# Patient Record
Sex: Female | Born: 1935 | Race: White | Hispanic: No | Smoking: Former smoker
Health system: Southern US, Community
[De-identification: ages and names within clinical notes are randomized; demographics above are authoritative.]

## PROBLEM LIST (undated history)

## (undated) DIAGNOSIS — I10 Essential (primary) hypertension: Secondary | ICD-10-CM

## (undated) DIAGNOSIS — E785 Hyperlipidemia, unspecified: Secondary | ICD-10-CM

## (undated) DIAGNOSIS — N390 Urinary tract infection, site not specified: Secondary | ICD-10-CM

## (undated) DIAGNOSIS — K219 Gastro-esophageal reflux disease without esophagitis: Secondary | ICD-10-CM

## (undated) DIAGNOSIS — E876 Hypokalemia: Secondary | ICD-10-CM

## (undated) DIAGNOSIS — M4804 Spinal stenosis, thoracic region: Secondary | ICD-10-CM

## (undated) DIAGNOSIS — I482 Chronic atrial fibrillation, unspecified: Secondary | ICD-10-CM

## (undated) DIAGNOSIS — R29898 Other symptoms and signs involving the musculoskeletal system: Secondary | ICD-10-CM

## (undated) DIAGNOSIS — K5900 Constipation, unspecified: Secondary | ICD-10-CM

## (undated) DIAGNOSIS — D62 Acute posthemorrhagic anemia: Secondary | ICD-10-CM

## (undated) DIAGNOSIS — L8990 Pressure ulcer of unspecified site, unspecified stage: Secondary | ICD-10-CM

## (undated) DIAGNOSIS — R600 Localized edema: Secondary | ICD-10-CM

## (undated) DIAGNOSIS — N3281 Overactive bladder: Secondary | ICD-10-CM

## (undated) DIAGNOSIS — M1990 Unspecified osteoarthritis, unspecified site: Secondary | ICD-10-CM

## (undated) DIAGNOSIS — E119 Type 2 diabetes mellitus without complications: Secondary | ICD-10-CM

## (undated) DIAGNOSIS — E46 Unspecified protein-calorie malnutrition: Secondary | ICD-10-CM

## (undated) HISTORY — PX: CATARACT EXTRACTION: SUR2

## (undated) HISTORY — PX: KNEE SURGERY: SHX244

## (undated) HISTORY — PX: BACK SURGERY: SHX140

## (undated) HISTORY — PX: HIP SURGERY: SHX245

## (undated) HISTORY — DX: Hypokalemia: E87.6

## (undated) HISTORY — DX: Overactive bladder: N32.81

## (undated) HISTORY — DX: Pressure ulcer of unspecified site, unspecified stage: L89.90

## (undated) HISTORY — PX: TONSILLECTOMY: SUR1361

## (undated) HISTORY — PX: CARPAL TUNNEL RELEASE: SHX101

## (undated) HISTORY — DX: Gastro-esophageal reflux disease without esophagitis: K21.9

## (undated) HISTORY — PX: WISDOM TOOTH EXTRACTION: SHX21

## (undated) HISTORY — PX: NECK SURGERY: SHX720

## (undated) HISTORY — DX: Constipation, unspecified: K59.00

## (undated) HISTORY — DX: Localized edema: R60.0

## (undated) HISTORY — DX: Unspecified osteoarthritis, unspecified site: M19.90

## (undated) HISTORY — DX: Hyperlipidemia, unspecified: E78.5

## (undated) HISTORY — DX: Spinal stenosis, thoracic region: M48.04

## (undated) HISTORY — DX: Urinary tract infection, site not specified: N39.0

## (undated) HISTORY — DX: Other symptoms and signs involving the musculoskeletal system: R29.898

## (undated) HISTORY — DX: Chronic atrial fibrillation, unspecified: I48.20

## (undated) HISTORY — DX: Acute posthemorrhagic anemia: D62

## (undated) HISTORY — DX: Type 2 diabetes mellitus without complications: E11.9

## (undated) HISTORY — PX: TOTAL HIP ARTHROPLASTY: SHX124

## (undated) HISTORY — DX: Unspecified protein-calorie malnutrition: E46

---

## 1998-03-11 ENCOUNTER — Other Ambulatory Visit: Admission: RE | Admit: 1998-03-11 | Discharge: 1998-03-11 | Payer: Self-pay | Admitting: Obstetrics and Gynecology

## 1998-05-20 ENCOUNTER — Ambulatory Visit (HOSPITAL_COMMUNITY): Admission: RE | Admit: 1998-05-20 | Discharge: 1998-05-20 | Payer: Self-pay | Admitting: Obstetrics & Gynecology

## 1998-07-30 ENCOUNTER — Encounter: Payer: Self-pay | Admitting: Family Medicine

## 1998-07-30 ENCOUNTER — Ambulatory Visit (HOSPITAL_COMMUNITY): Admission: RE | Admit: 1998-07-30 | Discharge: 1998-07-30 | Payer: Self-pay | Admitting: Family Medicine

## 1999-05-07 ENCOUNTER — Other Ambulatory Visit: Admission: RE | Admit: 1999-05-07 | Discharge: 1999-05-07 | Payer: Self-pay | Admitting: Obstetrics and Gynecology

## 2000-07-06 ENCOUNTER — Encounter: Payer: Self-pay | Admitting: Orthopaedic Surgery

## 2000-07-12 ENCOUNTER — Inpatient Hospital Stay (HOSPITAL_COMMUNITY): Admission: RE | Admit: 2000-07-12 | Discharge: 2000-07-16 | Payer: Self-pay | Admitting: Orthopaedic Surgery

## 2000-10-14 ENCOUNTER — Other Ambulatory Visit: Admission: RE | Admit: 2000-10-14 | Discharge: 2000-10-14 | Payer: Self-pay | Admitting: Obstetrics and Gynecology

## 2000-12-27 ENCOUNTER — Encounter: Payer: Self-pay | Admitting: Orthopaedic Surgery

## 2000-12-27 ENCOUNTER — Encounter: Admission: RE | Admit: 2000-12-27 | Discharge: 2000-12-27 | Payer: Self-pay | Admitting: Orthopaedic Surgery

## 2001-01-18 ENCOUNTER — Encounter: Admission: RE | Admit: 2001-01-18 | Discharge: 2001-01-18 | Payer: Self-pay | Admitting: Orthopaedic Surgery

## 2001-01-18 ENCOUNTER — Encounter: Payer: Self-pay | Admitting: Orthopaedic Surgery

## 2001-02-01 ENCOUNTER — Encounter: Payer: Self-pay | Admitting: Orthopaedic Surgery

## 2001-02-01 ENCOUNTER — Encounter: Admission: RE | Admit: 2001-02-01 | Discharge: 2001-02-01 | Payer: Self-pay | Admitting: Orthopaedic Surgery

## 2001-04-18 ENCOUNTER — Ambulatory Visit (HOSPITAL_COMMUNITY): Admission: RE | Admit: 2001-04-18 | Discharge: 2001-04-18 | Payer: Self-pay | Admitting: Obstetrics and Gynecology

## 2001-04-18 ENCOUNTER — Encounter (INDEPENDENT_AMBULATORY_CARE_PROVIDER_SITE_OTHER): Payer: Self-pay

## 2001-07-04 ENCOUNTER — Encounter: Payer: Self-pay | Admitting: Orthopedic Surgery

## 2001-07-11 ENCOUNTER — Encounter: Payer: Self-pay | Admitting: Orthopedic Surgery

## 2001-07-11 ENCOUNTER — Inpatient Hospital Stay (HOSPITAL_COMMUNITY): Admission: RE | Admit: 2001-07-11 | Discharge: 2001-07-17 | Payer: Self-pay | Admitting: Orthopedic Surgery

## 2002-03-02 ENCOUNTER — Other Ambulatory Visit: Admission: RE | Admit: 2002-03-02 | Discharge: 2002-03-02 | Payer: Self-pay | Admitting: Obstetrics and Gynecology

## 2003-04-22 ENCOUNTER — Other Ambulatory Visit: Admission: RE | Admit: 2003-04-22 | Discharge: 2003-04-22 | Payer: Self-pay | Admitting: Obstetrics and Gynecology

## 2004-04-01 ENCOUNTER — Encounter: Admission: RE | Admit: 2004-04-01 | Discharge: 2004-04-02 | Payer: Self-pay | Admitting: Family Medicine

## 2005-12-08 ENCOUNTER — Encounter: Admission: RE | Admit: 2005-12-08 | Discharge: 2005-12-08 | Payer: Self-pay | Admitting: Specialist

## 2006-10-26 ENCOUNTER — Inpatient Hospital Stay (HOSPITAL_COMMUNITY): Admission: RE | Admit: 2006-10-26 | Discharge: 2006-10-30 | Payer: Self-pay | Admitting: Orthopedic Surgery

## 2006-12-10 ENCOUNTER — Inpatient Hospital Stay (HOSPITAL_COMMUNITY): Admission: EM | Admit: 2006-12-10 | Discharge: 2006-12-11 | Payer: Self-pay | Admitting: Emergency Medicine

## 2007-10-25 ENCOUNTER — Inpatient Hospital Stay (HOSPITAL_COMMUNITY): Admission: RE | Admit: 2007-10-25 | Discharge: 2007-10-29 | Payer: Self-pay | Admitting: Orthopedic Surgery

## 2008-01-01 ENCOUNTER — Ambulatory Visit (HOSPITAL_COMMUNITY): Admission: EM | Admit: 2008-01-01 | Discharge: 2008-01-01 | Payer: Self-pay | Admitting: Emergency Medicine

## 2008-02-28 ENCOUNTER — Ambulatory Visit (HOSPITAL_BASED_OUTPATIENT_CLINIC_OR_DEPARTMENT_OTHER): Admission: RE | Admit: 2008-02-28 | Discharge: 2008-02-28 | Payer: Self-pay | Admitting: *Deleted

## 2008-08-31 ENCOUNTER — Emergency Department (HOSPITAL_COMMUNITY): Admission: EM | Admit: 2008-08-31 | Discharge: 2008-08-31 | Payer: Self-pay | Admitting: Emergency Medicine

## 2008-10-28 ENCOUNTER — Inpatient Hospital Stay (HOSPITAL_COMMUNITY): Admission: RE | Admit: 2008-10-28 | Discharge: 2008-10-31 | Payer: Self-pay | Admitting: Orthopedic Surgery

## 2008-12-31 ENCOUNTER — Ambulatory Visit (HOSPITAL_COMMUNITY): Admission: RE | Admit: 2008-12-31 | Discharge: 2008-12-31 | Payer: Self-pay | Admitting: Orthopedic Surgery

## 2008-12-31 ENCOUNTER — Encounter (INDEPENDENT_AMBULATORY_CARE_PROVIDER_SITE_OTHER): Payer: Self-pay | Admitting: Orthopedic Surgery

## 2008-12-31 ENCOUNTER — Ambulatory Visit: Payer: Self-pay | Admitting: Vascular Surgery

## 2010-01-28 ENCOUNTER — Inpatient Hospital Stay (HOSPITAL_COMMUNITY): Admission: RE | Admit: 2010-01-28 | Discharge: 2010-01-30 | Payer: Self-pay | Admitting: Orthopedic Surgery

## 2010-02-02 ENCOUNTER — Encounter: Payer: Self-pay | Admitting: Internal Medicine

## 2010-07-28 NOTE — Letter (Signed)
Summary: Colonoscopy Letter  [facility]  [address] [place], [place] [address]   Phone: [phone]  Fax: [phone]      February 02, 2010 MRN: [id number]   Latalia Detwiler [address] [place], [place]  [address]   Dear Ms. Sedalia Muta,   According to your medical record, it is time for you to schedule a Colonoscopy. The American Cancer Society recommends this procedure as a method to detect early colon cancer. Patients with a family history of colon cancer, or a personal history of colon polyps or inflammatory bowel disease are at increased risk.  This letter has been generated based on the recommendations made at the time of your procedure. If you feel that in your particular situation this may no longer apply, please contact our office.  Please call our office at [phone] to schedule this appointment or to update your records at your earliest convenience.  Thank you for cooperating with [place] to provide you with the very best care possible.   Sincerely,  Hedwig Morton. Juanda Chance, M.D.  [facility] [phone]

## 2010-09-11 LAB — BASIC METABOLIC PANEL
CO2: 30 mEq/L (ref 19–32)
Chloride: 105 mEq/L (ref 96–112)
Creatinine, Ser: 1.08 mg/dL (ref 0.4–1.2)
GFR calc non Af Amer: 50 mL/min — ABNORMAL LOW (ref >60)
Glucose, Bld: 121 mg/dL — ABNORMAL HIGH (ref 70–99)
Sodium: 141 mEq/L (ref 135–145)

## 2010-09-11 LAB — CBC
MCH: 32.1 pg (ref 26.0–34.0)
MCHC: 34.2 g/dL (ref 30.0–36.0)
Platelets: 186 10*3/uL (ref 150–400)
WBC: 6.6 10*3/uL (ref 4.0–10.5)

## 2010-09-12 LAB — SURGICAL PCR SCREEN
MRSA, PCR: NEGATIVE
Staphylococcus aureus: NEGATIVE

## 2010-09-12 LAB — PROTIME-INR: Prothrombin Time: 13.7 seconds (ref 11.6–15.2)

## 2010-09-12 LAB — COMPREHENSIVE METABOLIC PANEL
Albumin: 3.8 g/dL (ref 3.5–5.2)
Alkaline Phosphatase: 65 U/L (ref 39–117)
Chloride: 107 mEq/L (ref 96–112)
GFR calc non Af Amer: 44 mL/min — ABNORMAL LOW (ref >60)
Glucose, Bld: 136 mg/dL — ABNORMAL HIGH (ref 70–99)
Potassium: 4.4 mEq/L (ref 3.5–5.1)
Total Bilirubin: 1.1 mg/dL (ref 0.3–1.2)

## 2010-09-12 LAB — URINALYSIS, ROUTINE W REFLEX MICROSCOPIC
Bilirubin Urine: NEGATIVE
Hgb urine dipstick: NEGATIVE
Ketones, ur: NEGATIVE mg/dL
Urobilinogen, UA: 0.2 mg/dL (ref 0.0–1.0)

## 2010-09-12 LAB — DIFFERENTIAL
Eosinophils Relative: 3 % (ref 0–5)
Lymphocytes Relative: 23 % (ref 12–46)
Lymphs Abs: 1.3 10*3/uL (ref 0.7–4.0)
Monocytes Absolute: 0.5 10*3/uL (ref 0.1–1.0)

## 2010-09-12 LAB — APTT: aPTT: 29 seconds (ref 24–37)

## 2010-09-12 LAB — CBC
MCH: 32.2 pg (ref 26.0–34.0)
MCHC: 34.6 g/dL (ref 30.0–36.0)
MCV: 93 fL (ref 78.0–100.0)
Platelets: 223 10*3/uL (ref 150–400)
RBC: 4.3 MIL/uL (ref 3.87–5.11)
RDW: 13.9 % (ref 11.5–15.5)

## 2010-09-12 LAB — URINE MICROSCOPIC-ADD ON

## 2010-10-06 LAB — CBC
HCT: 34.6 % — ABNORMAL LOW (ref 36.0–46.0)
HCT: 35.5 % — ABNORMAL LOW (ref 36.0–46.0)
Hemoglobin: 11.7 g/dL — ABNORMAL LOW (ref 12.0–15.0)
Hemoglobin: 12 g/dL (ref 12.0–15.0)
MCHC: 33.9 g/dL (ref 30.0–36.0)
MCV: 94.2 fL (ref 78.0–100.0)
MCV: 94.3 fL (ref 78.0–100.0)
Platelets: 186 10*3/uL (ref 150–400)
Platelets: 189 10*3/uL (ref 150–400)
Platelets: 213 10*3/uL (ref 150–400)
RBC: 3.51 MIL/uL — ABNORMAL LOW (ref 3.87–5.11)
RDW: 13.8 % (ref 11.5–15.5)
RDW: 13.9 % (ref 11.5–15.5)
WBC: 7.7 10*3/uL (ref 4.0–10.5)
WBC: 9.3 10*3/uL (ref 4.0–10.5)

## 2010-10-06 LAB — BASIC METABOLIC PANEL
BUN: 14 mg/dL (ref 6–23)
BUN: 16 mg/dL (ref 6–23)
CO2: 32 mEq/L (ref 19–32)
Calcium: 8.4 mg/dL (ref 8.4–10.5)
Chloride: 100 mEq/L (ref 96–112)
GFR calc non Af Amer: 47 mL/min — ABNORMAL LOW (ref >60)
GFR calc non Af Amer: 49 mL/min — ABNORMAL LOW (ref >60)
Glucose, Bld: 136 mg/dL — ABNORMAL HIGH (ref 70–99)
Potassium: 4.8 mEq/L (ref 3.5–5.1)
Potassium: 4.8 mEq/L (ref 3.5–5.1)
Sodium: 135 mEq/L (ref 135–145)

## 2010-10-06 LAB — TYPE AND SCREEN: Donor AG Type: NEGATIVE

## 2010-10-06 LAB — URINALYSIS, ROUTINE W REFLEX MICROSCOPIC
Nitrite: NEGATIVE
Specific Gravity, Urine: 1.014 (ref 1.005–1.030)
Urobilinogen, UA: 0.2 mg/dL (ref 0.0–1.0)
pH: 5.5 (ref 5.0–8.0)

## 2010-10-06 LAB — PROTIME-INR
INR: 1.4 (ref 0.00–1.49)
Prothrombin Time: 19.7 seconds — ABNORMAL HIGH (ref 11.6–15.2)

## 2010-10-06 LAB — URINE CULTURE: Culture: NO GROWTH

## 2010-10-07 LAB — APTT: aPTT: 29 seconds (ref 24–37)

## 2010-10-07 LAB — COMPREHENSIVE METABOLIC PANEL
AST: 23 U/L (ref 0–37)
Albumin: 3.9 g/dL (ref 3.5–5.2)
BUN: 23 mg/dL (ref 6–23)
Calcium: 9.4 mg/dL (ref 8.4–10.5)
Chloride: 107 mEq/L (ref 96–112)
Creatinine, Ser: 1.01 mg/dL (ref 0.4–1.2)
GFR calc Af Amer: >60 mL/min (ref >60)
Total Bilirubin: 0.9 mg/dL (ref 0.3–1.2)

## 2010-10-07 LAB — CBC
HCT: 40.1 % (ref 36.0–46.0)
MCHC: 34.2 g/dL (ref 30.0–36.0)
MCV: 92.8 fL (ref 78.0–100.0)
Platelets: 251 10*3/uL (ref 150–400)
RDW: 14.1 % (ref 11.5–15.5)

## 2010-10-07 LAB — URINALYSIS, ROUTINE W REFLEX MICROSCOPIC
Hgb urine dipstick: NEGATIVE
Nitrite: POSITIVE — AB
Protein, ur: NEGATIVE mg/dL
Specific Gravity, Urine: 1.021 (ref 1.005–1.030)
Urobilinogen, UA: 1 mg/dL (ref 0.0–1.0)

## 2010-10-07 LAB — URINE MICROSCOPIC-ADD ON

## 2010-10-07 LAB — PROTIME-INR: INR: 1 (ref 0.00–1.49)

## 2010-11-10 NOTE — H&P (Signed)
NAME:  [name] [name], [name] [name]                    ACCOUNT NO.:  [id number]   MEDICAL RECORD NO.:  [id number]          PATIENT TYPE:  INP   LOCATION:  0006                         FACILITY:  [facility]   PHYSICIAN:  Ollen Gross, M.D.    DATE OF BIRTH:  Mar 01, 1934   DATE OF ADMISSION:  10/28/2008  DATE OF DISCHARGE:                              HISTORY & PHYSICAL   CHIEF COMPLAINT:  Left hip instability.   HISTORY OF PRESENT ILLNESS:  The patient is a 77 year old female well-  known by Dr. Ollen Gross having previously undergone a left total hip.  Unfortunately, she has sustained several dislocations, most recently her  third dislocation.  It is felt that something needed to be done  surgically.  Options included revision versus conversion over  constraint, latter risks and benefits have been discussed.  He elected  to proceed with surgery.   ALLERGIES:  NO KNOWN DRUG ALLERGIES.   INTOLERANCES:  MORPHINE HALLUCINATION   CURRENT MEDICATIONS:  Ziac, Micardis, Vesicare, Cataflam, vitamin D,  Lescol, fish oil, Centrum Silver, Caltrate, Zantac.   PAST MEDICAL HISTORY:  1. Hypertension.  2. Hiatal hernia.  3. Varicose veins.  4. History of urinary incontinence.  5. History of urinary tract infections.  6. History of anemia.   PAST SURGICAL HISTORY:  1. Right total knee replacement.  2. Right total hip replacement.  3. Left total hip replacement.  4. Unfortunately, she has undergone three dislocations requiring      reductions and also spinal fusion.   SOCIAL HISTORY:  Divorced, retired Veterinary surgeon, past smoker, infrequent  intake of alcohol.  Three children.  Lives alone.  Two steps entering  her home.   FAMILY HISTORY:  Father with history of colon cancer.  Mother with  history of congestive heart failure.   REVIEW OF SYSTEMS:  GENERAL:  No fevers, chills, night sweats.  NEUROLOGICAL:  No seizure, syncope or paralysis.  RESPIRATORY:  A little  bit of shortness of breath on exertion.  No  shortness of breath at rest,  productive cough or hemoptysis.  CARDIOVASCULAR:  No chest pain, angina,  orthopnea.  GI:  No nausea, vomiting, diarrhea or constipation.  GU:  A  little bit of incontinence.  No dysuria, hematuria.  MUSCULOSKELETAL:  Left hip.   PHYSICAL EXAMINATION:  VITAL SIGNS:  Pulse 64, respirations 14, blood  pressure 126/60.  GENERAL:  A 77 year old  white female well-nourished, well-developed,  slightly overweight, no acute distress.  She is alert, oriented and  cooperative.  HEENT:  Normocephalic, atraumatic.  Pupils round and reactive.  Oropharynx clear.  EOMs intact.  NECK:  Supple.  CHEST:  Clear anterior and posterior chest walls.  HEART:  Regular rate and rhythm.  No murmur.  ABDOMEN:  Soft, round, bowel sounds present.  RECTAL/BREASTS/GENITALIA:  Not done, not pertinent to present illness.  EXTREMITIES:  Left hip flexion 100, internal rotation 20, external  rotation 30, abduction 30.   IMPRESSION:  Left hip instability.   PLAN:  The patient admitted to [facility] to undergo a left  total hip revision  versus constraint liner.      Alexzandrew L. Perkins, P.A.C.      Ollen Gross, M.D.  Electronically Signed    ALP/MEDQ  D:  10/28/2008  T:  10/28/2008  Job:  [id number]   cc:   Ollen Gross, M.D.  Fax: [phone]   Anna Genre Little, M.D.  Fax: [phone]

## 2010-11-10 NOTE — Discharge Summary (Signed)
NAME:  [name] [name], [name] [name]                    ACCOUNT NO.:  [id number]   MEDICAL RECORD NO.:  [id number]          PATIENT TYPE:  INP   LOCATION:  1606                         [facility]ACILITY:  [facility]   PHYSICIAN:  Ollen Gross, M.D.    DATE OF BIRTH:  02/02/1934   DATE OF ADMISSION:  10/28/2008  DATE OF DISCHARGE:  10/31/2008                               DISCHARGE SUMMARY   ADMITTING DIAGNOSES:  1. Left hip instability.  2. Hypertension.  3. Hiatal hernia.  4. Varicose veins.  5. History of urinary incontinence.  6. History of urinary tract infection infections.  7. History of anemia.   DISCHARGE DIAGNOSES:  1. Unstable left total hip arthroplasty status post left acetabular      revision conversion to a constrained liner.  2. Hypertension.  3. Hiatal hernia.  4. Varicose veins.  5. History of urinary incontinence.  6. History of urinary tract infection infections.  7. History of anemia.  8. Postop atelectasis.   PROCEDURE:  Oct 28, 2008 revision and conversion of the left total hip  over to a constrained liner.  Surgeon Dr. Lequita Halt, assistant Avel Peace PA-C.  Anesthesia was general.   CONSULTS:  None.   BRIEF HISTORY:  Ms. Titterington is a 77 year old female with left total hip done  a couple years ago.  Unfortunately she has had 3 episodes where she has  dislocated.  Her components appear to be in good position on radiograph.  She has done  physical therapy between dislocations, but despite this  she has had recurrences.  She is at a point now where she would like to  have the prosthesis revised.   LABORATORY DATA:  Preop CBC showed hemoglobin 13.7, hematocrit 40.1,  white cell count 9.0, platelets 252.  PT/INR 13.3/1.0 with PTT of 29.  Chem panel on admission all within normal limits.  Preop UA:  Small  bili, trace ketones, positive nitrite, small leukocyte esterase, 7-10  white cells, many bacteria.  This was treated preoperatively.  Serial  CBCs were followed.  Hemoglobin dropped down  to 12, then 11.7.  Last  known 11.2 and 33.1.  Serial protimes followed per Coumadin protocol.  Last known PT/INR 19.7 and 1.6.  Serial BMETs were followed.  Electrolytes remained within normal limits.  We did a follow-up UA on  Oct 29, 2008.  Follow-up UA was negative, and the urine culture was  negative, no growth.   Chest x-ray Oct 29, 2008:  Low lung volumes with minimal left base  atelectasis, probable hiatal hernia.  Increase may be due to decreased  lung volumes.   HOSPITAL COURSE:  The patient admitted to [facility].  Taken  to OR, underwent above-stated procedure without complication.  The  patient tolerated the procedure well, later transferred to recovery room  and orthopedic floor. Started on PCA and p.o. analgesics.  Doing pretty  well on morning of day #1  on rounds.  We allowed her to be  weightbearing as tolerated since it was just conversion of the liner to  constrained.  Allowing weightbearing as  tolerated,  she was able to do  well with physical therapy.  She had a little bit of low pressures,  asymptomatic hypotension, so we put her blood pressure medications on  parameters.  She did have a preop UTI which was treated preoperatively,  so we repeated the UA which was found to be negative.  There was no  growth on the urinalysis.  She did run some elevated temps on that  afternoon.  We did check chest x-ray which did show just some minimal  left base atelectasis, but her follow-up UA was negative.  For the  postop atelectasis we did use antipyretics in the form of Tylenol and  also incentive spirometer.  Encouraged to use her IS.  On day #2 she was  doing better.  Temperature was back down, and she had her dressing  changed.  Incision looked good.  We DC'd the fluids and encouraged p.o.  intake.  Output was good.  She started walking a little bit better over  100 feet, and by the morning of day #3 she was doing well, tolerating  her meds, and discharged home.   Her INR was almost therapeutic.  It was  increasing daily, and we gave her 1 dose of Lovenox.   DISCHARGE PLAN:  1. The patient was discharged home on Oct 31, 2008.  2. Discharge diagnoses, please see above.  3. Discharge meds:  Coumadin, Percocet, Robaxin, 1 dose of Lovenox in      the hospital prior to discharge.   FOLLOW UP:  She needs to follow up next Thursday on May 13 with Dr.  Lequita Halt.  Call the office for an appointment.   DIET:  Low-sodium, heart-healthy diet.   ACTIVITY:  She is weightbearing as tolerated.  Home health PT, home  health nursing.   DISPOSITION:  Home.   CONDITION ON DISCHARGE:  Improved.      Alexzandrew L. Perkins, P.A.C.      Ollen Gross, M.D.  Electronically Signed    ALP/MEDQ  D:  10/31/2008  T:  10/31/2008  Job:  [id number]   cc:   Caryn Bee L. Little, M.D.  Fax: [phone]

## 2010-11-10 NOTE — Op Note (Signed)
NAME:  [name] [name], [name] [name]                    ACCOUNT NO.:  [id number]   MEDICAL RECORD NO.:  [id number]          PATIENT TYPE:  INP   LOCATION:  0006                         FACILITY:  [facility]   PHYSICIAN:  Ollen Gross, M.D.    DATE OF BIRTH:  1933/07/28   DATE OF PROCEDURE:  10/28/2008  DATE OF DISCHARGE:                               OPERATIVE REPORT   PREOPERATIVE DIAGNOSIS:  Unstable left total hip arthroplasty.   POSTOPERATIVE DIAGNOSIS:  Unstable left total hip arthroplasty.   PROCEDURE:  Left acetabular revision to constrained liner.   SURGEON:  Ollen Gross, M.D.   ASSISTANT:  Avel Peace, PA-C   ANESTHESIA:  General.   BLOOD LOSS:  100   DRAIN:  Hemovac x1.   COMPLICATIONS:  None.   CONDITION:  Stable to the recovery room.   CLINICAL NOTE:  [name] [name] is a 77 year old female who had a left total hip  arthroplasty done a couple of years ago.  She unfortunately has had  three episodes where she has dislocated.  Her components all appear to  be in good position.  She has done physical therapy in between the  dislocations and despite that had the recurrence.  She is at a stage now  where we need to revise this to prevent further dislocation in the  future.  We discussed options and decided that we would most likely go  with constrained liner depending on intraoperative findings.   PROCEDURE IN DETAIL:  After successful administration of general  anesthetic, the patient was placed in the right lateral decubitus  position with the left side up and held with the hip positioner.  The  left lower extremity was isolated from her perineum with plastic drapes  and prepped and draped in the usual sterile fashion.  Previous  posterolateral incision was used.  Skin cut with 10 blade through  subcutaneous tissue to the level of fascia lata which was incised in  line with the skin incision.  Sciatic nerve was palpated and protected  and short external rotators isolated off the femur.   Capsulectomy was  then performed.  I placed the hip through range of motion.  She was  dislocating at 70 degrees flexion, 40 degrees adduction and about 60  degrees of internal rotation.  At 90 degrees of flexion, she dislocated  at about 30 degrees of internal rotation.  I then dislocated the hip and  took off the femoral head.  The femoral stem has excellent anteversion.  She is about 25 degrees which is matching her native anteversion.  I  then translated the femur anteriorly and placed the acetabular  retractors to gain acetabular exposure.  The acetabular component and  alignment was anatomic.  She had a neutral liner.  I obtained  circumferential exposure of the acetabulum and then removed the liner  with the extraction device.  She had a size 56 Pinnacle cup in place.  I  was happy with the position of both the acetabular component and the  femoral component.  Both were well-fixed.  Given that change of  either  of the two components would most likely not add a tremendous amount to  her stability, decided to place a constrained liner and make this a  constrained system per our preop discussion.  I then placed a 56 mm x 36  constrained liner into the acetabular shell and impacted it.  We placed  a 36 + 0 head and reduced the hip.  We then placed the locking ring and  impacted it around the liner.  It then showed good circumferential  placement.  I placed the hip through a range of motion and she really  has fantastic stability with full extension, flexion and rotation, 70  degrees flexion, 40 degrees adduction, 90 degrees internal rotation and  90 degrees flexion and 70 degrees of internal rotation.  The hip was not  dislocating through any of these modes.  We then thoroughly irrigated  and reattached the posterior soft tissue structures to the femur through  drill holes with Ethibond suture.  The fascia lata was closed over a  Hemovac drain with interrupted #1 Vicryl, subcu closed  with #1 and then  2-0 Vicryl and skin with staples.  Drains hooked to suction.  Incision  cleaned and dried and a bulky sterile dressing applied.  She was then  awakened and transported to recovery in stable condition.      Ollen Gross, M.D.  Electronically Signed     FA/MEDQ  D:  10/28/2008  T:  10/28/2008  Job:  [id number]

## 2010-11-10 NOTE — Op Note (Signed)
NAME:  [name] [name], [name] [name]                    ACCOUNT NO.:  [id number]   MEDICAL RECORD NO.:  [id number]          PATIENT TYPE:  INP   LOCATION:  1616                         FACILITY:  [facility]   PHYSICIAN:  Alvy Beal, MD    DATE OF BIRTH:  10/26/1933   DATE OF PROCEDURE:  12/10/2006  DATE OF DISCHARGE:                               OPERATIVE REPORT   PREOPERATIVE DIAGNOSIS:  Left total hip prosthesis dislocation.   POSTOPERATIVE DIAGNOSIS:  Left total hip prosthesis dislocation.   OPERATIVE PROCEDURE:  Closed reduction of left hip dislocation.   SURGEON:  Dahari D. Brooks, MD.   HISTORY:  This is a very pleasant 77 year old just six weeks out from a  left total hip operation.  She bent over today to pick up a box of  Kleenex, heard a pop, and dislocated her hip.  She presented to the ER,  and I was consulted to help.  After discussing treatment options, we  elected to take her to the operating room where we could provide  adequate anesthesia and muscle relaxation for a closed reduction.  All  appropriate risks, benefits, and alternatives were explained to the  patient, consent was obtained.   After marking the appropriate side, the patient was anesthetized and an  LMA was inserted.  With appropriate muscle relaxation, I could easily  reduce the hip.  When the hip was brought up into 90 degrees of flexion,  it did redislocated out the posterior aspect.  I then relocated the hip,  extended it, the limb lengths were equal, she had excellent distal  pulses.  I then placed a knee immobilizer and a hip abduction pillow.  AP x-rays demonstrated that I had adequate reduction.  The lateral was  satisfactory.  The lateral x-rays were difficult because of the  patient's body habitus, but they did appear to be satisfactory.  Repeat  films will be taken in the PACU to get better visualization on the  lateral.   The patient tolerated the closed reduction without difficulty, she was  extubated and  transferred to the PACU without incident.      Alvy Beal, MD  Electronically Signed     DDB/MEDQ  D:  12/10/2006  T:  12/11/2006  Job:  [id number]

## 2010-11-10 NOTE — H&P (Signed)
NAME:  Mcwhirter, Ja                    ACCOUNT NO.:  [id number]   MEDICAL RECORD NO.:  [id number]          PATIENT TYPE:  INP   LOCATION:  1616                         [facility]ACILITY:  [facility]   PHYSICIAN:  Alvy Beal, MD    DATE OF BIRTH:  1933/10/15   DATE OF ADMISSION:  12/10/2006  DATE OF DISCHARGE:                              HISTORY & PHYSICAL   Ms. Pottle is a very pleasant 77 year old woman who is now just about 6  weeks out from a left total hip replacement by Dr. Trudee Grip.  She  was in her usual state of good health, recovering, with left pain, until  this morning when she was getting dressed to go out.  She bent over to  pick up a box of Kleenex and felt a pop in her left hip.  She had  immediate pain and was brought to the hospital by EMS.  X-rays in the  emergency department here at [facility] demonstrated a left total hip  prosthesis dislocation.  As a result, I was contacted for definitive  management.   The patient currently is admitted to the orthopedic floor, she is  comfortable with a PCA.   PAST MEDICAL HISTORY:  Significant for hypertension.  She is a  nonsmoker, nondrinker, no significant medical problems other than the  hypertension and left hip dislocation.  In 2003, she had a right total  hip which has been asymptomatic for her.   CLINICAL EXAM:  She is a pleasant woman who appears her stated age, in  no acute distress.  She is alert, oriented x3.  She has intact dorsalis  pedis and posterior tibialis pulses.  Tibialis anterior, EHL and  gastrocnemius are 5/5, sensation to light touch is intact in the lower  extremity.  She has no significant calf pain.  ABDOMEN:  Soft and nontender, right lower extremity is asymptomatic with  full range of motion.   X-rays do demonstrate a posterior superior dislocation of the total hip  prosthesis.  There is no apparent fracture noted at the level of the  prosthesis or in the distal femur.   PLAN AT THIS POINT IN TIME:   In order to decrease her pain and lessen  the trauma to the prosthesis, I informed her that I think the best  course of action is taking her to the operating room where we can  appropriately sedate her for an atraumatic reduction.  She is in full  agreement with this.  In the meantime, we have admitted her to the  floor.  She has a PCA for pain control and a Foley.  All appropriate,  risks, benefits and alternatives were explained to her.  The plan will  be to take her to the operating room later on this evening for a closed  reduction and then possible discharge in the morning.      Alvy Beal, MD  Electronically Signed     DDB/MEDQ  D:  12/10/2006  T:  12/11/2006  Job:  [id number]   cc:   Ollen Gross, M.D.  Fax: [phone]

## 2010-11-10 NOTE — Op Note (Signed)
NAME:  [name] [name], [name] [name]                    ACCOUNT NO.:  [id number]   MEDICAL RECORD NO.:  [id number]          PATIENT TYPE:  INP   LOCATION:  0002                         FACILITY:  [facility]   PHYSICIAN:  Ollen Gross, M.D.    DATE OF BIRTH:  1933-12-15   DATE OF PROCEDURE:  10/25/2007  DATE OF DISCHARGE:                               OPERATIVE REPORT   PREOPERATIVE DIAGNOSIS:  Osteoarthritis, left knee.   POSTOPERATIVE DIAGNOSIS:  Osteoarthritis, left knee.   OPERATION PERFORMED:  Left total knee arthroplasty.   SURGEON:  Ollen Gross, M.D.   ASSISTANT:  Alexzandrew L. Perkins, P.A.C.   ANESTHESIA:  General with postoperative Marcaine pain pump.   ESTIMATED BLOOD LOSS:  Minimal.   DRAINS:  None.   TOURNIQUET TIME:  41 minutes at 300 mmHg.   COMPLICATIONS:  None.   CONDITION:  Stable to recovery.   INDICATIONS FOR PROCEDURE:  Ms. Mallis is a 77 year old female with severe  end-stage arthritis of the left knee with progressively worsening pain  and dysfunction.  She has failed nonoperative management and presents  now for total knee arthroplasty.   DESCRIPTION OF PROCEDURE:  After successful administration of general  anesthetic, a tourniquet was placed high on the left thigh and the left  lower extremity prepped and draped in the usual sterile fashion.  Extremity was wrapped with an Esmarch, knee flexed, tourniquet inflated  to 300 mmHg.  Midline incision was made with a 10 blade through  subcutaneous tissue to the level of the extensor mechanism.  A fresh  blade was used to make a medial parapatellar arthrotomy.  The soft  tissue over the proximal and medial tibia subperiosteally elevated to  the joint line with a knife and into the semimembranosus bursa with a  Cobb elevator.  The soft tissue laterally was elevated with attention  being paid to avoiding the patellar tendon on the tibial tubercle.  The  patella was subluxed laterally, the knee flexed 90 degrees and ACL and  PCL  were removed.  Drill was used to create a starting hole in the  distal femur and the canal was thoroughly irrigated.  A 5 degree left  valgus alignment guide was placed referencing off the posterior  condyles.  Rotations marked and the block pinned to remove 11 mm off the  distal femur.  I took 11 because of preoperative flexion contraction.  Distal femoral resection was made with an oscillating saw.  The sizing  guide was placed and a size 3 was most appropriate.  Rotation was marked  at the epicondylar axis.  Size 3 cutting block was placed and the  anterior, posterior and chamfer cuts made.   The tibia subluxed forward and menisci removed.  The extramedullary  tibial alignment guide placed referencing proximally at the medial  aspect of the tibial tubercle and distally on the second metatarsal axis  and tibial crest.  The block was pinned to remove about 4 mm off the  more deficient lateral side.  Tibial resection was made with an  oscillating saw.  Size 3 was  the most appropriate tibial component and  the proximal tibia was prepared with the modular drill and keel punch  for a size 3.  Femoral preparation was completed with the intercondylar  cut.   Size 3 mobile bearing trial, size 3 posterior stabilized femoral trial,  a 10 mm posterior stabilized rotating platform insert trial were placed.  With the 10, full extension was achieved with excellent varus and  valgus, anterior and posterior balance throughout full range of motion.  The patella was then everted and thickness measured to be 21 mm.  Free  hand resection taken to 12 mm, a 35 template was placed, lug holes were  drilled, trial patella was placed and it tracked normally.  Osteophytes  were removed off the posterior femur with the trial in place.  All  trials were removed and the cut bone surfaces were prepared with  pulsatile lavage.  Cement was mixed and once ready for implantation, the  size 3 mobile bearing tibial tray,  size 3 posterior stabilized femur and  35 patella were cemented into place.  The patella was held with the  clamp.  Trial 10 mm insert was placed, knee held in full extension, all  extruded cement removed.  When the cement was fully hardened, then the  permanent 10 mm posterior stabilized rotating platform insert was placed  into the tibial tray.  The wound was copiously irrigated with saline  solution.  Floseal was placed on the posterior capsule, medial and  lateral gutters and suprapatellar area.  Moist sponge was placed and the  tourniquet released with a total time of 41 minutes.  The sponge was  held for two minutes, then removed.  Minimal bleeding was encountered.  That which was encountered was stopped with electrocautery.  The wound  was then further irrigated with saline solution and the extensor  mechanism closed with interrupted #1 PDS.  Flexion against gravity 135  degrees.  Subcu was closed with interrupted 2-0 Vicryl and subcuticular  running 4-0 Monocryl.  The incision was cleaned and dried and Steri-  Strips  and a bulky sterile dressing applied.  She was placed into a  knee immobilizer, awakened and transported to the recovery room in  stable condition.      Ollen Gross, M.D.  Electronically Signed     FA/MEDQ  D:  10/25/2007  T:  10/25/2007  Job:  [id number]

## 2010-11-10 NOTE — Op Note (Signed)
NAME:  [name] [name], [name] [name]                    ACCOUNT NO.:  [id number]   MEDICAL RECORD NO.:  [id number]          PATIENT TYPE:  OBV   LOCATION:  0098                         FACILITY:  [facility]   PHYSICIAN:  Madlyn Frankel. Charlann Boxer, M.D.  DATE OF BIRTH:  1934/02/04   DATE OF PROCEDURE:  01/01/2008  DATE OF DISCHARGE:                               OPERATIVE REPORT   PREOPERATIVE DIAGNOSIS:  Dislocated left total hip replacement.   POSTOPERATIVE DIAGNOSIS:  Dislocated left total hip replacement.   PROCEDURE:  Closed reduction of a left total hip replacement.   SURGEON:  Madlyn Frankel. Charlann Boxer, M.D.   ASSISTANT:  None.   ANESTHESIA:  General LMA.   COMPLICATIONS:  None.   INDICATIONS FOR PROCEDURE:  [name] is a 77 year old female with a  history of hip replacement in April 2008.  She had dislocated it  initially in June 2008.  Today, she bent over to pick up some shoes when  she felt her hip pop out.  She had immediate onset of pain and leg  length discrepancy.  She was brought to the emergency room where  radiographs revealed superior dislocation.  She was initially seen and  evaluated by Dr. Ranell Patrick' PA, Alphonsa Overall.  Brad Dixon filled out history  and physical.  I saw the patient and reviewed the radiographs on the  system.  Risks and benefits were reviewed as she had been through this  before in June 2008.  Consent was obtained with plans for her to follow  up with her surgeon, Dr. Lequita Halt in 2-3 weeks.  Consent obtained.   PROCEDURE IN DETAIL:  The patient was brought to the operative theatre.  Once adequate anesthesia was established, pressure was applied on the  pelvis, particularly on the left side.  I then flexed the hip,  internally rotated and applied traction towards the ceiling.  Then with  external rotation, reduced the hip with an audible palpable reduction.  Legs lengths returned to normal with normal neutral liner on the lower  extremity compared to the right.   She was neurovascularly  intact before the procedure, as well as  postoperatively.  She tolerated hip range of motion with hip flexion  about 80 degrees, internal rotation about 20, external about 40 degrees  without any sense of any instability.  I did not redislocate in this  attempt.   She was placed in a knee immobilizer and awakened from anesthesia.  Plain film x-rays were obtained.  She was brought to the recovery room  in stable condition having tolerated the procedure well.      Madlyn Frankel Charlann Boxer, M.D.  Electronically Signed     MDO/MEDQ  D:  01/01/2008  T:  01/01/2008  Job:  [id number]

## 2010-11-10 NOTE — H&P (Signed)
NAME:  [name] [name]                    ACCOUNT NO.:  [id number]   MEDICAL RECORD NO.:  [id number]          PATIENT TYPE:  INP   LOCATION:  1522                         FACILITY:  [facility]   PHYSICIAN:  Ollen Gross, M.D.    DATE OF BIRTH:  1933-12-22   DATE OF ADMISSION:  10/25/2007  DATE OF DISCHARGE:                              HISTORY & PHYSICAL   DATE OF OFFICE VISIT HISTORY AND PHYSICAL:  September 29, 2007   CHIEF COMPLAINT:  Left knee pain.   HISTORY OF PRESENT ILLNESS:  The patient is 77 year old female who has  been seen by Dr. Lequita Halt in the past.  She has previously undergone a  left hip replacement, but has known end-stage arthritis of the left  knee.  It has been progressive in nature.  It is felt she had reached a  point where she would benefit undergoing a knee replacement.  The risks  and benefits discussed.  The patient was subsequently admitted to the  hospital.   ALLERGIES:  NO KNOWN DRUG ALLERGIES.   CURRENT MEDICATIONS:  1. Cataflam.  2. Ziac.  3. VESIcare.  4. Micardis.  5. Centrum Silver vitamin.  6. Calcium.  7. Lescol.  8. Fish oil.  9. Vitamin D.   PAST MEDICAL HISTORY:  1. History of anemia.  2. Hypertension.  3. Hiatal hernia.  4. History of urinary tract infections.  5. History of urinary incontinence.   PAST SURGICAL HISTORY:  1. Right knee replacement.  2. Right hip replacement.  3. Spinal fusion.  4. Left hip replacement.  5. She has had one dislocation with closed reduction of the left hip.   SOCIAL HISTORY:  Divorced, is a Veterinary surgeon, nonsmoker, occasional intake of  alcohol.  Three children.   FAMILY HISTORY:  Mother with history of congestive heart failure.  Father with history of colon cancer.   REVIEW OF SYSTEMS:  GENERAL:  No fevers, chills, night sweats.  NEURO:  No seizures, syncope or paralysis.  RESPIRATORY:  No shortness breath, productive cough or hemoptysis.  CARDIOVASCULAR:  No chest pain or orthopnea.  GI:  No nausea,  vomiting,  diarrhea or constipation.  GU:  No dysuria, hematuria, a little bit of  frequency.  MUSCULOSKELETAL:  Left knee.   PHYSICAL EXAMINATION:  VITAL SIGNS:  Pulse 54, respirations 14, blood  pressure 134/74.  GENERAL:  A 73-year white female well-nourished, well-developed,  overweight, no acute distress, alert and cooperative, pleasant.  Good  historian.  HEENT:  Normocephalic, atraumatic.  Pupils are round and reactive.  EOMs  intact.  Full upper dentures.  NECK:  Supple.  CHEST:  Clear.  HEART:  Regular rate and rhythm.  No murmur.  S1 and S2.  ABDOMEN.  Soft, round, slightly protuberant abdomen.  Bowel sounds  present.  RECTAL/GENITALIA:  Not done as per history of present illness.  EXTREMITIES:  Left knee no effusion, marked crepitus.  Range of motion 5-  95.  Tender more medial than lateral.   IMPRESSION:  Osteoarthritis, left knee.   PLAN:  1. The patient was admitted to  [facility] to undergo a left      total knee replacement arthroplasty.  2. Surgery will be performed by Ollen Gross, M.D.  3. She has been seen preop by Caryn Bee L. Little, M.D. and felt to be      stable for upcoming surgery.      Alexzandrew L. Perkins, P.A.C.      Ollen Gross, M.D.  Electronically Signed    ALP/MEDQ  D:  10/25/2007  T:  10/25/2007  Job:  [id number]   cc:   Caryn Bee L. Little, M.D.  Fax: [phone]   Ollen Gross, M.D.  Fax: [phone]

## 2010-11-10 NOTE — Op Note (Signed)
NAME:  Moroney, Elanor                    ACCOUNT NO.:  [id number]   MEDICAL RECORD NO.:  [id number]          PATIENT TYPE:  AMB   LOCATION:  DSC                          FACILITY:  [facility]   PHYSICIAN:  Tennis Must Meyerdierks, M.D.DATE OF BIRTH:  06-13-1934   DATE OF PROCEDURE:  02/28/2008  DATE OF DISCHARGE:                               OPERATIVE REPORT   PREOPERATIVE DIAGNOSIS:  Right carpal tunnel syndrome.   POSTOPERATIVE DIAGNOSIS:  Right carpal tunnel syndrome.   PROCEDURE:  Decompression, median nerve, right carpal tunnel.   SURGEON:  Lowell Bouton, MD   ANESTHESIA:  Marcaine 0.5% and local with sedation.   OPERATIVE FINDINGS:  The patient had no masses in the carpal canal.  The  motor branch of the nerve was intact.   PROCEDURE:  Under 0.5% Marcaine local anesthesia with a tourniquet on  the right arm, the right hand was prepped and draped in usual fashion  and after exsanguinating the limb, the tourniquet was inflated to 250  mmHg.  A 3-cm longitudinal incision was made just ulnar to the thenar  crease and carried down through the subcutaneous tissues.  Blunt  dissection was carried through the superficial palmar fascia distal to  the transverse carpal ligament.  A hemostat was then placed in the  carpal canal up against the hook of hamate and the transverse carpal  ligament was divided on the ulnar border of median nerve.  The proximal  end of the ligament was divided with the scissors after dissecting the  nerve away from the undersurface of the ligament.  The carpal canal was  then palpated and was found to be adequately decompressed.  The nerve  was examined and the motor branch was identified.  The wound was then  irrigated with saline.  The skin was closed with 4-0 nylon sutures.  Sterile dressings were applied followed by a volar wrist splint.  The  patient tolerated the procedure well and went to the recovery room awake  in stable and good  condition.      Lowell Bouton, M.D.  Electronically Signed     EMM/MEDQ  D:  02/28/2008  T:  02/29/2008  Job:  [id number]

## 2010-11-13 NOTE — Op Note (Signed)
[facility]  Patient:    [name] [name], [name] [name] Visit Number: [id number] MRN: [id number]          Service Type: SUR Location: 4W [id number] Attending Physician:  Loanne Drilling Dictated by:   Ollen Gross, M.D. Proc. Date: 07/11/01 Admit Date:  07/11/2001                             Operative Report  PREOPERATIVE DIAGNOSIS:  Osteoarthritis, right hip.  POSTOPERATIVE DIAGNOSIS:  Osteoarthritis, right hip.  PROCEDURE:  Right total hip arthroplasty.  SURGEON:  Ollen Gross, M.D.  ASSISTANT:  Ottie Glazier. Wynona Neat, P.A.-C.  ANESTHESIA:  General.  ESTIMATED BLOOD LOSS:  300  DRAINS:  Hemovac x1.  COMPLICATIONS:  None.  CONDITION:  Stable to recovery.  BRIEF CLINICAL NOTE:  [name] is a 77 year old female with severe progressive degenerative arthritis of the right hip with pain refractory to nonoperative management. She presents now for right total hip arthroplasty.  DESCRIPTION OF PROCEDURE:  After successful administration of general anesthetic, the patient is placed in the left lateral decubitus position with the right side up and held with a hip positioner. The right lower extremity was isolated from the perineum with plastic drapes and prepped and draped in the usual sterile fashion. Standard posterolateral incision is made, skin cut a 10 blade through the subcutaneous tissue, to the level of the fascia lata which was incised in line with the skin incision. The sciatic nerve was palpated and protected and short external rotators isolated off the femur. The capsule is then excised and the hip dislocated. She had a severely collapsed femoral head with marked degenerative change. The center of the head was identified and the trial placed such that the center of the trial head corresponds to the center of her native femoral head. Osteotomy line was marked on the femoral neck and osteotomy made with an oscillating saw. The femur was retracted  anteriorly and then acetabular exposure obtained.  She had a lot of soft tissue in the acetabulum which was subsequently removed. Reaming starts at a 49 centrally. There was protrusio defect not through the medial wall. We reamed up to 51 centrally and then 55 on the periphery. I took the cancellous bone from the femoral head and placed the 43 mm reamer into the femoral head to get the cancellous bone and subsequently utilized that as central bone graft. The 56 mm pinnacle acetabular shell was then impacted in anatomic position and transfixed with two dome screws with great purchase. The trial 32 mm neutral liner was placed.  The proximal femur is repaired first with the canal finder and irrigation. Axial reaming was performed with 13.5 mm, proximal reaming up to 18D and the sleeve is machined to a small. An 18D small sleeve is placed and the 18 x 13 stem with a 36 plus 8 neck and 32 plus 0 head are placed matching her native anteversion. The hip is reduced with great stability, full extension, full external rotation, 70 degrees flexion, 40 degrees adduction, 90 degrees internal rotation and 90 degrees flexion, and 70 degrees internal rotation. The trials are removed. The wound copiously irrigated and then the apex hole eliminator placed into the acetabular shell and subsequently a 32 mm neutral marathon liner is impacted into the acetabulum. The femur is then addressed and the 18D small sleeve is placed, 18 x 13 stem with a 36 plus 8 neck placed matching native  anteversion. A 32 plus 0 head is placed, hip reduced with the same stability parameters. The wound was copiously irrigated with antibiotic solution and short external rotators reattached to the femur through drill holes. The fascia lata was closed over a Hemovac drain with interrupted #1 Vicryl, subcu closed in three layers with #1, #1, and 2-0 Vicryl. The subcuticular was closed with running 4-0 monocryl. The incision was clean  and dry and Steri-Strips and bulky sterile dressing applied. Drains hooked to suction. She was placed into a knee immobilizer, awakened and transported to recovery in stable condition. Dictated by:   Ollen Gross, M.D. Attending Physician:  Loanne Drilling DD:  07/11/01 TD:  07/12/01 Job: [id number]

## 2010-11-13 NOTE — Discharge Summary (Signed)
NAME:  [name] [name], [name] [name]                    ACCOUNT NO.:  [id number]   MEDICAL RECORD NO.:  [id number]          PATIENT TYPE:  INP   LOCATION:  1612                         FACILITY:  [facility]   PHYSICIAN:  Ollen Gross, M.D.    DATE OF BIRTH:  1933-11-26   DATE OF ADMISSION:  10/26/2006  DATE OF DISCHARGE:  10/30/2006                               DISCHARGE SUMMARY   ADMITTING DIAGNOSES:  1. Osteoarthritis left hip.  2. Hypertension.  3. Hiatal hernia.  4. History of urinary tract infections.  5. History of urinary incontinence.  6. History of anemia.   DISCHARGE DIAGNOSES:  1. Osteoarthritis left hip status post left total hip arthroplasty.  2. Postop blood loss anemia.  Did not require transfusion.  3. Postop hyponatremia, improving.  4. Hypertension.  5. Hiatal hernia.  6. History of urinary tract infections.  7. History of urinary incontinence.  8. History of anemia.   PROCEDURE:  October 26, 2006, left total hip.  Surgeon Dr. Lequita Halt,  assistant Avel Peace PA-C.  Consults none.   BRIEF HISTORY:  [name] [name] is a 77 year old female with severe end-stage  arthritis of left hip with severe erosion of the femoral head,  intractable pain, successful right total hip now presents for left total  hip.   LABORATORY DATA:  Preop CBC showed hemoglobin 13.3, hematocrit 39.7,  white cell count 7.6, postop hemoglobin 10.1 drift down to 8.3.  Last  H&H was 7.7 and 22.6.  PT/PTT on admission 13.6 and 30, respectively.  INR 1.0.  Serial pro times followed.  PT/INR 16.2, 1.3.  Chem panel on  admission slightly elevated potassium of 5.5, elevated BUN at 28.  Glucose 107.  Remaining Chem panel within normal limits.  Serial B-mets  were followed.  Potassium came down to normal level of 4.5.  Sodium did  drop from 143-132, back up to 134.  Preop UA small leukocyte esterase,  many epithelials, only 3-6 white cells, trace protein, small ketones.  Blood group type O+.  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  Two view  chest preop October 19, 2006, no acute findings.  Postop portable hip and  pelvis film was well seated components left total hip.   HOSPITAL COURSE:  The patient was admitted to [facility],  tolerated procedure well, later transferred to the orthopedic floor.  Started on PCA and p.o. analgesics pain control following surgery.  Given 24 hours postop IV antibiotics.  Also placed on Coumadin for DVT  prophylaxis.  Had decent night following surgery.  Main complaint was  dry mouth from the oxygen.  Otherwise, doing pretty well on the morning  of day one.  Had a little bit of low urinary output.  Had not had much  intake since midnight.  Given fluids and monitored the output.  PCA was  discontinued.  Her output did increase though, over the next several  hours encouraged p.o. intake.  Starting up out of bed by day #2, was  actually doing  pretty well.  Started getting up with therapy and  ambulating.  She did walk about 25 feet.  Dressing was changed.  Incision looked good.  Blood pressure was holding steady.  Her  hemoglobin was down a little bit down to 9.4.  We added iron, but she  was asymptomatic with this.  Sodium was down a little bit down to 132.  We discontinued the fluids and rechecked.  By day #3, she continued to  progress with physical therapies up about 35 feet and later about 75  feet.  Hemoglobin was down a little bit further.  She was asymptomatic  with this.  It was continued to be monitored.  She had no syncopal or  presyncopal episodes and continued to progress well.  Physical therapy  watched her for one more day to monitor the hemoglobin.  She was seen on  rounds on day #4.  Coverage hemoglobin was 7.7, asymptomatic.  She felt  good and wanted to go home.  She was sent home on iron.  She progressed  well was discharged home later that day.   DISCHARGE/PLAN:  1. Was  discharged home on Oct 30, 2006.  2. Discharge diagnoses please see above.   DISCHARGE MEDICATIONS:  1. Nu-Iron.  2. Percocet.  3. Robaxin.  4. Coumadin.  5. She was also given Cipro.   DISCHARGE INSTRUCTIONS:  1. Diet: Low-sodium, heart healthy diet.  2. Activity: Partial weightbearing 25 50%.  3. Hip precautions: Total hip protocol left lower extremity.  4. Home Heath PT, Home Health nursing.  5. Follow-up 2 weeks.   DISPOSITION:  Home.   CONDITION ON DISCHARGE:  Improving.      Alexzandrew L. Perkins, P.A.C.      Ollen Gross, M.D.  Electronically Signed    ALP/MEDQ  D:  01/04/2007  T:  01/04/2007  Job:  [id number]   cc:   Ollen Gross, M.D.  Fax: [phone]   Anna Genre. Little, M.D.  Fax: [phone]   Bertram Millard. Dahlstedt, M.D.  Fax: [phone]

## 2010-11-13 NOTE — Op Note (Signed)
NAME:  Salley, Tacie                    ACCOUNT NO.:  [id number]   MEDICAL RECORD NO.:  [id number]          PATIENT TYPE:  INP   LOCATION:  0003                         FACILITY:  [facility]   PHYSICIAN:  Ollen Gross, M.D.    DATE OF BIRTH:  Jul 27, 1933   DATE OF PROCEDURE:  10/26/2006  DATE OF DISCHARGE:                               OPERATIVE REPORT   PREOPERATIVE DIAGNOSIS:  Osteoarthritis, left hip.   POSTOPERATIVE DIAGNOSIS:  Osteoarthritis, left hip.   PROCEDURE:  Left total hip arthroplasty.   SURGEON:  Ollen Gross, MD   ASSISTANT:  Avel Peace, PA-C   ANESTHESIA:  General.   BLOOD LOSS:  600.   DRAINS:  None.   COMPLICATIONS:  Good condition and stable to recovery.   CLINICAL NOTE:  Ms. Bourquin is a 77 year old female who has severe end-stage  arthritic change of the left hip with severe erosion of the femoral  head.  She has had intractable pain.  She has had a successful right  total hip arthroplasty and presents now for a left total hip  arthroplasty.   PROCEDURE IN DETAIL:  After successful administration of general  anesthetic, the patient was placed in the right lateral decubitus  position with the left side up and held with the hip positioner.  The  left lower extremity was isolated from her perineum with plastic drapes  and prepped and draped in the usual sterile fashion.   A short posterolateral incision was made with a 10 blade through  subcutaneous tissue to the level of fascia lata, which was incised in  line with the skin incision.  The sciatic nerve was palpated and  protected and the short external rotator was isolated off the femur.  A  capsulectomy was performed and the hip was dislocated.  The center of  the femoral head was marked and a trial prosthesis placed such that the  center of the trial head corresponds to the center of native femoral  head.  Osteotomy lines marked on the femoral neck and osteotomy were  made with an oscillating saw.  The  femoral head was removed and then the  femur retracted anteriorly to gain acetabular exposure.   Acetabular retractors were placed.  Labrum and osteophytes were removed.  The reaming started at 45 mm, coursing increments of 2 to 55 mm, and  then a 56 mm pinnacle acetabular shell was placed in anatomic position  and transfixed with two dome screws.  A trial of 36 mm neutral +4 liner  was placed.   The femur was then prepared with canal finder and irrigation.  Axial  reaming was performed with a 13.5 mm proximal reaming to an 39F and the  sleeve machined to a small.  The 39F small trial sleeve was placed with  18 x 13 stem, 36 plus 8 neck, about 10 degrees beyond her native  anteversion to give it total anteversion of about 20.  The trial 36 plus  0 head was placed.  The hip was reduced with excellent stability.  She  had full extension and  full external rotation at 70 degrees flexion, 40  degrees adduction, 90 degrees internal rotation, 90 degrees of flexion  and 70 degrees of internal rotation.  By placing the left leg on top of  the right, I felt as though the leg lengths were equal.   The hip was then dislocated and all trials were removed.  The apex hole  eliminator was placed into the acetabular shell and then the 36 mm  neutral +4 marathon liner was placed.  On the femoral side, we placed  the 22F small sleeve with an 18 x 13 stem, 36 plus 8 neck,  again, 10  degrees beyond native anteversion.  The 36 plus 0 head was placed and  the hip was reduced with the same stability parameters.   The wound was copiously irrigated with saline solution and the short  rotators reattached to the femur through drill holes.  The fascia lata  was closed with no drain with interrupted #1 Vicryl.  The subcu was  closed with #1 in multiple layers and then 2-0 Vicryl and subcuticular  running 4-0 Monocryl.  The incision was cleaned and dried and Steri-  Strips and a bulky sterile dressing applied.   She was placed into a knee  immobilizer, awakened and transported to recovery in stable condition.      Ollen Gross, M.D.  Electronically Signed     FA/MEDQ  D:  10/26/2006  T:  10/26/2006  Job:  [id number]

## 2010-11-13 NOTE — H&P (Signed)
[facility]  Patient:    [name] [name], [name] [name] Visit Number: [id number] MRN: [id number]          Service Type: Attending:  Ollen Gross, M.D. Dictated by:   Sammuel Cooper. Mahar, P.A. Adm. Date:  07/11/01                           History and Physical  DATE OF BIRTH:  10-31-2033  CHIEF COMPLAINT:  Right hip pain.  HISTORY OF PRESENT ILLNESS:  The patient is a 77 year old female who has had long history of right hip and lower extremity pain.  She had a total knee replacement done in January of 2002, which did alleviate some of her pain, however, she has had continued ongoing pain in this right hip radiating down her right lower extremity.  It has been increasing in severity over the last month.  It has gotten to the point that it is almost constant in nature and she does have rest pain and night pain.  It has gotten to the point that it is limiting her activities of daily living and quality of life.  The risks and benefits of the proposed surgery were discussed with the patient by Ollen Gross, M.D., and she indicated understanding and wanted to proceed.  She denies any paresthesias or numbness to her extremity.  She denies any paralysis or weakness and the dysfunction is secondary only to pain.  ALLERGIES:  No known drug allergies.  MEDICATIONS: 1. Ziac 5 mg q.d. 2. Darvocet p.r.n. pain. 3. She also takes some vitamin supplements. 4. Zantac p.r.n.  PAST MEDICAL HISTORY:  Significant for hypertension and gastroesophageal reflux disease.  PAST SURGICAL HISTORY:  Significant for right total knee in January of 2002, hysteroscopy in October of 2002, and tonsillectomy at age 33.  SOCIAL HISTORY:  The patient denies any tobacco use.  She rarely has a drink of alcohol.  She is divorced.  She has three grown children.  One of her daughters will be around postoperatively to help her.  Her home does have four stairs to get in.  She would prefer home health physical  therapy if she is appropriate for that.  FAMILY MEDICAL HISTORY:  Her mother is deceased at age 24 secondary to CHF. Her father is deceased at age 35 secondary to colon cancer.  She also has a sister deceased at age 14 secondary to colon cancer.  REVIEW OF SYSTEMS:  The patient denies any fevers, chills, bleeding tendencies, or night sweats.  CNS:  Denies any blurred vision, double vision, headaches, seizures, or paralysis.  Cardiovascular:  Denies any chest pain, angina, palpitations, claudication, or orthopnea.  Pulmonary:  Denies any shortness of breath, productive cough, or hemoptysis.  GI:  Denies any nausea, vomiting, constipation, diarrhea, melena, or bloody stool.  GU:  Denies any dysuria, hematuria, or discharge.  Musculoskeletal:  As per HPI.  PHYSICAL EXAMINATION:  VITAL SIGNS:  The blood pressure is 146/96, respirations are 16 and unlabored, and the pulse is 60 and regular.  GENERAL APPEARANCE:  The patient is a 77 year old female who was alert and oriented and in no acute distress.  She is well nourished and well groomed. She appears her stated age.  She is very pleasant and cooperative to exam.  HEENT:  The head is normocephalic and atraumatic.  Extraocular movements are intact.  Nares patent bilaterally.  The pharynx is clear without any erythema or exudate.  NECK:  Soft to palpation.  No bruits appreciated.  No thyromegaly or lymphadenopathy is noted.  CHEST:  Clear to auscultation bilaterally.  No rales, rhonchi, stridor, wheezes, or friction rubs.  BREASTS:  Not pertinent and not performed.  HEART:  S1 and S2.  Regular rate and rhythm.  No murmurs, rubs, or gallops noted.  ABDOMEN:  Soft and supple to palpation.  Positive bowel sounds throughout. Nontender and nondistended.  No organomegaly noted.  GENITOURINARY:  Not pertinent and not performed.  EXTREMITIES:  Right hip flexion to 85 degrees, no internal rotation, external rotation 10 degrees, and  abduction of 20 degrees.  The right lower extremity is a 1/2 inch shorter compared to the left lower extremity.  Pulses are intact distally.  Sensation is grossly intact distally.  SKIN:  Intact without any lesions or rashes.  RADIOLOGY:  X-rays show severe erosive degenerative changes to the right hip.  IMPRESSION: 1. Right hip osteoarthritis, which is severe. 2. Hypertension. 3. Reflux.  PLAN:  Admit to [facility] on July 11, 2000, to undergo a right total hip arthroplasty by Ollen Gross, M.D.  The patients primary care physician is Anna Genre. Little, M.D., of [facility]. Dictated by:   Sammuel Cooper. Mahar, P.A. Attending:  Ollen Gross, M.D. DD:  07/06/01 TD:  07/06/01 Job: [id number]

## 2010-11-13 NOTE — H&P (Signed)
[place]  Patient:    [name] [name], [name] [name]                          MRN: [id number] Adm. Date:  07/12/00 Attending:  Claude Manges. Cleophas Dunker, M.D. Dictator:   Arnoldo Morale, P.A.-C.                         History and Physical  DATE OF BIRTH:  [birth year]  CHIEF COMPLAINT:  Right knee pain for the last five years.  HISTORY OF PRESENT ILLNESS:  This 77 year old white female presented initially to Dr. Lunette Stands, with a five-year history of intermittent right knee pain. She has a history of a right knee arthroscopy by Dr. Elana Alm. Wainer in November 1999, and did okay until she suffered a fall onto her right knee in March 2001.  Since that time the pain has gotten much worse.  At this time the pain is intermittent but presently almost constantly with weightbearing on her leg.  It is described as a dull pain most of the time, which becomes sharp with certain movements.  The pain is located diffusely about the joint, with no radiation, but she does complain of some muscle soreness in her thigh and calf at times.  The pain increases with any weightbearing and movement, and decreases with rest and Vioxx.  She does have a lot of stiffness in the knee, and difficulty with pain waking her up at night if she does not take two extra strength Tylenol before bedtime.  The knee does pop and swell, but does not catch, lock, or give way.  She has no paresthesias associated with the pain, and Vioxx provides moderate relief of her pain.  She is walking with a limp, but no assistive devices at this time.  ALLERGIES:  No known drug allergies.  CURRENT MEDICATIONS:  1. Ziac 5 mg one tablet p.o. q.d.  2. Vioxx 25 mg one tablet p.o. q.d.  3. Vitamin C 1000 mg one tablet p.o. q.d.  4. Vitamin E 400 IU, two tablets p.o. q.d.  5. Caltrate plus D 600 mg, two tablets p.o. q.d.  6. Chondroitin and glucosamine 500 mg p.o. q.d.  7. PremPro 2.5 mg one tablet p.o. q.d.  8. Iron  one tablet p.o. q.d.  9. Extra strength Tylenol 1000 mg p.o. q.h.s. 10. Zantac 75 mg one tablet p.o. q.d.  PAST MEDICAL HISTORY: 1. She has had hypertension for the last four years. 2. She reports needing Zantac every day, possibly due to the Vioxx,    so I believe she does have some problems with reflux.  She denies any history of diabetes mellitus, thyroid disease, hiatal hernia, peptic ulcer disease, heart disease, asthma, or any other chronic medical condition other than noted previously.  PAST SURGICAL HISTORY: 1. Tonsillectomy at age 71. 2. Right knee arthroscopy in November 1999, by Dr. Thurston Hole.  SOCIAL HISTORY:  She has an 18-pack-year-history of cigarette smoking which she quit in 1983.  She drinks about two alcoholic beverages a week.  She denies any drug use.  She is divorced and has three children.  She lives by herself in a one-story house with four steps into the main entrance.  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  Her medical doctor is Dr. Caryn Bee L. Little at [facility] in [facility], and his phone number is [phone].  FAMILY HISTORY:  Her mother died at age 86 with hypertension and a pacemaker and congestive heart failure.  Her father died at age 60 with colon cancer. She has two sisters who are living, one at age 47 with venous stasis disease, and the other age 24 with hypertension.  She had one sister who died at age 57 with colon cancer and scleroderma.  Her children:  Daughters age 77 and 45, and a son age 86.  They are all healthy.  REVIEW OF SYSTEMS:  She does have occasional sinus congestion and infections,, approximately once or twice a year.  She reports she does have some mild sinus congestion at this time.  She does get dyspneic with ambulating about one flight of stairs.  Her last period was about 10 years ago.  She reports she may have been diagnosed with either an irregular heart beat or a heart murmur, but she is  asymptomatic at this time.   She does have some stress urinary incontinence and bilateral shoulder osteoarthritis.  She wears dentures on her upper jaw line, and glasses.  She has a living will, and her power of attorney is Mr. Talana Slatten at [phone].  PHYSICAL EXAMINATION:  GENERAL:  A well-developed, well-nourished overweight white female, in no acute distress.  She walks with a significant limp on the right.  Mood and affect are appropriate.  She talks easily with the examiner.   Height 5 feet 9 inches, weight 240 pounds, BMI 34.5.  VITAL SIGNS:  Temperature 98 degrees Fahrenheit, pulse 80, respirations 12, blood pressure 162/86.  HEENT:  Normocephalic, atraumatic, without frontal or maxillary sinus tenderness to palpation.  Conjunctivae pink.  Sclerae anicteric.  PERRLA. EOMs intact.  Funduscopic examination shows a visible red reflux bilaterally with normal retinal vasculature, optic disc, and cup.  No visible external ear deformities.  Hearing is grossly intact.  Tympanic membranes pearly gray bilaterally with good light reflex.  Nose and nasal septum midline.  Nasal mucosa pink and moist, without exudates or polyps noted.  Buccal mucosa is pink and moist.  Good dentition.  Pharynx without erythema or exudates. Tongue and uvula midline.  Tongue without fasciculations.  Uvula rises equally  with phonation.  NECK:  No visible masses or lesions noted.  Trachea midline.  No palpable lymphadenopathy or thyromegaly.  Carotids +2 bilaterally without bruits.  A full range of motion of the cervical spine.  Nontender to palpation along the cervical spine.  CARDIOVASCULAR:  Heart rate and rhythm regular.  S1, S2 present, without rubs, clicks, or murmurs noted.  LUNGS:  Respirations even and unlabored.  Breath sounds clear to auscultation bilaterally without rales or wheezes noted.  ABDOMEN:  Rounded abdominal contour.  Bowel sounds present x 4 quadrants. Soft, nontender to  palpation, without hepatosplenomegaly or CVA tenderness. Femoral pulses +1 bilaterally.  No tenderness to palpation along the vertebral column.   BREASTS/GU/RECTAL/PELVIC:  These examinations deferred at this time.  MUSCULOSKELETAL:  No obvious deformities of her bilateral upper extremities, with a full range of motion of these extremities without pain.  Her radial pulses are +2 bilaterally.  She has a full range of motion of her left hip, bilateral ankles and toes.  DP and PT pulses are +2.  Mild edema in her lower extremities at the ankles.  She has a full range of motion of her left knee without pain.  Full extension, 120 degrees flexion.  No effusion in the knee. No crepitus with range of motion.  Nontender  to palpation along the joint line.  Collateral ligaments are stable.  Her right hip has a decreased range of motion compared to the left.  It has full flexion and extension, but she does have pain with internal and external rotation, and these are decreased, and her pelvis rocks compared to the left hip range of motion.  She also complains of pain with palpation in the right groin.  Her right knee has no obvious deformity.  Small effusion noted.  Acutely tender to palpation over the lateral joint line of the right knee.  A large amount of crepitus centered mostly around the lateral aspect of the patella with range of motion.  She has some valgus deformity of the knee.  She has full extension, but can only flex the right knee to about 90 degrees.  NEUROLOGIC:  Alert and oriented x 3.  Cranial nerves II-XII are grossly intact.  Strength 5/5 bilateral upper and lower extremities.  Rapid alternating movements intact.  Sensation intact to light touch.  Deep tendon reflexes are 2+ bilateral upper and lower extremities.  RADIOLOGIC FINDINGS:  X-rays taken of her right knee in August 2001, show end-stage osteoarthritis in the lateral and patellofemoral compartment of the right knee.   She has significant osteophytes and spurring in both these areas.  IMPRESSION: 1. End-stage osteoarthritis, right knee. 2. Hypertension. 3. Obesity. 4. Mild reflux.  PLAN:  Ms. Monahan will be admitted to [facility] on July 12, 2000, where she will undergo a right total knee arthroplasty by Dr. Claude Manges. [place].  She will undergo all the routine preoperative laboratory tests and studies prior to this procedure.  Dr. Coral Else office will be notified of her upcoming admission, and the patient will do that herself.  If she has any medical problems while she is hospitalized, we will call his office or [place] hospitalist to follow her. DD:  07/06/00 TD:  07/06/00 Job: [id number]

## 2010-11-13 NOTE — H&P (Signed)
NAME:  [name] [name], [name] [name]                    ACCOUNT NO.:  [id number]   MEDICAL RECORD NO.:  [id number]          PATIENT TYPE:  INP   LOCATION:  NA                           FACILITY[facility]  [facility]   PHYSICIAN:  Ollen Gross, M.D.    DATE OF BIRTH:  [birth year]   DATE OF ADMISSION:  DATE OF DISCHARGE:                              HISTORY & PHYSICAL   CHIEF COMPLAINT:  Left hip and knee pain.   HISTORY OF PRESENT ILLNESS:  The patient is a 78 year old female who has  been seen by Dr. Lequita Halt for ongoing left hip and knee pain.  She has  known end-stage arthritis in the left hip, and the left leg is getting  worse.  She is seen in the office and found to have end-stage arthritis  with bone-on-bone throughout the left hip with erosive arthritis and  significant erosion of femoral head as well as the superior acetabulum.  She continues to have pain despite conservative measures. It is felt she  has reached a point where she can benefit undergoing hip replacement.  Risks and benefits have been discussed and she elects to proceed with  surgery.   ALLERGIES:  No known drug allergies.   CURRENT MEDICATIONS:  1. Cataflam.  2. Micardis.  3. Lescol.  4. Aspirin.  5. Centrum Silver.  6. Vitamin.  7. Vicodin.  8. Calcium plus D.  9. Primrose oil.   PAST MEDICAL HISTORY:  Hypertension, hiatal hernia, urinary tract  infections, urinary incontinence, history of anemia.   PAST SURGICAL HISTORY:  Right knee replacement January 2002, right hip  replacement January 2003, spinal fusion September 2004 which was  multilevel fusion from L5-T12.   SOCIAL HISTORY:  Divorced, retired Veterinary surgeon, nonsmoker, seldom intake of  alcohol, 3 children.  Children will be assisting with care after  surgery.   FAMILY HISTORY:  Significant for colon cancer.   REVIEW OF SYSTEMS:  GENERAL:  No fevers, chills, night sweats.  NEURO:  No seizures, syncope or paralysis.  RESPIRATORY:  No shortness of  breath, productive cough,  or hemoptysis.  CARDIOVASCULAR:  __________  chest pain or orthopnea.  GI:  No nausea, vomiting, diarrhea, or  constipation.  GU: A little bit of urinary frequency.  No dysuria or  hematuria.  MUSCULOSKELETAL:  Left leg.   PHYSICAL EXAMINATION:  VITAL SIGNS:  Pulse 56, respirations 12, blood  pressure 122/60.  GENERAL:  A 77 year old, white female well-nourished, well-developed,  slightly overweight in no acute distress.  She is accompanied by her  daughter.  HEENT:  Normocephalic atraumatic.  Pupils round and reactive.  She does  wear reading glasses.  EOMs intact.  Upper full denture plates.  CHEST:  Clear anterior posterior chest walls.  HEART:  Regular rate and rhythm except with a bradycardic rhythm at 56.  ABDOMEN:  Soft and slightly protuberant.  Bowel sounds present.  RECTAL, BREAST, GENITALIA:  Not done, not pertinent to present illness.  EXTREMITIES:  Left hip shows flexion 90, internal rotation 10, external  rotation 30, abduction of 30.  Left knee shows no effusion,  marked  crepitus.  Range of motion of 5-115.  No instability.   IMPRESSION:  1. Osteoarthritis left hip.  2. Hypertension.  3. Hiatal hernia.  4. History of urinary tract infections.  5. Urinary incontinence.  6. History of anemia.   PLAN:  The patient admitted to [facility] to undergo a left  total hip replacement arthroplasty.  Surgery will be performed by Dr.  Ollen Gross.  Her medical physician is Dr. Catha Gosselin with [facility].  [facility] will be consulted if needed for  medical assistance with the patient throughout the hospital course.      Alexzandrew L. Perkins, P.A.C.      Ollen Gross, M.D.  Electronically Signed    ALP/MEDQ  D:  10/25/2006  T:  10/26/2006  Job:  [id number]   cc:   Ollen Gross, M.D.  Fax: [phone]   Anna Genre. Little, M.D.  Fax: [phone]   Bertram Millard. Dahlstedt, M.D.  Fax: [phone]

## 2010-11-13 NOTE — Discharge Summary (Signed)
[place]  Patient:    [name] [name], [name] [name]                           MRN: [id number] Adm. Date:  [id number] Disc. Date: [id number] Attending:  Randolm Idol Dictator:   Arnoldo Morale, P.A.C. CC:         Anna Genre. Little, M.D., [facility]   Discharge Summary  ADMISSION DIAGNOSES: 1. End-stage osteoarthritis right knee. 2. Hypertension. 3. Obesity. 4. Gastroesophageal reflux disease.   DISCHARGE DIAGNOSES: 1. End-stage osteoarthritis right knee. 2. Hypertension. 3. Obesity. 4. Gastroesophageal reflux disease. 5. Post hemorrhagic anemia. 6. Postoperative hyponatremia. 7. Hypocalcemia.  SURGICAL PROCEDURE:  On July 12, 2000, [name] [name] underwent a right total knee arthroplasty by Claude Manges. Cleophas Dunker, M.D., assisted by Arnoldo Morale, P.A.C.  COMPLICATIONS:  None.  CONSULTS: 1. Pharmacy consult for Coumadin therapy July 12, 2000. 2. Physical therapy and rehab medicine consult July 13, 2000. 3. Case management consult on July 04, 2000. 4. Occupational therapy consult July 15, 2000.  HISTORY OF PRESENT ILLNESS:  This 77 year old white female presented to Lunette Stands, M.D., initially with a five year history of intermittent right knee pain.  She has a history of knee arthroscopy in the past and did well until she fell on her right knee in March of 2001. Since that time the pain has increased and is now described as an intermittent pain that is constant with any weightbearing on her legs.  It is very dull but becomes sharp with certain movements and is located diffusely about the joint without radiation.  The pain increases with any weightbearing and movement and decreases with rest and with Vioxx.  She has significant stiffness and popping and swelling of her knee.  Because of this, she is presenting for a right total knee arthroplasty.  HOSPITAL COURSE:  [name] [name] tolerated her surgical procedure well without immediate  postoperative complications.  She was subsequently transferred to 5000.  Postoperative day #1, T-max was 101.1.  Vital signs were stable.  Leg was neurovascularly intact.  Hemoglobin was 9.9, hematocrit 30.1. Sodium was 130 and calcium was 7.6.  She was continued on her Caltrate and her IV fluids were changed to D5 normal saline. She was started on PT per protocol.  Postoperative day #2, T-max had been 102.1. Vital signs were stable. Incentive spirometry was encouraged.  Her right knee incision was well-approximated with staples and leg was neurovascularly intact.  Her hemoglobin was 8.8, hematocrit 26.7.  Her sodium had improved to 137.  She weakness started on iron twice a day and also transfused with two units of autologous blood.  She was switched to p.o. pain medications and continued on therapy per protocol.  On postoperative day #3, T-max was 99. Vital signs were stable.  Leg was otherwise neurovascularly intact.  Her hemoglobin had improved to 9.7 with hematocrit of 29. She was continued on therapy per protocol.  She continued to make good progress over the next day or so and was ready for discharge home on July 16, 2000.  DISCHARGE INSTRUCTIONS: 1. She is to resume all prehospitalization medications and diet. 2. Additional medications include:    a. Coumadin 7.5 mg a day and 5 mg p.o. q.d. with dosages to be adjusted       by pharmacy.    b. Oxy-IR 5 mg one to two tablets p.o. q.4h. p.r.n. for pain.    c. OxyContin  10 mg one tablet p.o. q.12h. 3. She is to be out of bed as tolerated, partial weightbearing on the right    leg with the use of a walker. 4. She has arranged for home health physical therapy and a nurse for her    protimes and therapy. Also she will be monitored in the outpatient    Coumadin monitoring protocol. 5. She is to keep her right knee incision clean and dry and to notify    Dr. Cleophas Dunker of temperature greater than 101.5, chills, pain unrelieved     by pain medications, or foul-smelling drainage from the wound. 6. She has arranged to have a home CPM machine 0 to 90 degrees as tolerated    six to eight hours a day. 7. She is to follow up with Dr. Cleophas Dunker in our office in approximately    seven to 10 days and is to call [phone] to set up that appointment.  LABORATORY DATA:  July 06, 2000, hemoglobin 11.5, hematocrit 35. July 13, 2000, hemoglobin was 9.9, hematocrit 30.1.  On July 14, 2000, hemoglobin was 8.8, hematocrit 26.7.  On July 15, 2000, hemoglobin was 9.7, hematocrit 29.  On July 06, 2000, PT was 12.8, INR 1 and PTT 26.  On July 16, 2000, PT was 78 with an INR of 1.6.  On July 06, 2000, albumin was 3.4.  On July 13, 2000, sodium was 130, pot 3.5, glucose 153 and calcium 7.6.  On July 14, 2000, sodium was 137, potassium 3.4, chloride 103, CO2 27, glucose 160, BUN 9, creatinine 0.8 and calcium 8.2.  All other laboratory studies were within normal limits. DD:  07/27/00 TD:  07/27/00 Job: [id number]

## 2010-11-13 NOTE — Discharge Summary (Signed)
[facility]  Patient:    [name] [name], BAND Visit Number: [id number] MRN: [id number]          Service Type: SUR Location: 4W [id number] [id number] Attending Physician:  Loanne Drilling Dictated by:   Dorie Rank, P.A. Admit Date:  07/11/2001 Discharge Date: 07/17/2001                             Discharge Summary  ADMISSION DIAGNOSES: 1. Osteoarthritis to the right hip. 2. Hypertension. 3. Reflux.  DISCHARGE DIAGNOSES: 1. Osteoarthritis of the right hip. 2. Postoperative hemorrhagic anemia, stable, status post transfusion. 3. Hypertension. 4. Reflux.  PROCEDURE:  On July 11, 2001, the patient underwent a right total hip arthroplasty, surgeon Dr. Ollen Gross, first assistant Karie Chimera, P.A.-C.  Anesthesia was general.  No complications.  CONSULTATION:  Physical medicine and rehabilitation at [facility], Dr. Thomasena Edis.  HISTORY OF PRESENT ILLNESS:  This is a pleasant, 77 year old, female who has had a long history of right hip pain and lower extremity pain.  She had a total knee replacement in January 2002, which did alleviate some of her pain; however, she has had continued ongoing pain into this right hip radiating down her right lower extremity.  The pain had been increasing in severity over the last several months.  It was progressive to the point where it was almost constant in nature and she had rest and night pain.  It was interfering with her activities of daily living and quality of life.  Diagnostic studies revealed severe osteoarthritis to her hip and it was felt that she would benefit from undergoing a right total hip arthroplasty.  Risks and benefits as well as the procedure were discussed with the patient.  She agreed to proceed.  HOSPITAL COURSE:  The patient had the above-stated surgery without complications on July 11, 2001.  The incision was dressed in a sterile fashion and was clean, dry and intact on postop day #1.  Dressing was  changed on postop day #2 and daily thereafter.  The wound was found to be free of any erythema or drainage.  While in the operating room, a Foley catheter was placed.  This was discontinued on postop day #2 without difficulty.  She had no dysuria following this throughout the remainder of her hospital stay. Neurovascular and vitals were checked routinely and were found to be intact and stable.  Appropriate IV antibiotics were used postoperatively for infection control.  Initially, the patient utilized a PCA, however, this was discontinued on postop day #2 and she utilized p.o. pain medications throughout the remainder of her hospital stay with adequate pain control.  She was placed on total joint precautions with physical therapy and occupational therapy.  She was somewhat slow to progress initially.  Rehabilitation consult was obtained, however, she did have remarkable improvement in her ambulation status and it was felt she would be stable for discharge home.  A regular diet was advanced.  She had good p.o. intake and did have bowel movements prior to her discharge.  Her home medications of Ziac and Zantac were resumed postoperatively.  She was placed on Coumadin for DVT prophylaxis and this was monitored and dosed by the pharmacy throughout her hospital stay.  BMET was checked postoperatively for two days and found to be stable.  Hemoglobin and hematocrit were checked daily while in the hospital.  She did have a downward trend of her hemoglobin.  She had  no orthostatic symptoms.  On July 16, 2001, her hemoglobin did progressively drift down to 8.1.  It was felt that she would benefit from transfusion.  She was transfused two units of packed RBCs without difficulty.  She tolerated this well.  Her hemoglobin did improve to 10.9 on date of discharge.  She was touchdown weightbearing to the right lower extremity.  On July 17, 2001, it was felt that she would be medically and  orthopedically stable for discharge.  LABORATORY DATA AND X-RAY FINDINGS:  Preoperative H&H on July 04, 2001, was 13.4 and 39.3 respectively.  On July 12, 2001, hemoglobin 11.0, hematocrit 32.2.  On July 15, 2001, hemoglobin 8.5, hematocrit 25.0.  On July 16, 2001, hemoglobin 8.1 and hematocrit 23.3.  On July 17, 2001, hemoglobin 10.9 and hematocrit 32.1.  On July 13, 2001, PT was 16.2, INR 1.4.  PT on July 17, 2001, at the highest level of 22.0, INR 2.2.  BMET on July 04, 2001, within normal limits.  On July 12, 2001, BMET revealed a glucose of 132, otherwise normal.  On July 13, 2001, some mild hyponatremia was seen at 132, glucose 149.  On July 14, 2001, sodium 134, glucose 169.  UA on July 04, 2001, showed small leukocyte esterase with many epithelial cells, many bacteria and hyaline casts.  On July 15, 2001, UA was negative other than trace leukocyte esterase, few epithelials and few bacteria.  Blood type from July 11, 2001, O positive.  Cardiology tracings from April 14, 2001, revealed normal sinus rhythm which was confirmed by Dr. [place] Bing.  On July 04, 2001, right hip and pelvis views revealed severe degenerative changes in the right hip with some cystic changes in structure of femoral head.  On July 11, 2001, one view right hip and pelvis x-rays revealed bipolar right total hip replacement felt to be in satisfactory position and alignment.  CONDITION ON DISCHARGE:  Improved.  DISPOSITION:  Discharged to home with home health PT and OT.  Coumadin therapy to be given with [place].  DISCHARGE MEDICATIONS: 1. Percocet. 2. Robaxin. 3. Coumadin.  WOUND CARE:  Daily dressing changes.  SPECIAL INSTRUCTIONS:  Shower as long as the incision is not draining. She should call should she have any questions or concerns prior to her appointment with Dr. Lequita Halt.  FOLLOWUP:  Follow up with Dr. Lequita Halt on January 28 or January  30, call for an appointment.   ACTIVITY:  Continue touchdown weightbearing status to the right lower extremity.  DIET:  Regular diet. Dictated by:   Dorie Rank, P.A. Attending Physician:  Loanne Drilling DD:  08/11/01 TD:  08/12/01 Job: [id number]

## 2010-11-13 NOTE — Op Note (Signed)
[facility]  Patient:    [name] [name], [name] [name] Visit Number: [id number] MRN: [id number]          Service Type: DSU Location: [facility] Attending Physician:  Jenean Lindau Dictated by:   Laqueta Linden, M.D. Proc. Date: 04/18/01 Admit Date:  04/18/2001                             Operative Report  PREOPERATIVE DIAGNOSES:       Postmenopausal bleeding due to endometrial polyp.  POSTOPERATIVE DIAGNOSES:      Postmenopausal bleeding due to endometrial polyp.  PROCEDURE:                    Hysteroscopic resection with curettage.  SURGEON:                      Laqueta Linden, M.D.  ANESTHESIA:                   General LMA.  ESTIMATED BLOOD LOSS:         Less than 25 cc.  FLUIDS:                       Sorbitol intake less than 50 cc.  COMPLICATIONS:                None.  INDICATIONS:                  [name] [name] is a 77 year old menopausal female on hormonal replacement therapy who presented with a new onset of postmenopausal bleeding.  She underwent pelvic ultrasound with sonohysterogram revealing a 6 x 4 mm fundal posterior polyp.  The remainder of the endometrial cavity was free of lesions.  Alternatives including wait and see with office biopsy versus a hysteroscopic resection were discussed with the patient who elected to proceed to definitive surgical management.  She has seen the informed consent film, has voiced her understanding of risks, benefits, alternatives, and complications, and agrees to proceed.  PROCEDURE:                    The patient was taken to the operating room and after proper identification and consents were ascertained she was placed on the operating room table in the supine position.  She was placed in the [place] stirrups prior to induction of anesthesia due to her back, hip, and leg problems.  Once it was established she was in a comfortable position, she was then placed under general LMA anesthesia.  The perineum and vagina  were prepped and draped in a routine sterile fashion.  A transurethral Foley was placed which was removed at the conclusion of the procedure.  Bimanual examination confirmed an anterior small mobile uterus.  The speculum was placed in the vagina and the cervix grasped with a single tooth tenaculum. The uterine cavity sounded to 6.5 cm.  The internal os was gently dilated to a #33 Pratt dilator.  The resectoscope with continuous video and Sorbitol infusion was then inserted under direct vision.  The endocervical canal was free of lesions.  There was a broad based polyp on the posterior fundal surface of the endometrium consistent with that found at sonohysterogram. There was a smaller polyp noted in the mid posterior wall of the uterus as well.  Both tubal ostia were visualized.  The remainder of the endometrial cavity  appeared atrophic.  Photographs of preoperative findings were taken. The resectoscope was placed on routine settings and the two noted polyps were resected and sent to pathology.  Small bleeding points were cauterized.  The resectoscope was then removed.  Sharp curettage productive of a minimal amount of additional tissue was then performed with specimens sent separately.  The tenaculum was removed.  Silver nitrate and pressure was required for hemostasis at the tenaculum site.  There was no active bleeding from the cervix.  Estimated blood loss was less than 25 cc.  Sorbitol intake less than 50 cc.  Specimens were sent to pathology.  Patient was stable and awake on transfer to the recovery room.  She will be observed and discharged per anesthesia protocol.  She will follow-up in the office in four to six weeks time or sooner for excessive pain, fever, bleeding, or other concerns.  She was given routine discharge instructions, both written and verbal.  Told to continue taking her Vioxx and extra strength Tylenol as needed for any cramping and to call prior to scheduled  followup for excessive pain, fever, bleeding, or other concerns. Dictated by:   Laqueta Linden, M.D. Attending Physician:  Jenean Lindau DD:  04/18/01 TD:  04/19/01 Job: [id number]

## 2010-11-13 NOTE — Op Note (Signed)
[facility]  Patient:    [name] [name], [name] [name]                           MRN: [id number] Proc. Date: 07/12/00 Adm. Date:  [id number] Attending:  Randolm Idol                           Operative Report  PREOPERATIVE DIAGNOSIS:  Osteoarthritis of right knee.  POSTOPERATIVE DIAGNOSIS:  Osteoarthritis of right knee.  OPERATION:  Right total knee replacement.  SURGEON:  Claude Manges. Cleophas Dunker, M.D.  ASSISTANT:  Arnoldo Morale, P.A.  ANESTHESIA:  General orotracheal  COMPLICATIONS:  None.  COMPONENTS:  DePuy LCS standard femoral, standard plus rotating tibial platform with a 15 mm polyethylene bridging bearing and a cruciate designed metal backed rotating patella.  All was secured with polymethyl methacrylate.  DESCRIPTION OF PROCEDURE:  With the patient comfortable on the operating table and under general orotracheal anesthesia, the right lower extremity was placed in a thigh tourniquet.  The leg was then prepped with Betadine scrub and Duraprep.  The thigh tourniquet to the mid foot.  Sterile draping was performed.  With the extremity still elevated, it was Esmarch exsanguinated with a proximal tourniquet to 350 mmHg.  A midline longitudinal incision was made centered over the patella and via sharp dissection the subcutaneous tissue from the superior pouch of the tibial tubercle.  The first layer of capsule was incised in the midline.  There was abundant adipose tissue which made the procedure somewhat technically difficult.  A medial parapatellar incision was made through the deep capsule. There is approximately 15 to 20 cc of clear yellow joint effusion.  The patella was everted 180 degrees.  The knee flexed to 90 degrees.  There was a valgus position of the knee with weight bearing but I could correct it to neutral.  There were very large osteophytes along the lateral femoral condyle.  To a lesser extent the medial femoral condyle.  There were  also large osteophytes along the lateral tibial plateau and these were all removed so we could best determine the appropriate femoral and tibial size.  We had templated a standard to standard plus femoral component.  The standard component was determined intraoperatively.  The appropriate jigs were then applied to make the tibial and femoral cuts.  Both ACL and PCL were sacrificed.  MCL and LCL remained intact.  A 4 degree distal femoral cut was made.  It appeared though that flexion and extension gaps were equal at 12.5.  A laminar spreader was inserted in both medial and lateral joint to remove remnants of ACL and PCL, as well as to remove the medial and lateral menisci. The popliteus was incised laterally as it was quite tight.  There were several small loose bodies removed from the posterior lateral joint.  Osteophytes were also removed from the posterior aspect of the femoral condyle both medially and laterally.  A retractor was then placed upon the tibia. It was advanced anteriorly.  We determined a standard plus rotating tibial platform.  The appropriate center hole was then made.   All the trial components were inserted including the standard femoral and standard plus rotating tibial platform and a 12.5 mm bridging bearing.  I thought that there was a little opening medially.  We trialed a 15 mm bridging bearing and thought that we had still slight hyperextension and  better stability medially to laterally and they appeared to be symmetrical both medially and laterally.  According, we used the 15 mm bridging bearing.  The patella was then prepared.  Using the patella guide bone was removed so that we could apply the cruciate jig.  The bone was incised with the bur.  The trial patella was then applied and fit very nicely.  It was tight with flexion with subluxation and dislocation so we performed a lateral release.  At that point it was stable.  All the trial components were  removed.  The joint was then copiously irrigated with jet saline lavage antibiotic solution.  The knee was then flexed 90 degrees.  Retractors were inserted.  The tibial component was secured with polymethyl methacrylate.  Extraneous methacrylate was removed.  The 15 mm bridging bearing was applied, followed by the cemented femoral component. Again extraneous methacrylate was removed giving excellent fit of all components.  The knee was then placed in full extension to provide compression during maturation of the methacrylate.  The patella was applied with methacrylate and patellar clamp.  Again extraneous methacrylate was removed.  After maturation of the methacrylate the joint was inspected. There was a small amount of extraneous methacrylate in the intercondylar notch.  This was removed with an osteotome.  The tourniquet was deflated.  There was immediate capillary refill and bleeding circumferentially about the joint.  Bleeders were Bovie coagulated. There was very little bleeding thereafter and felt that a Hemovac was not necessary.  The wound was again irrigated with saline solution.  The deep capsule was closed with interrupted #1 Ethibon.  Superficial capsule closed with running 0 Vicryl.  Subcuticular with 2-0 Vicryl.  The skin was closed with skin clips.  A sterile bulky dressing was applied, followed by an Ace bandage and a knee immobilizer.  Dr. Michelle Piper was to perform a femoral nerve block postoperatively.  The patient tolerated the procedure without complications. DD:  07/12/00 TD:  07/12/00 Job: [id number]

## 2010-11-13 NOTE — Discharge Summary (Signed)
NAME:  [name] [name], [name] [name]                    ACCOUNT NO.:  [id number]   MEDICAL RECORD NO.:  [id number]          PATIENT TYPE:  INP   LOCATION:  1531                         FACILITY:  [facility]   PHYSICIAN:  Ollen Gross, M.D.    DATE OF BIRTH:  May 10, 1934   DATE OF ADMISSION:  10/25/2007  DATE OF DISCHARGE:  10/29/2007                               DISCHARGE SUMMARY   ADMITTING DIAGNOSES:  1. Osteoarthritis, left knee.  2. History of anemia.  3. Hypertension.  4. Hiatal hernia.  5. History of urinary tract infections.  6. History of urinary incontinence.   DISCHARGE DIAGNOSES:  1. Osteoarthritis, left knee status post left total knee replacement      arthroplasty.  2. Mild postop blood loss anemia.  Remaining discharge diagnoses      admitting  3. Hypertension.  4. Hiatal hernia.  5. History of urinary tract infections.  6. History of urinary incontinence.   PROCEDURE:  October 25, 2007:  Left total knee.  Surgeon Dr. Lequita Halt.  Assistant Avel Peace, PA-C.   CONSULTS:  None.   BRIEF HISTORY:  [name] [name] is a 77 year old female with severe end-stage  arthritis of the left knee and progressive worsening pain dysfunction,  failed nonoperative management.  Now presents for total knee  arthroplasty.   LABORATORY DATA:  Preop CBC showed a hemoglobin of 13.6, hematocrit of  40.6, white cell count 5.8, platelets 263,000.  Postop hemoglobin of  10.9, came up to 11.2.  Last noted H&H back down to 9.2 and 27.5.  PT/PTT preop 13.3 and 30 respectively.  INR 1.  Serial protimes  followed.  Last noted PT/INR 24.3 and 2.1.  Chem panel on admission all  within normal limits.  Serial B mets were followed.  Electrolytes  remained within normal limits.  Preop UA:  Small leukocyte esterase,  many epithelials, hyaline casts, rare yeast, otherwise negative.  Blood  group type O positive.  Two-view chest October 19, 2007:  Stable exam.  No  acute chest process.  EKG September 12, 2007:  Normal sinus rhythm, rate  60.  Preliminary requested by Dr. Clarene Duke.   HOSPITAL COURSE:  The patient admitted to [facility].  Tolerated procedure well.  Later transferred to the recovery room and  orthopedic floor.  Started on PCA and p.o. analgesic pain control  following surgery and given 24 hours postop IV antibiotics.  Was unable  to get much rest on the evening of surgery.  Doing a little bit better  on the morning of day one.  Started getting up with therapy.  Hemoglobin  was 10.  Started back on her home medications.  By day two, doing a  little bit better.  Got up and ambulated short distances with therapy.  Dressing changed.  Incision looked good.  Hemoglobin was stable.  Continued to progress well, was walking about 100 feet by postop day #3.  Had a little bit of erythema surrounding the leg so this was monitored.  She was afebrile.  Had run a little low-grade temperature on the evening  of  postop day #2 but back down afebrile by postop day #3.  Checked a  white count and white count was normal, 7.7, and on postop day #4, white  count was normal at 6.  Progressing well with therapy, tolerating meds,  and felt to be ready to go home on Oct 29, 2007.   DISCHARGE/PLAN:  1. The patient was discharged home on Oct 29, 2007.  2. Discharge diagnoses, please see above.   DISCHARGE MEDS:  1. Percocet.  2. Robaxin.  3. Coumadin.  4. She was also given Keflex for a short period time due to the mild      erythema.   DIET:  Low-sodium, heart-healthy diet.   ACTIVITY:  Weightbearing as tolerated, left leg, total knee protocol.  Home health PT, home nursing.   FOLLOW-UP:  2 weeks.   DISPOSITION:  Home.   CONDITION ON DISCHARGE:  Improving.      Alexzandrew L. Perkins, P.A.C.      Ollen Gross, M.D.  Electronically Signed    ALP/MEDQ  D:  12/13/2007  T:  12/13/2007  Job:  [id number]   cc:   Ollen Gross, M.D.  Fax: [phone]   Anna Genre. Little, M.D.  Fax: [phone]

## 2011-01-18 ENCOUNTER — Other Ambulatory Visit: Payer: Self-pay | Admitting: Orthopedic Surgery

## 2011-01-18 ENCOUNTER — Other Ambulatory Visit (HOSPITAL_COMMUNITY): Payer: Medicare Other

## 2011-01-18 ENCOUNTER — Encounter (HOSPITAL_COMMUNITY): Payer: Medicare Other

## 2011-01-18 LAB — URINE MICROSCOPIC-ADD ON

## 2011-01-18 LAB — URINALYSIS, ROUTINE W REFLEX MICROSCOPIC
Glucose, UA: NEGATIVE mg/dL
pH: 6 (ref 5.0–8.0)

## 2011-01-18 LAB — CBC
Hemoglobin: 14 g/dL (ref 12.0–15.0)
MCHC: 32.6 g/dL (ref 30.0–36.0)
RDW: 13.4 % (ref 11.5–15.5)
WBC: 8.1 10*3/uL (ref 4.0–10.5)

## 2011-01-18 LAB — COMPREHENSIVE METABOLIC PANEL
AST: 23 U/L (ref 0–37)
Albumin: 3.9 g/dL (ref 3.5–5.2)
BUN: 25 mg/dL — ABNORMAL HIGH (ref 6–23)
Creatinine, Ser: 1.22 mg/dL — ABNORMAL HIGH (ref 0.50–1.10)
Potassium: 4.5 mEq/L (ref 3.5–5.1)
Total Protein: 7.6 g/dL (ref 6.0–8.3)

## 2011-01-18 LAB — APTT: aPTT: 31 seconds (ref 24–37)

## 2011-01-18 LAB — PROTIME-INR
INR: 1.05 (ref 0.00–1.49)
Prothrombin Time: 13.9 seconds (ref 11.6–15.2)

## 2011-01-27 ENCOUNTER — Ambulatory Visit (HOSPITAL_COMMUNITY)
Admission: RE | Admit: 2011-01-27 | Discharge: 2011-01-27 | Disposition: A | Discharge status: Home / Self Care | Payer: Medicare Other | Source: Ambulatory Visit | Attending: Orthopedic Surgery | Admitting: Orthopedic Surgery

## 2011-01-27 DIAGNOSIS — Y831 Surgical operation with implant of artificial internal device as the cause of abnormal reaction of the patient, or of later complication, without mention of misadventure at the time of the procedure: Secondary | ICD-10-CM | POA: Insufficient documentation

## 2011-01-27 DIAGNOSIS — Z01812 Encounter for preprocedural laboratory examination: Secondary | ICD-10-CM | POA: Insufficient documentation

## 2011-01-27 DIAGNOSIS — I10 Essential (primary) hypertension: Secondary | ICD-10-CM | POA: Insufficient documentation

## 2011-01-27 DIAGNOSIS — T84498A Other mechanical complication of other internal orthopedic devices, implants and grafts, initial encounter: Secondary | ICD-10-CM | POA: Insufficient documentation

## 2011-02-08 NOTE — Op Note (Signed)
NAME:  [name] [name], [name] [name]                    ACCOUNT NO.:  [id number]  MEDICAL RECORD NO.:  [id number]  LOCATION:  DAYL                         FACILITY:  [facility]  PHYSICIAN:  Ollen Gross, M.D.    DATE OF BIRTH:  [birth year]  DATE OF PROCEDURE:  01/27/2011 DATE OF DISCHARGE:  01/27/2011                              OPERATIVE REPORT   PREOPERATIVE DIAGNOSIS:  Broken, painful hardware, left knee.  POSTOPERATIVE DIAGNOSIS:  Broken, painful hardware, left knee.  PROCEDURE:  Left knee hardware removal.  SURGEON:  Ollen Gross, MD  ASSISTANT:  Alexzandrew L. Julien Girt, PAC  ANESTHESIA:  General.  ESTIMATED BLOOD LOSS:  Minimal.  DRAIN:  None.  COMPLICATIONS:  None.  TOURNIQUET TIME:  21 minutes at 300 mmHg.  CONDITION:  Stable to Recovery.  BRIEF CLINICAL NOTE:  [name] [name] is a 77 year old female who had a periprosthetic left patella fracture last year.  She had open reduction and internal fixation.  She has had persistent problems with the knee giving out.  Radiographs showed that the hardware had broken.  In addition, patella had migrated some proximally.  The fracture itself was healed.  She presents now for hardware removal, possible repair of the patella tendon if it is torn.  PROCEDURE IN DETAIL:  After successful administration of general anesthetic, a tourniquet was placed high on the left thigh, left lower extremity prepped and draped in usual sterile fashion.  Extremity was wrapped in an Esmarch, tourniquet inflated to 300 mmHg.  Midline incision was made with a 10 blade through subcutaneous tissue to the level of the extensor mechanism.  Subcutaneous flaps were created.  I was able to identify the knot from the inferior wire, then I was able to remove the broken inferior wire.  I was also able to remove the broken piece of K-wire.  I then dissected up superiorly just above the patella, found a knot for the other broken wire and removed that.  I also removed the other 2  K-wires.  Patella was sitting about an inch high.  The patella tendon felt a little thin and somewhat attenuated.  I incised through that area and then had very good bulky tissue to repair and advance the inferior part of the tendon superiorly to pull the patella down.  We then took #2 FiberWire suture, 2 of them, and placed them in a Bunnell-type fashion going from the patella tendon at the tubercle and then up into the area that was incised.  We then passed the sutures through the proximal tissue to essentially advance the patella about an inch back to its normal anatomic position.  We then oversewed the tissue essentially doubling the thickness of the repair.  After this, I flexed against gravity and at 90 degrees there was absolutely no tension on that suture line.  The patella was resting in its normal position.  The tourniquet was then released total time of 21 minutes.  Wound was copiously irrigated with saline solution.  Subcutaneous was then closed with interrupted 2-0 Vicryl and subcuticular running 4-0 Monocryl. Incision was cleaned and dried and bulky sterile dressing applied.  She was placed into a knee immobilizer, awakened,  and transported to Recovery in stable condition.     Ollen Gross, M.D.     FA/MEDQ  D:  01/27/2011  T:  01/28/2011  Job:  [id number]  Electronically Signed by Ollen Gross M.D. on 02/08/2011 11:38:29 AM

## 2011-03-23 LAB — TYPE AND SCREEN
ABO/RH(D): O POS
Antibody Screen: NEGATIVE

## 2011-03-23 LAB — PROTIME-INR: Prothrombin Time: 14.4

## 2011-03-23 LAB — CBC
HCT: 31.7 — ABNORMAL LOW
Hemoglobin: 13.6
Platelets: 235
Platelets: 263
RDW: 14.6
WBC: 5.8
WBC: 6.9

## 2011-03-23 LAB — URINALYSIS, ROUTINE W REFLEX MICROSCOPIC
Glucose, UA: NEGATIVE
Hgb urine dipstick: NEGATIVE
Specific Gravity, Urine: 1.019
pH: 5.5

## 2011-03-23 LAB — COMPREHENSIVE METABOLIC PANEL
ALT: 19
Albumin: 3.7
Alkaline Phosphatase: 77
Chloride: 105
Potassium: 4.8
Sodium: 140
Total Bilirubin: 1.1
Total Protein: 6.7

## 2011-03-23 LAB — BASIC METABOLIC PANEL
BUN: 15
GFR calc Af Amer: >60
GFR calc non Af Amer: >60
Potassium: 3.8

## 2011-03-23 LAB — URINE MICROSCOPIC-ADD ON

## 2011-03-24 LAB — BASIC METABOLIC PANEL
CO2: 27
Calcium: 8.3 — ABNORMAL LOW
GFR calc Af Amer: >60
GFR calc non Af Amer: >60
Sodium: 135

## 2011-03-24 LAB — CBC
HCT: 27.5 — ABNORMAL LOW
HCT: 28.5 — ABNORMAL LOW
Hemoglobin: 11.2 — ABNORMAL LOW
Hemoglobin: 9.7 — ABNORMAL LOW
MCV: 90.5
RBC: 3.04 — ABNORMAL LOW
RBC: 3.16 — ABNORMAL LOW
RBC: 3.51 — ABNORMAL LOW
WBC: 6
WBC: 7.7

## 2011-03-24 LAB — PROTIME-INR: INR: 1.6 — ABNORMAL HIGH

## 2011-03-25 LAB — DIFFERENTIAL
Basophils Absolute: 0
Eosinophils Relative: 2
Lymphocytes Relative: 23
Monocytes Absolute: 0.4
Monocytes Relative: 7

## 2011-03-25 LAB — BASIC METABOLIC PANEL
CO2: 29
GFR calc non Af Amer: >60
Glucose, Bld: 139 — ABNORMAL HIGH
Potassium: 4
Sodium: 141

## 2011-03-25 LAB — CBC
HCT: 39.5
Hemoglobin: 13
MCHC: 33.1
RBC: 4.37
RDW: 14.4

## 2011-04-15 LAB — CBC
HCT: 34.5 — ABNORMAL LOW
Hemoglobin: 11.3 — ABNORMAL LOW
MCV: 88
Platelets: 301
RBC: 3.92
WBC: 8.5

## 2011-04-15 LAB — COMPREHENSIVE METABOLIC PANEL
Alkaline Phosphatase: 88
BUN: 21
CO2: 27
Chloride: 105
Creatinine, Ser: 0.94
GFR calc non Af Amer: 59 — ABNORMAL LOW
Glucose, Bld: 124 — ABNORMAL HIGH
Total Bilirubin: 0.9

## 2011-04-15 LAB — DIFFERENTIAL
Basophils Absolute: 0
Basophils Relative: 0
Lymphocytes Relative: 12
Neutro Abs: 6.8
Neutrophils Relative %: 81 — ABNORMAL HIGH

## 2011-07-08 DIAGNOSIS — Z79899 Other long term (current) drug therapy: Secondary | ICD-10-CM | POA: Diagnosis not present

## 2011-07-08 DIAGNOSIS — R7989 Other specified abnormal findings of blood chemistry: Secondary | ICD-10-CM | POA: Diagnosis not present

## 2011-08-06 DIAGNOSIS — R32 Unspecified urinary incontinence: Secondary | ICD-10-CM | POA: Diagnosis not present

## 2011-08-24 DIAGNOSIS — M1710 Unilateral primary osteoarthritis, unspecified knee: Secondary | ICD-10-CM | POA: Diagnosis not present

## 2011-10-27 DIAGNOSIS — Z79899 Other long term (current) drug therapy: Secondary | ICD-10-CM | POA: Diagnosis not present

## 2011-10-27 DIAGNOSIS — E119 Type 2 diabetes mellitus without complications: Secondary | ICD-10-CM | POA: Diagnosis not present

## 2011-10-27 DIAGNOSIS — R32 Unspecified urinary incontinence: Secondary | ICD-10-CM | POA: Diagnosis not present

## 2011-10-27 DIAGNOSIS — E7800 Pure hypercholesterolemia, unspecified: Secondary | ICD-10-CM | POA: Diagnosis not present

## 2011-10-27 DIAGNOSIS — R3915 Urgency of urination: Secondary | ICD-10-CM | POA: Diagnosis not present

## 2011-10-27 DIAGNOSIS — R7989 Other specified abnormal findings of blood chemistry: Secondary | ICD-10-CM | POA: Diagnosis not present

## 2011-10-27 DIAGNOSIS — I10 Essential (primary) hypertension: Secondary | ICD-10-CM | POA: Diagnosis not present

## 2011-12-27 DIAGNOSIS — Z79899 Other long term (current) drug therapy: Secondary | ICD-10-CM | POA: Diagnosis not present

## 2011-12-27 DIAGNOSIS — R32 Unspecified urinary incontinence: Secondary | ICD-10-CM | POA: Diagnosis not present

## 2011-12-27 DIAGNOSIS — R229 Localized swelling, mass and lump, unspecified: Secondary | ICD-10-CM | POA: Diagnosis not present

## 2011-12-28 ENCOUNTER — Other Ambulatory Visit: Payer: Self-pay | Admitting: Family Medicine

## 2011-12-28 DIAGNOSIS — R229 Localized swelling, mass and lump, unspecified: Secondary | ICD-10-CM

## 2012-01-02 ENCOUNTER — Ambulatory Visit
Admission: RE | Admit: 2012-01-02 | Discharge: 2012-01-02 | Disposition: A | Discharge status: Home / Self Care | Payer: Medicare Other | Source: Ambulatory Visit | Attending: Family Medicine | Admitting: Family Medicine

## 2012-01-02 DIAGNOSIS — M6740 Ganglion, unspecified site: Secondary | ICD-10-CM | POA: Diagnosis not present

## 2012-01-02 DIAGNOSIS — R229 Localized swelling, mass and lump, unspecified: Secondary | ICD-10-CM

## 2012-01-02 DIAGNOSIS — M25519 Pain in unspecified shoulder: Secondary | ICD-10-CM | POA: Diagnosis not present

## 2012-01-02 DIAGNOSIS — S46819A Strain of other muscles, fascia and tendons at shoulder and upper arm level, unspecified arm, initial encounter: Secondary | ICD-10-CM | POA: Diagnosis not present

## 2012-01-02 MED ORDER — GADOBENATE DIMEGLUMINE 529 MG/ML IV SOLN
10.0000 mL | Freq: Once | INTRAVENOUS | Status: AC | PRN
  Administered 2012-01-02: 10 mL via INTRAVENOUS

## 2012-01-04 DIAGNOSIS — Z85828 Personal history of other malignant neoplasm of skin: Secondary | ICD-10-CM | POA: Diagnosis not present

## 2012-01-04 DIAGNOSIS — L570 Actinic keratosis: Secondary | ICD-10-CM | POA: Diagnosis not present

## 2012-01-04 DIAGNOSIS — I781 Nevus, non-neoplastic: Secondary | ICD-10-CM | POA: Diagnosis not present

## 2012-01-04 DIAGNOSIS — L821 Other seborrheic keratosis: Secondary | ICD-10-CM | POA: Diagnosis not present

## 2012-01-04 DIAGNOSIS — D239 Other benign neoplasm of skin, unspecified: Secondary | ICD-10-CM | POA: Diagnosis not present

## 2012-02-09 DIAGNOSIS — N3000 Acute cystitis without hematuria: Secondary | ICD-10-CM | POA: Diagnosis not present

## 2012-02-22 DIAGNOSIS — M1710 Unilateral primary osteoarthritis, unspecified knee: Secondary | ICD-10-CM | POA: Diagnosis not present

## 2012-02-29 ENCOUNTER — Encounter: Payer: Self-pay | Admitting: Internal Medicine

## 2012-02-29 DIAGNOSIS — H43819 Vitreous degeneration, unspecified eye: Secondary | ICD-10-CM | POA: Diagnosis not present

## 2012-02-29 DIAGNOSIS — H35379 Puckering of macula, unspecified eye: Secondary | ICD-10-CM | POA: Diagnosis not present

## 2012-02-29 DIAGNOSIS — H43399 Other vitreous opacities, unspecified eye: Secondary | ICD-10-CM | POA: Diagnosis not present

## 2012-02-29 DIAGNOSIS — H2510 Age-related nuclear cataract, unspecified eye: Secondary | ICD-10-CM | POA: Diagnosis not present

## 2012-03-23 ENCOUNTER — Encounter: Payer: Self-pay | Admitting: Internal Medicine

## 2012-04-04 DIAGNOSIS — H269 Unspecified cataract: Secondary | ICD-10-CM | POA: Diagnosis not present

## 2012-04-04 DIAGNOSIS — H2510 Age-related nuclear cataract, unspecified eye: Secondary | ICD-10-CM | POA: Diagnosis not present

## 2012-04-04 DIAGNOSIS — Z23 Encounter for immunization: Secondary | ICD-10-CM | POA: Diagnosis not present

## 2012-04-04 DIAGNOSIS — I4891 Unspecified atrial fibrillation: Secondary | ICD-10-CM | POA: Diagnosis not present

## 2012-04-07 DIAGNOSIS — Z79899 Other long term (current) drug therapy: Secondary | ICD-10-CM | POA: Diagnosis not present

## 2012-04-07 DIAGNOSIS — I4891 Unspecified atrial fibrillation: Secondary | ICD-10-CM | POA: Diagnosis not present

## 2012-04-10 DIAGNOSIS — I4891 Unspecified atrial fibrillation: Secondary | ICD-10-CM | POA: Diagnosis not present

## 2012-04-10 DIAGNOSIS — Z7901 Long term (current) use of anticoagulants: Secondary | ICD-10-CM | POA: Diagnosis not present

## 2012-05-02 ENCOUNTER — Encounter (HOSPITAL_COMMUNITY): Payer: Self-pay | Admitting: Physical Medicine and Rehabilitation

## 2012-05-02 ENCOUNTER — Observation Stay (HOSPITAL_COMMUNITY)
Admission: EM | Admit: 2012-05-02 | Discharge: 2012-05-05 | Disposition: A | Discharge status: Home / Self Care | Payer: Medicare Other | Attending: Internal Medicine | Admitting: Internal Medicine

## 2012-05-02 DIAGNOSIS — N390 Urinary tract infection, site not specified: Secondary | ICD-10-CM | POA: Diagnosis present

## 2012-05-02 DIAGNOSIS — R300 Dysuria: Secondary | ICD-10-CM | POA: Diagnosis not present

## 2012-05-02 DIAGNOSIS — T45515A Adverse effect of anticoagulants, initial encounter: Secondary | ICD-10-CM | POA: Insufficient documentation

## 2012-05-02 DIAGNOSIS — R600 Localized edema: Secondary | ICD-10-CM | POA: Diagnosis present

## 2012-05-02 DIAGNOSIS — R109 Unspecified abdominal pain: Secondary | ICD-10-CM | POA: Insufficient documentation

## 2012-05-02 DIAGNOSIS — R791 Abnormal coagulation profile: Secondary | ICD-10-CM | POA: Diagnosis not present

## 2012-05-02 DIAGNOSIS — I10 Essential (primary) hypertension: Secondary | ICD-10-CM | POA: Diagnosis present

## 2012-05-02 DIAGNOSIS — R609 Edema, unspecified: Secondary | ICD-10-CM | POA: Diagnosis not present

## 2012-05-02 DIAGNOSIS — I4891 Unspecified atrial fibrillation: Secondary | ICD-10-CM | POA: Diagnosis not present

## 2012-05-02 DIAGNOSIS — R319 Hematuria, unspecified: Secondary | ICD-10-CM | POA: Diagnosis not present

## 2012-05-02 DIAGNOSIS — Y92009 Unspecified place in unspecified non-institutional (private) residence as the place of occurrence of the external cause: Secondary | ICD-10-CM | POA: Insufficient documentation

## 2012-05-02 DIAGNOSIS — R195 Other fecal abnormalities: Secondary | ICD-10-CM | POA: Insufficient documentation

## 2012-05-02 DIAGNOSIS — E785 Hyperlipidemia, unspecified: Secondary | ICD-10-CM | POA: Diagnosis not present

## 2012-05-02 DIAGNOSIS — Z7901 Long term (current) use of anticoagulants: Secondary | ICD-10-CM | POA: Insufficient documentation

## 2012-05-02 DIAGNOSIS — T45511A Poisoning by anticoagulants, accidental (unintentional), initial encounter: Secondary | ICD-10-CM | POA: Diagnosis present

## 2012-05-02 DIAGNOSIS — R310 Gross hematuria: Principal | ICD-10-CM | POA: Insufficient documentation

## 2012-05-02 HISTORY — DX: Essential (primary) hypertension: I10

## 2012-05-02 LAB — PROTIME-INR
INR: 6.12 (ref 0.00–1.49)
Prothrombin Time: 49.6 seconds — ABNORMAL HIGH (ref 11.6–15.2)

## 2012-05-02 LAB — COMPREHENSIVE METABOLIC PANEL
ALT: 17 U/L (ref 0–35)
Albumin: 3.7 g/dL (ref 3.5–5.2)
Alkaline Phosphatase: 78 U/L (ref 39–117)
Chloride: 105 mEq/L (ref 96–112)
Potassium: 3.9 mEq/L (ref 3.5–5.1)
Sodium: 143 mEq/L (ref 135–145)
Total Bilirubin: 0.7 mg/dL (ref 0.3–1.2)
Total Protein: 7 g/dL (ref 6.0–8.3)

## 2012-05-02 LAB — URINALYSIS, ROUTINE W REFLEX MICROSCOPIC
Glucose, UA: 100 mg/dL — AB
Nitrite: POSITIVE — AB
Specific Gravity, Urine: 1.02 (ref 1.005–1.030)
pH: 5 (ref 5.0–8.0)

## 2012-05-02 LAB — CBC WITH DIFFERENTIAL/PLATELET
Basophils Absolute: 0 10*3/uL (ref 0.0–0.1)
Basophils Relative: 0 % (ref 0–1)
Eosinophils Absolute: 0.2 10*3/uL (ref 0.0–0.7)
Hemoglobin: 13.9 g/dL (ref 12.0–15.0)
MCHC: 33.7 g/dL (ref 30.0–36.0)
Monocytes Relative: 11 % (ref 3–12)
Neutro Abs: 4.8 10*3/uL (ref 1.7–7.7)
Neutrophils Relative %: 64 % (ref 43–77)
Platelets: 286 10*3/uL (ref 150–400)
RBC: 4.48 MIL/uL (ref 3.87–5.11)

## 2012-05-02 LAB — URINE MICROSCOPIC-ADD ON

## 2012-05-02 LAB — APTT: aPTT: 128 seconds — ABNORMAL HIGH (ref 24–37)

## 2012-05-02 MED ORDER — BISOPROLOL-HYDROCHLOROTHIAZIDE 10-6.25 MG PO TABS: 1.0000 | ORAL_TABLET | ORAL | Status: DC

## 2012-05-02 MED ORDER — ONDANSETRON HCL 4 MG PO TABS: 4.0000 mg | ORAL_TABLET | Freq: Four times a day (QID) | ORAL | Status: DC | PRN

## 2012-05-02 MED ORDER — FAMOTIDINE 20 MG PO TABS
20.0000 mg | ORAL_TABLET | Freq: Every day | ORAL | Status: DC
  Administered 2012-05-02 – 2012-05-04 (×3): 20 mg via ORAL
  Filled 2012-05-02 (×4): qty 1

## 2012-05-02 MED ORDER — DEXTROSE 5 % IV SOLN
1.0000 g | INTRAVENOUS | Status: DC
  Administered 2012-05-02 – 2012-05-05 (×3): 1 g via INTRAVENOUS
  Filled 2012-05-02 (×4): qty 10

## 2012-05-02 MED ORDER — OXYBUTYNIN CHLORIDE 5 MG PO TABS
5.0000 mg | ORAL_TABLET | Freq: Two times a day (BID) | ORAL | Status: DC
  Administered 2012-05-03 – 2012-05-05 (×5): 5 mg via ORAL
  Filled 2012-05-02 (×6): qty 1

## 2012-05-02 MED ORDER — BISOPROLOL-HYDROCHLOROTHIAZIDE 5-6.25 MG PO TABS
1.0000 | ORAL_TABLET | Freq: Every evening | ORAL | Status: DC
  Administered 2012-05-03 – 2012-05-04 (×2): 1 via ORAL
  Filled 2012-05-02 (×3): qty 1

## 2012-05-02 MED ORDER — LOSARTAN POTASSIUM 50 MG PO TABS: 100.0000 mg | ORAL_TABLET | Freq: Every day | ORAL | Status: DC

## 2012-05-02 MED ORDER — DILTIAZEM HCL ER COATED BEADS 120 MG PO TB24
120.0000 mg | ORAL_TABLET | Freq: Every day | ORAL | Status: DC
  Administered 2012-05-03 – 2012-05-05 (×3): 120 mg via ORAL
  Filled 2012-05-02 (×3): qty 1

## 2012-05-02 MED ORDER — FUROSEMIDE 20 MG PO TABS
20.0000 mg | ORAL_TABLET | Freq: Every day | ORAL | Status: DC
  Administered 2012-05-03 – 2012-05-05 (×3): 20 mg via ORAL
  Filled 2012-05-02 (×3): qty 1

## 2012-05-02 MED ORDER — LOSARTAN POTASSIUM 50 MG PO TABS
100.0000 mg | ORAL_TABLET | Freq: Every day | ORAL | Status: DC
  Administered 2012-05-03 – 2012-05-05 (×3): 100 mg via ORAL
  Filled 2012-05-02 (×3): qty 2

## 2012-05-02 MED ORDER — ACETAMINOPHEN 650 MG RE SUPP: 650.0000 mg | Freq: Four times a day (QID) | RECTAL | Status: DC | PRN

## 2012-05-02 MED ORDER — HYDROCODONE-ACETAMINOPHEN 5-325 MG PO TABS: 1.0000 | ORAL_TABLET | ORAL | Status: DC | PRN

## 2012-05-02 MED ORDER — SIMVASTATIN 40 MG PO TABS: 40.0000 mg | ORAL_TABLET | Freq: Every day | ORAL | Status: DC

## 2012-05-02 MED ORDER — ROSUVASTATIN CALCIUM 10 MG PO TABS
10.0000 mg | ORAL_TABLET | Freq: Every day | ORAL | Status: DC
  Administered 2012-05-04: 10 mg via ORAL
  Filled 2012-05-02 (×3): qty 1

## 2012-05-02 MED ORDER — ACETAMINOPHEN 325 MG PO TABS
650.0000 mg | ORAL_TABLET | Freq: Four times a day (QID) | ORAL | Status: DC | PRN
  Administered 2012-05-04: 650 mg via ORAL
  Filled 2012-05-02: qty 2

## 2012-05-02 MED ORDER — ONDANSETRON HCL 4 MG/2ML IJ SOLN: 4.0000 mg | Freq: Four times a day (QID) | INTRAMUSCULAR | Status: DC | PRN

## 2012-05-02 MED ORDER — BISOPROLOL-HYDROCHLOROTHIAZIDE 10-6.25 MG PO TABS
1.0000 | ORAL_TABLET | Freq: Every day | ORAL | Status: DC
  Administered 2012-05-03 – 2012-05-05 (×3): 1 via ORAL
  Filled 2012-05-02 (×3): qty 1

## 2012-05-02 NOTE — ED Provider Notes (Signed)
History     CSN: [id number]  Arrival date & time 05/02/12  1604   First MD Initiated Contact with Patient 05/02/12 1956      Chief Complaint  Patient presents with  . Hematuria  . Abdominal Pain    (Consider location/radiation/quality/duration/timing/severity/associated sxs/prior treatment) HPI Patient noticed gross hematuria yesterday. Seen at her PMDs office today noted to have elevated INR sent here for further evaluation. She denies abdominal pain denies chest pain feels mild generalized weakness. No other complaint.. no treatment prior to coming here Past Medical History  Diagnosis Date  . Atrial fibrillation   . Hypertension     No past surgical history on file. Bilateral knee replacements No family history on file.  History  Substance Use Topics  . Smoking status: Never Smoker   . Smokeless tobacco: Not on file  . Alcohol Use: Yes     Comment: social    OB History    Grav Para Term Preterm Abortions TAB SAB Ect Mult Living                  Review of Systems  Genitourinary:       Hematuria  Neurological: Positive for weakness.    Allergies  Review of patient's allergies indicates no known allergies.  Home Medications  No current outpatient prescriptions on file.  BP 107/62  Pulse 85  Temp 98.7 F (37.1 C) (Oral)  Resp 18  SpO2 97%  Physical Exam  Nursing note and vitals reviewed. Constitutional: She is oriented to person, place, and time. She appears well-developed and well-nourished. No distress.  HENT:  Head: Normocephalic and atraumatic.  Eyes: Conjunctivae normal are normal. Pupils are equal, round, and reactive to light.  Neck: Neck supple. No tracheal deviation present. No thyromegaly present.  Cardiovascular: Normal rate.   No murmur heard.      Irregularly irregular  Pulmonary/Chest: Effort normal and breath sounds normal.  Abdominal: Soft. Bowel sounds are normal. She exhibits no distension. There is no tenderness.       Obese    Genitourinary:       gross broad on bimanual exam Rectal exam Brown stool Hemoccult positive(trace)  Musculoskeletal: Normal range of motion. She exhibits edema. She exhibits no tenderness.       Bilateral pretibial pitting edema  Neurological: She is alert and oriented to person, place, and time. Coordination normal.  Skin: Skin is warm and dry. No rash noted.  Psychiatric: She has a normal mood and affect.   Results for orders placed during the hospital encounter of 05/02/12  CBC WITH DIFFERENTIAL      Component Value Range   WBC 7.5  4.0 - 10.5 K/uL   RBC 4.48  3.87 - 5.11 MIL/uL   Hemoglobin 13.9  12.0 - 15.0 g/dL   HCT 08.6  57.8 - 46.9 %   MCV 92.0  78.0 - 100.0 fL   MCH 31.0  26.0 - 34.0 pg   MCHC 33.7  30.0 - 36.0 g/dL   RDW 62.9  52.8 - 41.3 %   Platelets 286  150 - 400 K/uL   Neutrophils Relative 64  43 - 77 %   Neutro Abs 4.8  1.7 - 7.7 K/uL   Lymphocytes Relative 22  12 - 46 %   Lymphs Abs 1.6  0.7 - 4.0 K/uL   Monocytes Relative 11  3 - 12 %   Monocytes Absolute 0.8  0.1 - 1.0 K/uL   Eosinophils Relative 3  0 - 5 %   Eosinophils Absolute 0.2  0.0 - 0.7 K/uL   Basophils Relative 0  0 - 1 %   Basophils Absolute 0.0  0.0 - 0.1 K/uL  APTT      Component Value Range   aPTT 128 (*) 24 - 37 seconds  PROTIME-INR      Component Value Range   Prothrombin Time 49.6 (*) 11.6 - 15.2 seconds   INR 6.12 (*) 0.00 - 1.49  COMPREHENSIVE METABOLIC PANEL      Component Value Range   Sodium 143  135 - 145 mEq/L   Potassium 3.9  3.5 - 5.1 mEq/L   Chloride 105  96 - 112 mEq/L   CO2 26  19 - 32 mEq/L   Glucose, Bld 97  70 - 99 mg/dL   BUN 24 (*) 6 - 23 mg/dL   Creatinine, Ser 4.09 (*) 0.50 - 1.10 mg/dL   Calcium 9.4  8.4 - 81.1 mg/dL   Total Protein 7.0  6.0 - 8.3 g/dL   Albumin 3.7  3.5 - 5.2 g/dL   AST 23  0 - 37 U/L   ALT 17  0 - 35 U/L   Alkaline Phosphatase 78  39 - 117 U/L   Total Bilirubin 0.7  0.3 - 1.2 mg/dL   GFR calc non Af Amer 42 (*) >90 mL/min   GFR calc Af  Amer 49 (*) >90 mL/min  OCCULT BLOOD, POC DEVICE      Component Value Range   Fecal Occult Bld POSITIVE     No results found.  ED Course  Procedures (including critical care time)  Labs Reviewed  APTT - Abnormal; Notable for the following:    aPTT 128 (*)     All other components within normal limits  PROTIME-INR - Abnormal; Notable for the following:    Prothrombin Time 49.6 (*)     INR 6.12 (*)     All other components within normal limits  COMPREHENSIVE METABOLIC PANEL - Abnormal; Notable for the following:    BUN 24 (*)     Creatinine, Ser 1.22 (*)     GFR calc non Af Amer 42 (*)     GFR calc Af Amer 49 (*)     All other components within normal limits  CBC WITH DIFFERENTIAL   No results found.   No diagnosis found.  Results for orders placed during the hospital encounter of 05/02/12  CBC WITH DIFFERENTIAL      Component Value Range   WBC 7.5  4.0 - 10.5 K/uL   RBC 4.48  3.87 - 5.11 MIL/uL   Hemoglobin 13.9  12.0 - 15.0 g/dL   HCT 91.4  78.2 - 95.6 %   MCV 92.0  78.0 - 100.0 fL   MCH 31.0  26.0 - 34.0 pg   MCHC 33.7  30.0 - 36.0 g/dL   RDW 21.3  08.6 - 57.8 %   Platelets 286  150 - 400 K/uL   Neutrophils Relative 64  43 - 77 %   Neutro Abs 4.8  1.7 - 7.7 K/uL   Lymphocytes Relative 22  12 - 46 %   Lymphs Abs 1.6  0.7 - 4.0 K/uL   Monocytes Relative 11  3 - 12 %   Monocytes Absolute 0.8  0.1 - 1.0 K/uL   Eosinophils Relative 3  0 - 5 %   Eosinophils Absolute 0.2  0.0 - 0.7 K/uL   Basophils Relative 0  0 -  1 %   Basophils Absolute 0.0  0.0 - 0.1 K/uL  APTT      Component Value Range   aPTT 128 (*) 24 - 37 seconds  PROTIME-INR      Component Value Range   Prothrombin Time 49.6 (*) 11.6 - 15.2 seconds   INR 6.12 (*) 0.00 - 1.49  COMPREHENSIVE METABOLIC PANEL      Component Value Range   Sodium 143  135 - 145 mEq/L   Potassium 3.9  3.5 - 5.1 mEq/L   Chloride 105  96 - 112 mEq/L   CO2 26  19 - 32 mEq/L   Glucose, Bld 97  70 - 99 mg/dL   BUN 24 (*) 6 -  23 mg/dL   Creatinine, Ser 1.61 (*) 0.50 - 1.10 mg/dL   Calcium 9.4  8.4 - 09.6 mg/dL   Total Protein 7.0  6.0 - 8.3 g/dL   Albumin 3.7  3.5 - 5.2 g/dL   AST 23  0 - 37 U/L   ALT 17  0 - 35 U/L   Alkaline Phosphatase 78  39 - 117 U/L   Total Bilirubin 0.7  0.3 - 1.2 mg/dL   GFR calc non Af Amer 42 (*) >90 mL/min   GFR calc Af Amer 49 (*) >90 mL/min  OCCULT BLOOD, POC DEVICE      Component Value Range   Fecal Occult Bld POSITIVE    URINALYSIS, ROUTINE W REFLEX MICROSCOPIC      Component Value Range   Color, Urine RED (*) YELLOW   APPearance TURBID (*) CLEAR   Specific Gravity, Urine 1.020  1.005 - 1.030   pH 5.0  5.0 - 8.0   Glucose, UA 100 (*) NEGATIVE mg/dL   Hgb urine dipstick LARGE (*) NEGATIVE   Bilirubin Urine LARGE (*) NEGATIVE   Ketones, ur >80 (*) NEGATIVE mg/dL   Protein, ur >045 (*) NEGATIVE mg/dL   Urobilinogen, UA 1.0  0.0 - 1.0 mg/dL   Nitrite POSITIVE (*) NEGATIVE   Leukocytes, UA LARGE (*) NEGATIVE  URINE MICROSCOPIC-ADD ON      Component Value Range   WBC, UA 21-50  <3 WBC/hpf   RBC / HPF TOO NUMEROUS TO COUNT  <3 RBC/hpf   Bacteria, UA RARE  RARE   No results found.    MDM  Spoke with Dr.Memon Plan 23 hour observation MedSurg floor Diagnosis warfarin coagulopathy        Doug Sou, MD 05/02/12 2129

## 2012-05-02 NOTE — ED Notes (Signed)
Pt presents to department from [facility] for evaluation of hematuria and abdominal discomfort. Pt takes Coumadin for Afib and levels are elevated. Onset of symptoms last night. 1/10 abdominal pain at the time. Pt also states multiple bruises all over arms. She is conscious alert and oriented x4. Ambulatory to triage.

## 2012-05-02 NOTE — ED Notes (Signed)
Pt son contacted to inform him of patient room number.

## 2012-05-02 NOTE — ED Notes (Signed)
Family member has asked multiple times when the patient is going to a room.  Explained to patient that once a room becomes available, patient will be taken back.  Apologized about the wait and reassured that patient will be in a room as soon as possible.

## 2012-05-02 NOTE — H&P (Signed)
[facility] History and Physical  [name] [name] [name] [name]:[id number] DOB: 14-Oct-1933 DOA: 05/02/2012  Referring physician: Dr. Ethelda Chick PCP: Mickie Hillier, MD  Specialists: Gastroenterologist: Dr. Juanda Chance  Chief Complaint: hematuria  HPI: [name] [name] is a 78 y.o. female with history of atrial fibrillation on Coumadin who developed sudden onset of hematuria last night. Her symptoms persisted this morning and therefore prompted a visit to her primary care physician. Her INR was found to be above 8 and therefore she was sent to the ER for evaluation. She denies any melena hematochezia, no abdominal pain. She's not had any lightheadedness, syncope, chest pain or shortness of breath. She has not had any dysuria, fever, cough or any other symptoms. She was evaluated in the emergency room and her INR was found to be above 6. Her hemoglobin is stable at her baseline level of 14. Stools were found to be brown, but Hemoccult positive. She has noted increased bruising. She's not been on any antibiotics recently. She also describes worsening lower extremity edema, although this has been somewhat of a chronic problem. Patient's has been referred for admission for observation.  Review of Systems: Pertinent positives as per HPI, otherwise negative.  Past Medical History  Diagnosis Date  . Atrial fibrillation   . Hypertension    No past surgical history on file. Social History:  reports that she has never smoked. She does not have any smokeless tobacco history on file. She reports that she drinks alcohol. She reports that she does not use illicit drugs. Lives at home with her son, ambulates with the assistance of a walker   No Known Allergies  Family history: Mother died of heart disease, father died of cancer   Prior to Admission medications   Medication Sig Start Date End Date Taking? Authorizing Provider  aspirin 81 MG chewable tablet Chew 81 mg by mouth daily.   Yes Historical Provider, MD   bisoprolol-hydrochlorothiazide (ZIAC) 10-6.25 MG per tablet Take 1 tablet by mouth every morning.   Yes Historical Provider, MD  bisoprolol-hydrochlorothiazide (ZIAC) 5-6.25 MG per tablet Take 1 tablet by mouth every evening.   Yes Historical Provider, MD  Calcium Carbonate-Vitamin D (CALTRATE 600+D PO) Take 1 tablet by mouth every Monday, Wednesday, and Friday.   Yes Historical Provider, MD  diltiazem (CARDIZEM LA) 120 MG 24 hr tablet Take 120 mg by mouth every morning.   Yes Historical Provider, MD  fluvastatin XL (LESCOL XL) 80 MG 24 hr tablet Take 80 mg by mouth every evening.   Yes Historical Provider, MD  hydrocortisone cream 1 % Apply 1 application topically 3 (three) times daily as needed. For itching under breasts   Yes Historical Provider, MD  loperamide (IMODIUM) 2 MG capsule Take 2 mg by mouth 4 (four) times daily as needed. For diarrhea   Yes Historical Provider, MD  losartan (COZAAR) 100 MG tablet Take 100 mg by mouth daily.   Yes Historical Provider, MD  oxybutynin (DITROPAN) 5 MG tablet Take 5 mg by mouth 2 (two) times daily.   Yes Historical Provider, MD  ranitidine (ZANTAC) 75 MG tablet Take 75 mg by mouth every evening.   Yes Historical Provider, MD  warfarin (COUMADIN) 5 MG tablet Take 5 mg by mouth every evening.   Yes Historical Provider, MD   Physical Exam: Filed Vitals:   05/02/12 1614 05/02/12 1851  BP: 144/87 107/62  Pulse: 83 85  Temp: 98.7 F (37.1 C)   TempSrc: Oral   Resp: 18 18  SpO2:  94% 97%     General:  No acute distress, sitting comfortably in bed  Eyes: Pupils are equal round react to light  ENT: Mucous members are moist, no pharyngeal erythema  Neck: Supple  Cardiovascular: S1, S2, irregular rate and rhythm, 1-2+ pedal edema bilaterally  Respiratory: Clear to auscultation bilaterally  Abdomen: Soft, nontender, nondistended, bowel sounds are active  Skin: No bruises noted on her extremities   Musculoskeletal: Deferred  Psychiatric:  Normal affect, cooperative with exam   Neurologic: Grossly intact, nonfocal  Labs on Admission:  Basic Metabolic Panel:  Lab 05/02/12 1610  NA 143  K 3.9  CL 105  CO2 26  GLUCOSE 97  BUN 24*  CREATININE 1.22*  CALCIUM 9.4  MG --  PHOS --   Liver Function Tests:  Lab 05/02/12 1617  AST 23  ALT 17  ALKPHOS 78  BILITOT 0.7  PROT 7.0  ALBUMIN 3.7   No results found for this basename: LIPASE:5,AMYLASE:5 in the last 168 hours No results found for this basename: AMMONIA:5 in the last 168 hours CBC:  Lab 05/02/12 1617  WBC 7.5  NEUTROABS 4.8  HGB 13.9  HCT 41.2  MCV 92.0  PLT 286   Cardiac Enzymes: No results found for this basename: CKTOTAL:5,CKMB:5,CKMBINDEX:5,TROPONINI:5 in the last 168 hours  BNP (last 3 results) No results found for this basename: PROBNP:3 in the last 8760 hours CBG: No results found for this basename: GLUCAP:5 in the last 168 hours  Radiological Exams on Admission: No results found.   Assessment/Plan Principal Problem:  *Hematuria Active Problems:  Atrial fibrillation  Supratherapeutic INR  Coumadin toxicity  UTI (urinary tract infection)  HTN (hypertension)  Hyperlipidemia  Pedal edema   1. Hematuria in the setting of supratherapeutic INR. Although patient continues to have hematuria, her hemoglobin appears stable from last year. She does not require transfusion at this time. We will hold Coumadin. If hemoglobin starts to drop, or her bleeding become significantly worse, her INR will need to be reversed with vitamin K. For now, since she does not appear to be having any life threatening bleeding (intracranial, GI, or intramuscular) and has no signs of hemodynamic instability, we will hold Coumadin and monitor INR levels. 2. Heme positive stools. Patient has not had any gross bleeding melena or hematochezia. Case was discussed with Dr. Christella Hartigan from [facility]. Recommendations were to followup with Dr. Juanda Chance as an outpatient, once her  coagulation profile has improved. If the patient does develop gross GI bleeding, then gastroenterology should be called back and they will need to see her as an inpatient 3. Urinary tract infection. Patient's urinalysis indicated positive nitrites. We will send urine culture and start the patient empirically on Rocephin. 4. Pedal edema. We'll check a BNP as well as 2-D echocardiogram. If these are found to be unremarkable, then she can followup with Dr. Anne Fu as previously scheduled as an outpatient. Will start her on low-dose Lasix for now. 5. Atrial fibrillation. Rate controlled. Resume Coumadin once coagulation profile is improved.   Code Status: full code, confirmed with patient Family Communication: Discussed with patient and son at the bedside Disposition Plan: discharge home once medically stable  Time spent:  Ratasha Fabre [facility] Pager [phone]  If 7PM-7AM, please contact night-coverage [email or web address] Password [email or web address] 05/02/2012, 10:14 PM

## 2012-05-02 NOTE — ED Notes (Signed)
Pt in c/o hematuria since last night, denies pain, pt states she has been taking coumadin since October 8th, was seen at PMD today and levels were high, sent to ED for further evaluation.

## 2012-05-03 DIAGNOSIS — I10 Essential (primary) hypertension: Secondary | ICD-10-CM | POA: Diagnosis not present

## 2012-05-03 DIAGNOSIS — R609 Edema, unspecified: Secondary | ICD-10-CM | POA: Diagnosis not present

## 2012-05-03 DIAGNOSIS — N390 Urinary tract infection, site not specified: Secondary | ICD-10-CM

## 2012-05-03 DIAGNOSIS — I4891 Unspecified atrial fibrillation: Secondary | ICD-10-CM | POA: Diagnosis not present

## 2012-05-03 DIAGNOSIS — R319 Hematuria, unspecified: Secondary | ICD-10-CM | POA: Diagnosis not present

## 2012-05-03 LAB — BASIC METABOLIC PANEL
CO2: 27 mEq/L (ref 19–32)
Chloride: 105 mEq/L (ref 96–112)
Glucose, Bld: 134 mg/dL — ABNORMAL HIGH (ref 70–99)
Potassium: 3.5 mEq/L (ref 3.5–5.1)
Sodium: 142 mEq/L (ref 135–145)

## 2012-05-03 LAB — CBC
Hemoglobin: 12 g/dL (ref 12.0–15.0)
Platelets: 235 10*3/uL (ref 150–400)
RBC: 3.87 MIL/uL (ref 3.87–5.11)
WBC: 6.3 10*3/uL (ref 4.0–10.5)

## 2012-05-03 LAB — PROTIME-INR
INR: 6.08 (ref 0.00–1.49)
Prothrombin Time: 49.9 seconds — ABNORMAL HIGH (ref 11.6–15.2)

## 2012-05-03 MED ORDER — POTASSIUM CHLORIDE CRYS ER 20 MEQ PO TBCR
40.0000 meq | EXTENDED_RELEASE_TABLET | Freq: Once | ORAL | Status: AC
  Administered 2012-05-03: 40 meq via ORAL
  Filled 2012-05-03: qty 2

## 2012-05-03 NOTE — Progress Notes (Signed)
[facility] PROGRESS NOTE  [name] [name] YNW:[id number] DOB: 08-Jul-1933 DOA: 05/02/2012 PCP: Mickie Hillier, MD  Assessment/Plan:  #1 hematuria Likely secondary to supratherapeutic INR. INR today 6.08 and slowly trending back down. Patient with continued hematuria. Patient hemodynamically stable. Hemoglobin is stable at 12.0. We'll continue to hold Coumadin and will not resume until one to 2 weeks after her hematuria has resolved. Follow.  #2 urinary tract infection Urine cultures are pending. IV Rocephin.  #3 atrial fibrillation Currently rate controlled on diltiazem and bisoprolol. Coumadin on hold secondary to problem #1. We'll not resume Coumadin after one to 2 weeks after her hematuria has resolved. Patient will followup with cardiology as outpatient.  #4 heme positive stools Patient with no gross overt melena or hematochezia. Hemoglobin is currently 12. Followup with GI as outpatient.  #5 pedal edema BNP is elevated however patient with nonpitting edema. Patient is satting at 98% on room air. 2-D echo with a normal EF with no wall motion abnormalities. Continue empiric low-dose Lasix. Followup with cardiology as outpatient.   #6 hypertension Stable. Continue bisoprolol HCTZ, Cozaar.  #7 supratherapeutic INR Coumadin on hold. Follow PT/INR.  #8 hyperlipidemia Continue statin.  #9 prophylaxis Pepcid for GI prophylaxis. INR is supratherapeutic.  Code Status: Full Family Communication: Updated [patient. No family at bedside. Disposition Plan: Home when medically stable   Consultants:  None  Procedures:  2 d echo 05/03/12  Antibiotics:  IV Rocephin 05/03/12  HPI/Subjective: Patient with continued hematuria, however hemodynamically stable.  Objective: Filed Vitals:   05/03/12 0500 05/03/12 0519 05/03/12 1045 05/03/12 1500  BP:  123/71 144/90 103/64  Pulse:  102 88 105  Temp:  98.7 F (37.1 C) 98.4 F (36.9 C) 97.9 F (36.6 C)  TempSrc:   Oral  Oral  Resp:  18 20 20   Height:      Weight: 117.981 kg (260 lb 1.6 oz)     SpO2:  97% 98% 97%    Intake/Output Summary (Last 24 hours) at 05/03/12 1703 Last data filed at 05/03/12 1300  Gross per 24 hour  Intake    480 ml  Output    400 ml  Net     80 ml   Filed Weights   05/02/12 2244 05/03/12 0500  Weight: 116.937 kg (257 lb 12.8 oz) 117.981 kg (260 lb 1.6 oz)    Exam:   General:  NAD  Cardiovascular: RRR. Nonpitting edema  Respiratory: CTAB  Abdomen: Soft/NT/ND/+BS  Data Reviewed: Basic Metabolic Panel:  Lab 05/03/12 6578 05/02/12 1617  NA 142 143  K 3.5 3.9  CL 105 105  CO2 27 26  GLUCOSE 134* 97  BUN 23 24*  CREATININE 1.01 1.22*  CALCIUM 8.6 9.4  MG -- --  PHOS -- --   Liver Function Tests:  Lab 05/02/12 1617  AST 23  ALT 17  ALKPHOS 78  BILITOT 0.7  PROT 7.0  ALBUMIN 3.7   No results found for this basename: LIPASE:5,AMYLASE:5 in the last 168 hours No results found for this basename: AMMONIA:5 in the last 168 hours CBC:  Lab 05/03/12 0500 05/02/12 1617  WBC 6.3 7.5  NEUTROABS -- 4.8  HGB 12.0 13.9  HCT 36.1 41.2  MCV 93.3 92.0  PLT 235 286   Cardiac Enzymes: No results found for this basename: CKTOTAL:5,CKMB:5,CKMBINDEX:5,TROPONINI:5 in the last 168 hours BNP (last 3 results)  Basename 05/03/12 0001  PROBNP 2111.0*   CBG: No results found for this basename: GLUCAP:5 in the last 168 hours  No results found for this or any previous visit (from the past 240 hour(s)).   Studies: No results found.  Scheduled Meds:   . bisoprolol-hydrochlorothiazide  1 tablet Oral Daily  . bisoprolol-hydrochlorothiazide  1 tablet Oral QPM  . cefTRIAXone (ROCEPHIN)  IV  1 g Intravenous Q24H  . diltiazem  120 mg Oral Daily  . famotidine  20 mg Oral QHS  . furosemide  20 mg Oral Daily  . losartan  100 mg Oral Daily  . oxybutynin  5 mg Oral BID  . [COMPLETED] potassium chloride  40 mEq Oral Once  . rosuvastatin  10 mg Oral q1800  .  [DISCONTINUED] bisoprolol-hydrochlorothiazide  1 tablet Oral BH-q7a  . [DISCONTINUED] losartan  100 mg Oral Daily  . [DISCONTINUED] simvastatin  40 mg Oral q1800   Continuous Infusions:   Principal Problem:  *Hematuria Active Problems:  Atrial fibrillation  Supratherapeutic INR  Coumadin toxicity  UTI (urinary tract infection)  HTN (hypertension)  Hyperlipidemia  Pedal edema    Time spent: > 35 mins    [facility]  [facility] Pager [phone]. If 8PM-8AM, please contact night-coverage at [email or web address], password [facility] 05/03/2012, 5:03 PM  LOS: 1 day

## 2012-05-03 NOTE — Progress Notes (Signed)
  Echocardiogram 2D Echocardiogram has been performed.  [name] [name] 05/03/2012, 10:34 AM

## 2012-05-04 DIAGNOSIS — R319 Hematuria, unspecified: Secondary | ICD-10-CM | POA: Diagnosis not present

## 2012-05-04 DIAGNOSIS — N390 Urinary tract infection, site not specified: Secondary | ICD-10-CM | POA: Diagnosis not present

## 2012-05-04 DIAGNOSIS — I10 Essential (primary) hypertension: Secondary | ICD-10-CM | POA: Diagnosis not present

## 2012-05-04 DIAGNOSIS — I4891 Unspecified atrial fibrillation: Secondary | ICD-10-CM | POA: Diagnosis not present

## 2012-05-04 LAB — CBC
HCT: 35.2 % — ABNORMAL LOW (ref 36.0–46.0)
Hemoglobin: 11.6 g/dL — ABNORMAL LOW (ref 12.0–15.0)
RBC: 3.8 MIL/uL — ABNORMAL LOW (ref 3.87–5.11)
RDW: 13.5 % (ref 11.5–15.5)
WBC: 6 10*3/uL (ref 4.0–10.5)

## 2012-05-04 LAB — BASIC METABOLIC PANEL
Chloride: 102 mEq/L (ref 96–112)
Creatinine, Ser: 1.11 mg/dL — ABNORMAL HIGH (ref 0.50–1.10)
GFR calc Af Amer: 54 mL/min — ABNORMAL LOW (ref >90)
Potassium: 3.8 mEq/L (ref 3.5–5.1)
Sodium: 139 mEq/L (ref 135–145)

## 2012-05-04 LAB — URINE CULTURE: Colony Count: 3000

## 2012-05-04 LAB — PROTIME-INR: INR: 3.84 — ABNORMAL HIGH (ref 0.00–1.49)

## 2012-05-04 NOTE — Progress Notes (Signed)
[facility] PROGRESS NOTE  [name] [name] YNW:[id number] DOB: 15-May-1934 DOA: 05/02/2012 PCP: Mickie Hillier, MD  Assessment/Plan:  #1 hematuria Likely secondary to supratherapeutic INR. INR today 3.84 from 6.08 and slowly trending back down. Patient with improving hematuria. Patient hemodynamically stable. Hemoglobin is stable at 11.6. Will continue to hold Coumadin and will not resume until one to 2 weeks after her hematuria has resolved. Follow.  #2 urinary tract infection Urine cultures are pending. IV Rocephin.  #3 atrial fibrillation Currently rate controlled on diltiazem and bisoprolol. Coumadin on hold secondary to problem #1. We'll not resume Coumadin after one to 2 weeks after her hematuria has resolved. Patient will followup with cardiology as outpatient.  #4 heme positive stools Patient with no gross overt melena or hematochezia. Hemoglobin is currently 11.6. Followup with GI as outpatient.  #5 pedal edema BNP is elevated however patient with nonpitting edema. Patient is satting at 98% on room air. 2-D echo with a normal EF with no wall motion abnormalities. Continue empiric low-dose Lasix. Followup with cardiology as outpatient.   #6 hypertension Stable. Continue bisoprolol HCTZ, Cozaar.  #7 supratherapeutic INR Coumadin on hold. Follow PT/INR.  #8 hyperlipidemia Continue statin.  #9 prophylaxis Pepcid for GI prophylaxis. INR is supratherapeutic.  Code Status: Full Family Communication: Updated [patient. No family at bedside. Disposition Plan: Home when medically stable   Consultants:  None  Procedures:  2 d echo 05/03/12  Antibiotics:  IV Rocephin 05/03/12  HPI/Subjective: Patient states hematuria is improving.  Objective: Filed Vitals:   05/03/12 1800 05/03/12 2023 05/04/12 0500 05/04/12 0629  BP: 122/81 129/54  124/85  Pulse: 75   85  Temp: 97.5 F (36.4 C) 97.7 F (36.5 C)  98.1 F (36.7 C)  TempSrc: Oral Oral    Resp: 20 18  18    Height:      Weight:   117.981 kg (260 lb 1.6 oz)   SpO2: 100% 97%  100%    Intake/Output Summary (Last 24 hours) at 05/04/12 1622 Last data filed at 05/04/12 1300  Gross per 24 hour  Intake    720 ml  Output      1 ml  Net    719 ml   Filed Weights   05/02/12 2244 05/03/12 0500 05/04/12 0500  Weight: 116.937 kg (257 lb 12.8 oz) 117.981 kg (260 lb 1.6 oz) 117.981 kg (260 lb 1.6 oz)    Exam:   General:  NAD  Cardiovascular: RRR. Nonpitting edema  Respiratory: CTAB  Abdomen: Soft/NT/ND/+BS  Data Reviewed: Basic Metabolic Panel:  Lab 05/04/12 6578 05/03/12 0500 05/02/12 1617  NA 139 142 143  K 3.8 3.5 3.9  CL 102 105 105  CO2 27 27 26   GLUCOSE 118* 134* 97  BUN 23 23 24*  CREATININE 1.11* 1.01 1.22*  CALCIUM 8.8 8.6 9.4  MG -- -- --  PHOS -- -- --   Liver Function Tests:  Lab 05/02/12 1617  AST 23  ALT 17  ALKPHOS 78  BILITOT 0.7  PROT 7.0  ALBUMIN 3.7   No results found for this basename: LIPASE:5,AMYLASE:5 in the last 168 hours No results found for this basename: AMMONIA:5 in the last 168 hours CBC:  Lab 05/04/12 0648 05/03/12 0500 05/02/12 1617  WBC 6.0 6.3 7.5  NEUTROABS -- -- 4.8  HGB 11.6* 12.0 13.9  HCT 35.2* 36.1 41.2  MCV 92.6 93.3 92.0  PLT 240 235 286   Cardiac Enzymes: No results found for this basename: CKTOTAL:5,CKMB:5,CKMBINDEX:5,TROPONINI:5 in the  last 168 hours BNP (last 3 results)  Basename 05/03/12 0001  PROBNP 2111.0*   CBG: No results found for this basename: GLUCAP:5 in the last 168 hours  Recent Results (from the past 240 hour(s))  URINE CULTURE     Status: Normal   Collection Time   05/03/12  3:30 AM      Component Value Range Status Comment   Specimen Description URINE, RANDOM   Final    Special Requests NONE   Final    Culture  Setup Time 05/03/2012 09:00   Final    Colony Count 3,000 COLONIES/ML   Final    Culture INSIGNIFICANT GROWTH   Final    Report Status 05/04/2012 FINAL   Final      Studies: No  results found.  Scheduled Meds:    . bisoprolol-hydrochlorothiazide  1 tablet Oral Daily  . bisoprolol-hydrochlorothiazide  1 tablet Oral QPM  . cefTRIAXone (ROCEPHIN)  IV  1 g Intravenous Q24H  . diltiazem  120 mg Oral Daily  . famotidine  20 mg Oral QHS  . furosemide  20 mg Oral Daily  . losartan  100 mg Oral Daily  . oxybutynin  5 mg Oral BID  . rosuvastatin  10 mg Oral q1800   Continuous Infusions:   Principal Problem:  *Hematuria Active Problems:  Atrial fibrillation  Supratherapeutic INR  Coumadin toxicity  UTI (urinary tract infection)  HTN (hypertension)  Hyperlipidemia  Pedal edema    Time spent: > 35 mins    [facility]  [facility] Pager [phone]. If 8PM-8AM, please contact night-coverage at [email or web address], password [email or web address] 05/04/2012, 4:22 PM  LOS: 2 days

## 2012-05-05 DIAGNOSIS — R319 Hematuria, unspecified: Secondary | ICD-10-CM | POA: Diagnosis not present

## 2012-05-05 DIAGNOSIS — R791 Abnormal coagulation profile: Secondary | ICD-10-CM

## 2012-05-05 DIAGNOSIS — I4891 Unspecified atrial fibrillation: Secondary | ICD-10-CM | POA: Diagnosis not present

## 2012-05-05 DIAGNOSIS — I10 Essential (primary) hypertension: Secondary | ICD-10-CM | POA: Diagnosis not present

## 2012-05-05 LAB — BASIC METABOLIC PANEL
BUN: 24 mg/dL — ABNORMAL HIGH (ref 6–23)
Chloride: 102 mEq/L (ref 96–112)
GFR calc Af Amer: 53 mL/min — ABNORMAL LOW (ref >90)
Potassium: 4 mEq/L (ref 3.5–5.1)

## 2012-05-05 LAB — CBC
HCT: 34.3 % — ABNORMAL LOW (ref 36.0–46.0)
Platelets: 243 10*3/uL (ref 150–400)
RBC: 3.67 MIL/uL — ABNORMAL LOW (ref 3.87–5.11)
RDW: 13.3 % (ref 11.5–15.5)
WBC: 5.9 10*3/uL (ref 4.0–10.5)

## 2012-05-05 LAB — PROTIME-INR: Prothrombin Time: 25.2 seconds — ABNORMAL HIGH (ref 11.6–15.2)

## 2012-05-05 MED ORDER — FUROSEMIDE 20 MG PO TABS: 20.0000 mg | ORAL_TABLET | Freq: Every day | ORAL | Status: DC

## 2012-05-05 NOTE — Progress Notes (Signed)
Pt discharged to home discharge instructions explained pt verbalized understanding 

## 2012-05-05 NOTE — Progress Notes (Signed)
0430 Patient still continues to void bright red blood no clots noted.

## 2012-05-05 NOTE — Discharge Summary (Signed)
Physician Discharge Summary  [name] [name] ZOX:[id number] DOB: Jan 28, 1934 DOA: 05/02/2012  PCP: Mickie Hillier, MD  Admit date: 05/02/2012 Discharge date: 05/05/2012  Time spent: 60 minutes  Recommendations for Outpatient Follow-up:  1. Followup with PCP one week post discharge. On follow patient's hematuria will need to be reassessed. Patient's Coumadin and aspirin have been held and resumption of this will be deferred to patient's PCP and cardiologist. Patient will need a CBC checked as well as a PT/INR done on followup. 2. Patient is to followup with the cardiologist as scheduled. Cardiologist to decide on anticoagulation resumption.  Discharge Diagnoses:  Principal Problem:  *Hematuria Active Problems:  Atrial fibrillation  Supratherapeutic INR  Coumadin toxicity  UTI (urinary tract infection)  HTN (hypertension)  Hyperlipidemia  Pedal edema   Discharge Condition: Stable and improved  Diet recommendation: Heart healthy  Filed Weights   05/03/12 0500 05/04/12 0500 05/05/12 0500  Weight: 117.981 kg (260 lb 1.6 oz) 117.981 kg (260 lb 1.6 oz) 120.43 kg (265 lb 8 oz)    History of present illness:  [name] [name] is a 78 y.o. female with history of atrial fibrillation on Coumadin who developed sudden onset of hematuria last night. Her symptoms persisted this morning and therefore prompted a visit to her primary care physician. Her INR was found to be above 8 and therefore she was sent to the ER for evaluation. She denies any melena hematochezia, no abdominal pain. She's not had any lightheadedness, syncope, chest pain or shortness of breath. She has not had any dysuria, fever, cough or any other symptoms. She was evaluated in the emergency room and her INR was found to be above 6. Her hemoglobin is stable at her baseline level of 14. Stools were found to be brown, but Hemoccult positive. She has noted increased bruising. She's not been on any antibiotics recently. She also  describes worsening lower extremity edema, although this has been somewhat of a chronic problem. Patient's has been referred for admission for observation.   Hospital Course:  #1 hematuria Patient was admitted with hematuria the supratherapeutic INR. Admission patient was noted to have a INR of 6.12. Patient's hemoglobin on admission was 13.9. Patient was admitted and monitored. Her hemoglobin was also followed and Coumadin was discontinued. Patient's aspirin was also discontinued. Patient was monitored and followed. Patient remained hemodynamically stable. Patient's PT INR trended down such that by day of discharge her INR was down to 2.42. Patient's hematuria slowly improved on a daily basis such that by day of discharge patient did state that she no longer had right red blood clots and had very light pink urine. Patient's hemoglobin also stabilized at 11.5 on day of discharge. Patient will be discharged home off of Coumadin and aspirin for the next one to 2 weeks and would defer resumption of anticoagulation to her primary care physician and cardiologist. Patient be discharged in stable and improved condition.  #2 atrial fibrillation Patient was admitted to the telemetry floor on admission secondary to a hematuria and noted to have a history of atrial fibrillation. Patient was maintained on her diltiazem and metoprolol for rate control. Patient's Coumadin was held secondary to hematuria and was not resumed. Patient will be discharged home off her anti-coagulation for at least the next one to 2 weeks and further evaluation per primary care physician as outpatient and cardiologist. Patient be discharged in stable and improved condition.  #3 urinary tract infection On admission urinalysis which was done was consistent with a UTI.  Patient was placed on empiric IV Rocephin. Urine cultures came back with insignificant growth. Patient had received 3 days of IV Rocephin and did not need any further  treatment.  #4 pedal edema On admission patient was noted to have some pedal edema. BNP was elevated however patient remained on 100% on room at. Patient's edema was nonpitting. 2-D echo was done which showed a normal ejection fraction with no wall motion abnormalities. Patient was placed on low-dose Lasix at 20 mg daily. Patient will followup with a cardiologist as outpatient.  #5 supratherapeutic INR On admission patient was noted to have a supratherapeutic INR with a high narrow 6.12. Patient's had presented with hematuria. Patient's Coumadin was held during this hospitalization and was not resumed. Patient's hematuria slowly improved during the course of the hospitalization. Patient's INR was monitored and trended down such that by day of discharge patient's INR was 2.42. Patient be discharged off of her Coumadin and will followup with PCP and cardiologist as outpatient.  The rest of patient's chronic medical issues remained stable throughout the hospitalization and patient will be discharged in stable and improved condition.   Procedures:  2-D echo 05/03/2012  Consultations:  None  Discharge Exam: Filed Vitals:   05/04/12 2201 05/05/12 0500 05/05/12 0506 05/05/12 1320  BP: 129/81  121/76 114/82  Pulse: 76  81 79  Temp: 98.2 F (36.8 C)  98.4 F (36.9 C) 98.2 F (36.8 C)  TempSrc:    Oral  Resp: 20  19 20   Height:      Weight:  120.43 kg (265 lb 8 oz)    SpO2: 96%  95% 99%    General: NAD Cardiovascular: RRR Respiratory: CTAB  Discharge Instructions  Discharge Orders    Future Orders Please Complete By Expires   Diet - low sodium heart healthy      Increase activity slowly      Discharge instructions      Comments:   Follow up with Mickie Hillier, MD in 1 week Follow up with Dr Anne Fu as scheduled.       Medication List     As of 05/05/2012  3:36 PM    STOP taking these medications         aspirin 81 MG chewable tablet      warfarin 5 MG tablet    Commonly known as: COUMADIN      TAKE these medications         bisoprolol-hydrochlorothiazide 10-6.25 MG per tablet   Commonly known as: ZIAC   Take 1 tablet by mouth every morning.      bisoprolol-hydrochlorothiazide 5-6.25 MG per tablet   Commonly known as: ZIAC   Take 1 tablet by mouth every evening.      CALTRATE 600+D PO   Take 1 tablet by mouth every Monday, Wednesday, and Friday.      diltiazem 120 MG 24 hr tablet   Commonly known as: CARDIZEM LA   Take 120 mg by mouth every morning.      fluvastatin XL 80 MG 24 hr tablet   Commonly known as: LESCOL XL   Take 80 mg by mouth every evening.      furosemide 20 MG tablet   Commonly known as: LASIX   Take 1 tablet (20 mg total) by mouth daily.      hydrocortisone cream 1 %   Apply 1 application topically 3 (three) times daily as needed. For itching under breasts      loperamide 2  MG capsule   Commonly known as: IMODIUM   Take 2 mg by mouth 4 (four) times daily as needed. For diarrhea      losartan 100 MG tablet   Commonly known as: COZAAR   Take 100 mg by mouth daily.      oxybutynin 5 MG tablet   Commonly known as: DITROPAN   Take 5 mg by mouth 2 (two) times daily.      ranitidine 75 MG tablet   Commonly known as: ZANTAC   Take 75 mg by mouth every evening.           Follow-up Information    Follow up with Mickie Hillier, MD. Schedule an appointment as soon as possible for a visit in 1 week.   Contact information:   [address] [place] [address] [phone]       Follow up with Donato Schultz, MD. (f/u as scheduled)    Contact information:   [address] [place] [address] [phone]           The results of significant diagnostics from this hospitalization (including imaging, microbiology, ancillary and laboratory) are listed below for reference.    Significant Diagnostic Studies: No results found.  Microbiology: Recent Results (from the past 240 hour(s))   URINE CULTURE     Status: Normal   Collection Time   05/03/12  3:30 AM      Component Value Range Status Comment   Specimen Description URINE, RANDOM   Final    Special Requests NONE   Final    Culture  Setup Time 05/03/2012 09:00   Final    Colony Count 3,000 COLONIES/ML   Final    Culture INSIGNIFICANT GROWTH   Final    Report Status 05/04/2012 FINAL   Final      Labs: Basic Metabolic Panel:  Lab 05/05/12 5784 05/04/12 0648 05/03/12 0500 05/02/12 1617  NA 140 139 142 143  K 4.0 3.8 3.5 3.9  CL 102 102 105 105  CO2 30 27 27 26   GLUCOSE 124* 118* 134* 97  BUN 24* 23 23 24*  CREATININE 1.14* 1.11* 1.01 1.22*  CALCIUM 8.9 8.8 8.6 9.4  MG -- -- -- --  PHOS -- -- -- --   Liver Function Tests:  Lab 05/02/12 1617  AST 23  ALT 17  ALKPHOS 78  BILITOT 0.7  PROT 7.0  ALBUMIN 3.7   No results found for this basename: LIPASE:5,AMYLASE:5 in the last 168 hours No results found for this basename: AMMONIA:5 in the last 168 hours CBC:  Lab 05/05/12 0520 05/04/12 0648 05/03/12 0500 05/02/12 1617  WBC 5.9 6.0 6.3 7.5  NEUTROABS -- -- -- 4.8  HGB 11.5* 11.6* 12.0 13.9  HCT 34.3* 35.2* 36.1 41.2  MCV 93.5 92.6 93.3 92.0  PLT 243 240 235 286   Cardiac Enzymes: No results found for this basename: CKTOTAL:5,CKMB:5,CKMBINDEX:5,TROPONINI:5 in the last 168 hours BNP: BNP (last 3 results)  Basename 05/03/12 0001  PROBNP 2111.0*   CBG: No results found for this basename: GLUCAP:5 in the last 168 hours     Signed:  THOMPSON,DANIEL  [facility] 05/05/2012, 3:36 PM

## 2012-05-08 DIAGNOSIS — I10 Essential (primary) hypertension: Secondary | ICD-10-CM | POA: Diagnosis not present

## 2012-05-08 DIAGNOSIS — E119 Type 2 diabetes mellitus without complications: Secondary | ICD-10-CM | POA: Diagnosis not present

## 2012-05-08 DIAGNOSIS — I4891 Unspecified atrial fibrillation: Secondary | ICD-10-CM | POA: Diagnosis not present

## 2012-05-08 DIAGNOSIS — R319 Hematuria, unspecified: Secondary | ICD-10-CM | POA: Diagnosis not present

## 2012-05-08 DIAGNOSIS — E7800 Pure hypercholesterolemia, unspecified: Secondary | ICD-10-CM | POA: Diagnosis not present

## 2012-05-10 DIAGNOSIS — I4891 Unspecified atrial fibrillation: Secondary | ICD-10-CM | POA: Diagnosis not present

## 2012-05-15 DIAGNOSIS — I4891 Unspecified atrial fibrillation: Secondary | ICD-10-CM | POA: Diagnosis not present

## 2012-05-17 DIAGNOSIS — R319 Hematuria, unspecified: Secondary | ICD-10-CM | POA: Diagnosis not present

## 2012-05-17 DIAGNOSIS — Z1331 Encounter for screening for depression: Secondary | ICD-10-CM | POA: Diagnosis not present

## 2012-05-17 DIAGNOSIS — N289 Disorder of kidney and ureter, unspecified: Secondary | ICD-10-CM | POA: Diagnosis not present

## 2012-05-17 DIAGNOSIS — I4891 Unspecified atrial fibrillation: Secondary | ICD-10-CM | POA: Diagnosis not present

## 2012-05-18 DIAGNOSIS — H2510 Age-related nuclear cataract, unspecified eye: Secondary | ICD-10-CM | POA: Diagnosis not present

## 2012-05-18 DIAGNOSIS — Z1331 Encounter for screening for depression: Secondary | ICD-10-CM | POA: Diagnosis not present

## 2012-05-18 DIAGNOSIS — H35359 Cystoid macular degeneration, unspecified eye: Secondary | ICD-10-CM | POA: Diagnosis not present

## 2012-05-18 DIAGNOSIS — N289 Disorder of kidney and ureter, unspecified: Secondary | ICD-10-CM | POA: Diagnosis not present

## 2012-05-18 DIAGNOSIS — R319 Hematuria, unspecified: Secondary | ICD-10-CM | POA: Diagnosis not present

## 2012-05-19 ENCOUNTER — Encounter: Payer: Medicare Other | Admitting: Internal Medicine

## 2012-05-19 ENCOUNTER — Encounter (INDEPENDENT_AMBULATORY_CARE_PROVIDER_SITE_OTHER): Payer: Medicare Other | Admitting: Ophthalmology

## 2012-05-19 DIAGNOSIS — H43819 Vitreous degeneration, unspecified eye: Secondary | ICD-10-CM

## 2012-05-19 DIAGNOSIS — H33309 Unspecified retinal break, unspecified eye: Secondary | ICD-10-CM

## 2012-05-19 DIAGNOSIS — I10 Essential (primary) hypertension: Secondary | ICD-10-CM

## 2012-05-19 DIAGNOSIS — H2510 Age-related nuclear cataract, unspecified eye: Secondary | ICD-10-CM

## 2012-05-19 DIAGNOSIS — H35039 Hypertensive retinopathy, unspecified eye: Secondary | ICD-10-CM

## 2012-05-19 DIAGNOSIS — H35359 Cystoid macular degeneration, unspecified eye: Secondary | ICD-10-CM

## 2012-05-31 DIAGNOSIS — N3000 Acute cystitis without hematuria: Secondary | ICD-10-CM | POA: Diagnosis not present

## 2012-05-31 DIAGNOSIS — R229 Localized swelling, mass and lump, unspecified: Secondary | ICD-10-CM | POA: Diagnosis not present

## 2012-05-31 DIAGNOSIS — E119 Type 2 diabetes mellitus without complications: Secondary | ICD-10-CM | POA: Diagnosis not present

## 2012-05-31 DIAGNOSIS — N289 Disorder of kidney and ureter, unspecified: Secondary | ICD-10-CM | POA: Diagnosis not present

## 2012-05-31 DIAGNOSIS — E7800 Pure hypercholesterolemia, unspecified: Secondary | ICD-10-CM | POA: Diagnosis not present

## 2012-05-31 DIAGNOSIS — R300 Dysuria: Secondary | ICD-10-CM | POA: Diagnosis not present

## 2012-05-31 DIAGNOSIS — R32 Unspecified urinary incontinence: Secondary | ICD-10-CM | POA: Diagnosis not present

## 2012-05-31 DIAGNOSIS — I4891 Unspecified atrial fibrillation: Secondary | ICD-10-CM | POA: Diagnosis not present

## 2012-06-06 DIAGNOSIS — H269 Unspecified cataract: Secondary | ICD-10-CM | POA: Diagnosis not present

## 2012-06-06 DIAGNOSIS — H2510 Age-related nuclear cataract, unspecified eye: Secondary | ICD-10-CM | POA: Diagnosis not present

## 2012-06-09 ENCOUNTER — Encounter (INDEPENDENT_AMBULATORY_CARE_PROVIDER_SITE_OTHER): Payer: Medicare Other | Admitting: Ophthalmology

## 2012-06-09 DIAGNOSIS — H33309 Unspecified retinal break, unspecified eye: Secondary | ICD-10-CM

## 2012-07-10 DIAGNOSIS — Z79899 Other long term (current) drug therapy: Secondary | ICD-10-CM | POA: Diagnosis not present

## 2012-07-11 DIAGNOSIS — Z85828 Personal history of other malignant neoplasm of skin: Secondary | ICD-10-CM | POA: Diagnosis not present

## 2012-07-11 DIAGNOSIS — L821 Other seborrheic keratosis: Secondary | ICD-10-CM | POA: Diagnosis not present

## 2012-07-11 DIAGNOSIS — D239 Other benign neoplasm of skin, unspecified: Secondary | ICD-10-CM | POA: Diagnosis not present

## 2012-07-11 DIAGNOSIS — L570 Actinic keratosis: Secondary | ICD-10-CM | POA: Diagnosis not present

## 2012-07-21 DIAGNOSIS — I4891 Unspecified atrial fibrillation: Secondary | ICD-10-CM | POA: Diagnosis not present

## 2012-07-21 DIAGNOSIS — Z7901 Long term (current) use of anticoagulants: Secondary | ICD-10-CM | POA: Diagnosis not present

## 2012-08-01 DIAGNOSIS — R040 Epistaxis: Secondary | ICD-10-CM | POA: Diagnosis not present

## 2012-08-01 DIAGNOSIS — Z7901 Long term (current) use of anticoagulants: Secondary | ICD-10-CM | POA: Diagnosis not present

## 2012-08-14 DIAGNOSIS — S0083XA Contusion of other part of head, initial encounter: Secondary | ICD-10-CM | POA: Diagnosis not present

## 2012-08-14 DIAGNOSIS — S0003XA Contusion of scalp, initial encounter: Secondary | ICD-10-CM | POA: Diagnosis not present

## 2012-08-17 DIAGNOSIS — I4891 Unspecified atrial fibrillation: Secondary | ICD-10-CM | POA: Diagnosis not present

## 2012-08-17 DIAGNOSIS — E7800 Pure hypercholesterolemia, unspecified: Secondary | ICD-10-CM | POA: Diagnosis not present

## 2012-08-17 DIAGNOSIS — Z7901 Long term (current) use of anticoagulants: Secondary | ICD-10-CM | POA: Diagnosis not present

## 2012-09-07 ENCOUNTER — Encounter (INDEPENDENT_AMBULATORY_CARE_PROVIDER_SITE_OTHER): Payer: Medicare Other | Admitting: Ophthalmology

## 2012-09-07 DIAGNOSIS — H33309 Unspecified retinal break, unspecified eye: Secondary | ICD-10-CM

## 2012-09-07 DIAGNOSIS — H35379 Puckering of macula, unspecified eye: Secondary | ICD-10-CM

## 2012-09-07 DIAGNOSIS — I10 Essential (primary) hypertension: Secondary | ICD-10-CM

## 2012-09-07 DIAGNOSIS — H35039 Hypertensive retinopathy, unspecified eye: Secondary | ICD-10-CM

## 2012-09-07 DIAGNOSIS — H43819 Vitreous degeneration, unspecified eye: Secondary | ICD-10-CM

## 2012-09-25 ENCOUNTER — Telehealth: Payer: Self-pay | Admitting: Hematology & Oncology

## 2012-09-25 NOTE — Telephone Encounter (Signed)
Pt aware of 4-28 appointment. Linda at Dr. Anne Fu office aware of appointment also. Pt wanted to be seen sooner and said she wasn't taking her Asprin. Dr. Myna Hidalgo is aware pt wants to be sooner. Linda at referring said she will let Dr. Anne Fu Nurse know about the Asprin.

## 2012-10-09 ENCOUNTER — Other Ambulatory Visit: Payer: Self-pay | Admitting: Hematology & Oncology

## 2012-10-09 ENCOUNTER — Ambulatory Visit (HOSPITAL_BASED_OUTPATIENT_CLINIC_OR_DEPARTMENT_OTHER): Payer: Medicare Other | Admitting: Lab

## 2012-10-09 DIAGNOSIS — D699 Hemorrhagic condition, unspecified: Secondary | ICD-10-CM | POA: Diagnosis not present

## 2012-10-09 DIAGNOSIS — I4891 Unspecified atrial fibrillation: Secondary | ICD-10-CM

## 2012-10-09 LAB — CBC WITH DIFFERENTIAL (CANCER CENTER ONLY)
BASO%: 0.5 % (ref 0.0–2.0)
Eosinophils Absolute: 0.1 10*3/uL (ref 0.0–0.5)
HCT: 42.6 % (ref 34.8–46.6)
HGB: 14 g/dL (ref 11.6–15.9)
LYMPH%: 25.6 % (ref 14.0–48.0)
MONO%: 8.9 % (ref 0.0–13.0)
NEUT%: 63.3 % (ref 39.6–80.0)
Platelets: 256 10*3/uL (ref 145–400)
RBC: 4.54 10*6/uL (ref 3.70–5.32)
RDW: 13.5 % (ref 11.1–15.7)
WBC: 8.1 10*3/uL (ref 3.9–10.0)

## 2012-10-09 LAB — CHCC SATELLITE - SMEAR

## 2012-10-10 DIAGNOSIS — R350 Frequency of micturition: Secondary | ICD-10-CM | POA: Diagnosis not present

## 2012-10-11 LAB — VON WILLEBRAND PANEL: Ristocetin Co-factor, Plasma: 167 % (ref 42–200)

## 2012-10-11 LAB — PROTHROMBIN TIME: INR: 1.06 (ref <1.50)

## 2012-10-16 DIAGNOSIS — R609 Edema, unspecified: Secondary | ICD-10-CM | POA: Diagnosis not present

## 2012-10-16 DIAGNOSIS — E785 Hyperlipidemia, unspecified: Secondary | ICD-10-CM | POA: Diagnosis not present

## 2012-10-16 DIAGNOSIS — I10 Essential (primary) hypertension: Secondary | ICD-10-CM | POA: Diagnosis not present

## 2012-10-23 ENCOUNTER — Other Ambulatory Visit: Payer: Medicare Other | Admitting: Lab

## 2012-10-23 ENCOUNTER — Ambulatory Visit: Payer: Medicare Other

## 2012-10-23 ENCOUNTER — Ambulatory Visit (HOSPITAL_BASED_OUTPATIENT_CLINIC_OR_DEPARTMENT_OTHER): Payer: Medicare Other | Admitting: Hematology & Oncology

## 2012-10-23 VITALS — BP 145/85 | HR 78 | Temp 97.6°F | Resp 16 | Ht 66.0 in | Wt 247.0 lb

## 2012-10-23 DIAGNOSIS — I4891 Unspecified atrial fibrillation: Secondary | ICD-10-CM

## 2012-10-23 DIAGNOSIS — I482 Chronic atrial fibrillation, unspecified: Secondary | ICD-10-CM

## 2012-10-24 NOTE — Progress Notes (Signed)
This office note has been dictated.

## 2012-10-25 NOTE — Progress Notes (Signed)
CC:   Anna Genre. Little, M.D. Jake Bathe, MD  DIAGNOSIS:  Atrial fibrillation, consideration of anticoagulation.  HISTORY OF PRESENT ILLNESS:  [name] [name] is a very nice 79 year old white female.  Past medical history is quite significant.  She has had multiple surgeries.  She does have diabetes.  She has hyperlipidemia.  She has atrial fibrillation.  She is followed by Dr. Anne Fu.  She initially was on Coumadin.  This was not followed closely, from what she says, and her INR went up to 8 and she had a lot of bleeding.  She was then transitioned over to Xarelto.  There were issues with this.  She had a fall and hit her head.  She had a large hematoma about the eyes.  She does not want to be on anticoagulation.  She is worried that she may have a "bleeding problem."  The patient says that she when she was younger, she had bleeding with wisdom tooth extractions.  She had bleeding with tonsils taken out.  She also had bleeding with neck surgery.  She has never had no bleeding elsewhere.  It seems like she just has bleeding issues whenever she is operated on or has sustained trauma from the neck up.  She was kindly referred to the [facility] to see if there is any bleeding tendency and to try to decide how to best anticoagulate her with the atrial fibrillation.  Despite her medical issues, she is really fairly functional.  She does get around, I think, with a walker because of arthritic issues and surgical issues that she is trying to recover from.  As far she knows, there are no bleeding problems in the family.  She never had any problems with childbirth and bleeding.  She stated that when she had her monthly cycles they were never heavy.  Her daughter is with her.  Her daughter states she has had some bleeding problems, again, when she had her wisdom teeth taken out.  PAST MEDICAL HISTORY: 1. Atrial fibrillation.  2.  Hyperlipidemia.  3.  Hypertension.   4.     Diabetes.  5.  Osteoarthritis.  ALLERGIES:  None.  MEDICATIONS:  Ziac 10/6.25 one p.o. b.i.d., vitamin D 2000 units p.o. daily, Lescol XL 80 mg p.o. daily, Cozaar 100 mg p.o. daily, Ditropan 5 mg p.o. b.i.d., Zantac 75 mg p.o. daily, and aspirin 81 mg p.o. daily.  SOCIAL HISTORY:  There is no alcohol use.  FAMILY HISTORY:  Relatively noncontributory.  REVIEW OF SYSTEMS:  As stated in History of Present Illness.  No additional findings noted on a 12-system review.  PHYSICAL EXAMINATION:  General:  This is a well-developed, well- nourished white female in no obvious distress.  Vital Signs: Temperature of 97.6, pulse 70, respiratory rate 16, blood pressure 145/85.  Weight is 247.  Head and Neck:  Normocephalic, atraumatic skull.  There are no ocular or oral lesions.  Lymph Nodes:  There are no palpable cervical or supraclavicular lymph nodes.  Lungs:  Clear bilaterally.  Cardiac:  Irregular rate and rhythm consistent with atrial fibrillation.  She has no murmurs, rubs, or bruits.  Abdomen:  Soft. She has good bowel sounds.  There is no fluid wave.  There is no palpable abdominal mass.  There is no palpable hepatosplenomegaly. Extremities:  Surgical scars from her multiple surgeries.  She has some stiffness in her joints.  There is some slight weakness again which is chronic.  Neurological:  No focal neurological deficits.  LABORATORY STUDIES:  White cell count of 8.1, hemoglobin 14, hematocrit 43, platelet count 256.  Her factor VIII level is 127.  Her von Willebrand antigen is 217.  Her INR is 1.06.  Her platelet function studies did not show any platelet abnormalities or platelet dysfunction.  IMPRESSION:  [name] [name] is a very nice 79 year old white female.  She has atrial fibrillation.  I cannot find any coagulopathy with her.  We checked the most common coagulation disorders and everything has come back normal with her.  Personally, I believe that she could go back  on the Coumadin.  As long as Coumadin is followed closely, I think that this should be adequate for her.  She does not want take Xarelto.  I would not recommend any of the other new oral anticoagulants (i.e. Pradaxa or Eliquis).  Again, I think Coumadin would be appropriate for her.  She really is not on a lot of medications that I think would interact with Coumadin.  I think that keeping her INR about 2 would be appropriate and reasonable for her.  It was nice talking to Ms. Bastien.  She is quite eloquent.  She certainly has had her share of medical issues, but again still seems to be quite functional.  So, I think that the benefits of Coumadin far outweigh the risk.  I think if she did have a CVA, that probably would be incredibly detrimental to her quality of life.  I do not think we have to get Ms. Lalli back here again.  If there are any issues that we do need to attend to, I will be more than happy to see her.    ______________________________ Josph Macho, M.D. PRE/MEDQ  D:  10/24/2012  T:  10/25/2012  Job:  [id number]

## 2012-11-17 DIAGNOSIS — I4891 Unspecified atrial fibrillation: Secondary | ICD-10-CM | POA: Diagnosis not present

## 2012-11-17 DIAGNOSIS — I10 Essential (primary) hypertension: Secondary | ICD-10-CM | POA: Diagnosis not present

## 2012-11-17 DIAGNOSIS — R609 Edema, unspecified: Secondary | ICD-10-CM | POA: Diagnosis not present

## 2012-11-24 DIAGNOSIS — I10 Essential (primary) hypertension: Secondary | ICD-10-CM | POA: Diagnosis not present

## 2012-11-24 DIAGNOSIS — E785 Hyperlipidemia, unspecified: Secondary | ICD-10-CM | POA: Diagnosis not present

## 2012-11-24 DIAGNOSIS — R609 Edema, unspecified: Secondary | ICD-10-CM | POA: Diagnosis not present

## 2012-11-24 DIAGNOSIS — E1129 Type 2 diabetes mellitus with other diabetic kidney complication: Secondary | ICD-10-CM | POA: Diagnosis not present

## 2012-11-24 DIAGNOSIS — Z79899 Other long term (current) drug therapy: Secondary | ICD-10-CM | POA: Diagnosis not present

## 2012-11-24 DIAGNOSIS — Z23 Encounter for immunization: Secondary | ICD-10-CM | POA: Diagnosis not present

## 2012-11-24 DIAGNOSIS — I4891 Unspecified atrial fibrillation: Secondary | ICD-10-CM | POA: Diagnosis not present

## 2012-11-29 DIAGNOSIS — Z7901 Long term (current) use of anticoagulants: Secondary | ICD-10-CM | POA: Diagnosis not present

## 2012-12-06 ENCOUNTER — Encounter: Payer: Self-pay | Admitting: Internal Medicine

## 2012-12-07 DIAGNOSIS — Z7901 Long term (current) use of anticoagulants: Secondary | ICD-10-CM | POA: Diagnosis not present

## 2012-12-14 DIAGNOSIS — Z7901 Long term (current) use of anticoagulants: Secondary | ICD-10-CM | POA: Diagnosis not present

## 2012-12-20 DIAGNOSIS — E1129 Type 2 diabetes mellitus with other diabetic kidney complication: Secondary | ICD-10-CM | POA: Diagnosis not present

## 2012-12-20 DIAGNOSIS — I10 Essential (primary) hypertension: Secondary | ICD-10-CM | POA: Diagnosis not present

## 2012-12-20 DIAGNOSIS — R609 Edema, unspecified: Secondary | ICD-10-CM | POA: Diagnosis not present

## 2012-12-20 DIAGNOSIS — E785 Hyperlipidemia, unspecified: Secondary | ICD-10-CM | POA: Diagnosis not present

## 2012-12-20 DIAGNOSIS — Z7901 Long term (current) use of anticoagulants: Secondary | ICD-10-CM | POA: Diagnosis not present

## 2012-12-20 DIAGNOSIS — Z79899 Other long term (current) drug therapy: Secondary | ICD-10-CM | POA: Diagnosis not present

## 2012-12-25 ENCOUNTER — Encounter: Payer: Self-pay | Admitting: *Deleted

## 2013-01-03 DIAGNOSIS — Z7901 Long term (current) use of anticoagulants: Secondary | ICD-10-CM | POA: Diagnosis not present

## 2013-01-10 DIAGNOSIS — Z7901 Long term (current) use of anticoagulants: Secondary | ICD-10-CM | POA: Diagnosis not present

## 2013-01-11 DIAGNOSIS — H43819 Vitreous degeneration, unspecified eye: Secondary | ICD-10-CM | POA: Diagnosis not present

## 2013-01-11 DIAGNOSIS — H5581 Saccadic eye movements: Secondary | ICD-10-CM | POA: Diagnosis not present

## 2013-01-11 DIAGNOSIS — H43829 Vitreomacular adhesion, unspecified eye: Secondary | ICD-10-CM | POA: Diagnosis not present

## 2013-01-11 DIAGNOSIS — H35379 Puckering of macula, unspecified eye: Secondary | ICD-10-CM | POA: Diagnosis not present

## 2013-01-11 DIAGNOSIS — H40019 Open angle with borderline findings, low risk, unspecified eye: Secondary | ICD-10-CM | POA: Diagnosis not present

## 2013-01-11 DIAGNOSIS — Z961 Presence of intraocular lens: Secondary | ICD-10-CM | POA: Diagnosis not present

## 2013-01-23 DIAGNOSIS — Z7901 Long term (current) use of anticoagulants: Secondary | ICD-10-CM | POA: Diagnosis not present

## 2013-02-06 DIAGNOSIS — M25559 Pain in unspecified hip: Secondary | ICD-10-CM | POA: Diagnosis not present

## 2013-02-06 DIAGNOSIS — M1710 Unilateral primary osteoarthritis, unspecified knee: Secondary | ICD-10-CM | POA: Diagnosis not present

## 2013-02-12 ENCOUNTER — Encounter (INDEPENDENT_AMBULATORY_CARE_PROVIDER_SITE_OTHER): Payer: Medicare Other | Admitting: Ophthalmology

## 2013-02-12 DIAGNOSIS — H35039 Hypertensive retinopathy, unspecified eye: Secondary | ICD-10-CM | POA: Diagnosis not present

## 2013-02-12 DIAGNOSIS — H35359 Cystoid macular degeneration, unspecified eye: Secondary | ICD-10-CM

## 2013-02-12 DIAGNOSIS — I10 Essential (primary) hypertension: Secondary | ICD-10-CM | POA: Diagnosis not present

## 2013-02-12 DIAGNOSIS — H35379 Puckering of macula, unspecified eye: Secondary | ICD-10-CM | POA: Diagnosis not present

## 2013-02-12 DIAGNOSIS — H43819 Vitreous degeneration, unspecified eye: Secondary | ICD-10-CM

## 2013-02-15 DIAGNOSIS — I10 Essential (primary) hypertension: Secondary | ICD-10-CM | POA: Diagnosis not present

## 2013-02-15 DIAGNOSIS — E1129 Type 2 diabetes mellitus with other diabetic kidney complication: Secondary | ICD-10-CM | POA: Diagnosis not present

## 2013-02-15 DIAGNOSIS — E7800 Pure hypercholesterolemia, unspecified: Secondary | ICD-10-CM | POA: Diagnosis not present

## 2013-02-15 DIAGNOSIS — Z7901 Long term (current) use of anticoagulants: Secondary | ICD-10-CM | POA: Diagnosis not present

## 2013-02-15 DIAGNOSIS — Z79899 Other long term (current) drug therapy: Secondary | ICD-10-CM | POA: Diagnosis not present

## 2013-02-15 DIAGNOSIS — E785 Hyperlipidemia, unspecified: Secondary | ICD-10-CM | POA: Diagnosis not present

## 2013-02-15 DIAGNOSIS — R609 Edema, unspecified: Secondary | ICD-10-CM | POA: Diagnosis not present

## 2013-02-20 ENCOUNTER — Encounter: Payer: Self-pay | Admitting: Internal Medicine

## 2013-02-20 ENCOUNTER — Ambulatory Visit (INDEPENDENT_AMBULATORY_CARE_PROVIDER_SITE_OTHER): Payer: Medicare Other | Admitting: Internal Medicine

## 2013-02-20 VITALS — BP 118/80 | HR 76 | Ht 67.0 in | Wt 246.2 lb

## 2013-02-20 DIAGNOSIS — B079 Viral wart, unspecified: Secondary | ICD-10-CM | POA: Diagnosis not present

## 2013-02-20 DIAGNOSIS — Z85828 Personal history of other malignant neoplasm of skin: Secondary | ICD-10-CM | POA: Diagnosis not present

## 2013-02-20 DIAGNOSIS — Z800 Family history of malignant neoplasm of digestive organs: Secondary | ICD-10-CM | POA: Diagnosis not present

## 2013-02-20 DIAGNOSIS — D485 Neoplasm of uncertain behavior of skin: Secondary | ICD-10-CM | POA: Diagnosis not present

## 2013-02-20 DIAGNOSIS — D239 Other benign neoplasm of skin, unspecified: Secondary | ICD-10-CM | POA: Diagnosis not present

## 2013-02-20 DIAGNOSIS — D689 Coagulation defect, unspecified: Secondary | ICD-10-CM

## 2013-02-20 DIAGNOSIS — L821 Other seborrheic keratosis: Secondary | ICD-10-CM | POA: Diagnosis not present

## 2013-02-20 DIAGNOSIS — L570 Actinic keratosis: Secondary | ICD-10-CM | POA: Diagnosis not present

## 2013-02-20 NOTE — Patient Instructions (Addendum)
CC: Dr Catha Gosselin

## 2013-02-20 NOTE — Progress Notes (Signed)
Jossalyn Forgione Oct 27, 1933 MRN [id number]   History of Present Illness:  This is a 79 year old white female who is here to discuss a recall colonoscopy. She has a history of colon cancer in her father at the age of 72 and in her sister. She had her last colonoscopy in September 2004 without findings of any polyps. She has a history of a tubular adenoma in 1991. She had a hyperplastic polyp in 1994 and 2 hyperplastic polyps in 1996. She also had 2 hyperplastic polyps in 1988. Her last 2 colonoscopies in 1999 and 2004 were without polyps. She denies any specific GI symptoms. She has a history of atrial fibrillation requiring chronic Coumadin. She was temporarily on Xarelta, but is back on Coumadin. Because of this, she is very hesitant to schedule a colonoscopy., because she would have to discontinue the Coumadin.She has occasional diarrhea related to lactose intolerance.     Past Medical History  Diagnosis Date  . Atrial fibrillation   . Hypertension   . Diabetes   . Hyperlipidemia   . Osteoarthritis    Past Surgical History  Procedure Laterality Date  . Wisdom tooth extraction    . Tonsillectomy    . Neck surgery    . Back surgery    . Knee surgery    . Total hip arthroplasty    . Cataract extraction  Apr 11, 2012, Jun 04 2012  . Carpal tunnel release Right     reports that she has never smoked. She has never used smokeless tobacco. She reports that  drinks alcohol. She reports that she does not use illicit drugs. family history includes Colon cancer in her father; Heart disease in her mother. No Known Allergies      Review of Systems:Denies abdominal pain rectal bleeding diarrhea constipation  The remainder of the 10 point ROS is negative except as outlined in H&P   Physical Exam: General appearance  Well developed, in no distress. Eyes- non icteric. HEENT nontraumatic, normocephalic. Mouth no lesions, tongue papillated, no cheilosis. Neck supple without adenopathy, thyroid  not enlarged, no carotid bruits, no JVD. Lungs Clear to auscultation bilaterally. Cor normal S1, normal S2, regular rhythm, no murmur,  quiet precordium. Abdomen: Obese soft with normoactive bowel sounds. No tenderness. No fullness.  Rectal:Soft Hemoccult negative stool.  Extremities no pedal edema. Skin no lesions. Neurological alert and oriented x 3. Psychological normal mood and affect.  Assessment and Plan:  Problem #16 79 year old white female with significant cardiovascular problems who is currently on lifelong  Coumadin. She has a family history of colon cancer in two direct relatives and a personal history of adenomatous polyps in 1991, but no pllyps on the last 2 colonoscopies. She is Hemoccult negative on my exam and has no specific symptoms. She is lactose intolerant. We have discussed risks and the benefits of colonoscopy. She prefers not to have a screening colonoscopy which is okay with me. Potential risks may be exceeding the benefits.. We will not plan a recall colonoscopy unless she develops specific symptoms.  02/20/2013 Lina Sar

## 2013-03-02 DIAGNOSIS — Z7901 Long term (current) use of anticoagulants: Secondary | ICD-10-CM | POA: Diagnosis not present

## 2013-03-20 ENCOUNTER — Encounter (INDEPENDENT_AMBULATORY_CARE_PROVIDER_SITE_OTHER): Payer: Medicare Other | Admitting: Ophthalmology

## 2013-03-20 DIAGNOSIS — H43819 Vitreous degeneration, unspecified eye: Secondary | ICD-10-CM

## 2013-03-20 DIAGNOSIS — H35379 Puckering of macula, unspecified eye: Secondary | ICD-10-CM | POA: Diagnosis not present

## 2013-03-20 DIAGNOSIS — H35359 Cystoid macular degeneration, unspecified eye: Secondary | ICD-10-CM | POA: Diagnosis not present

## 2013-03-20 DIAGNOSIS — H35039 Hypertensive retinopathy, unspecified eye: Secondary | ICD-10-CM

## 2013-03-20 DIAGNOSIS — I10 Essential (primary) hypertension: Secondary | ICD-10-CM

## 2013-03-26 ENCOUNTER — Encounter (INDEPENDENT_AMBULATORY_CARE_PROVIDER_SITE_OTHER): Payer: Medicare Other | Admitting: Ophthalmology

## 2013-03-28 DIAGNOSIS — Z7901 Long term (current) use of anticoagulants: Secondary | ICD-10-CM | POA: Diagnosis not present

## 2013-04-10 ENCOUNTER — Emergency Department (HOSPITAL_COMMUNITY): Payer: Medicare Other

## 2013-04-10 ENCOUNTER — Encounter (HOSPITAL_COMMUNITY): Payer: Self-pay | Admitting: Emergency Medicine

## 2013-04-10 ENCOUNTER — Emergency Department (HOSPITAL_COMMUNITY)
Admission: EM | Admit: 2013-04-10 | Discharge: 2013-04-10 | Disposition: A | Discharge status: Home / Self Care | Payer: Medicare Other | Attending: Emergency Medicine | Admitting: Emergency Medicine

## 2013-04-10 DIAGNOSIS — Z79899 Other long term (current) drug therapy: Secondary | ICD-10-CM | POA: Insufficient documentation

## 2013-04-10 DIAGNOSIS — E119 Type 2 diabetes mellitus without complications: Secondary | ICD-10-CM | POA: Diagnosis not present

## 2013-04-10 DIAGNOSIS — Z7901 Long term (current) use of anticoagulants: Secondary | ICD-10-CM | POA: Diagnosis not present

## 2013-04-10 DIAGNOSIS — J209 Acute bronchitis, unspecified: Secondary | ICD-10-CM | POA: Diagnosis not present

## 2013-04-10 DIAGNOSIS — E785 Hyperlipidemia, unspecified: Secondary | ICD-10-CM | POA: Insufficient documentation

## 2013-04-10 DIAGNOSIS — S93409A Sprain of unspecified ligament of unspecified ankle, initial encounter: Secondary | ICD-10-CM | POA: Insufficient documentation

## 2013-04-10 DIAGNOSIS — I10 Essential (primary) hypertension: Secondary | ICD-10-CM | POA: Insufficient documentation

## 2013-04-10 DIAGNOSIS — Z791 Long term (current) use of non-steroidal anti-inflammatories (NSAID): Secondary | ICD-10-CM | POA: Insufficient documentation

## 2013-04-10 DIAGNOSIS — R6889 Other general symptoms and signs: Secondary | ICD-10-CM | POA: Diagnosis not present

## 2013-04-10 DIAGNOSIS — W010XXA Fall on same level from slipping, tripping and stumbling without subsequent striking against object, initial encounter: Secondary | ICD-10-CM | POA: Insufficient documentation

## 2013-04-10 DIAGNOSIS — J40 Bronchitis, not specified as acute or chronic: Secondary | ICD-10-CM | POA: Diagnosis not present

## 2013-04-10 DIAGNOSIS — Y929 Unspecified place or not applicable: Secondary | ICD-10-CM | POA: Insufficient documentation

## 2013-04-10 DIAGNOSIS — S93402A Sprain of unspecified ligament of left ankle, initial encounter: Secondary | ICD-10-CM

## 2013-04-10 DIAGNOSIS — M25579 Pain in unspecified ankle and joints of unspecified foot: Secondary | ICD-10-CM | POA: Diagnosis not present

## 2013-04-10 DIAGNOSIS — M25569 Pain in unspecified knee: Secondary | ICD-10-CM | POA: Diagnosis not present

## 2013-04-10 DIAGNOSIS — M79609 Pain in unspecified limb: Secondary | ICD-10-CM | POA: Diagnosis not present

## 2013-04-10 DIAGNOSIS — S8990XA Unspecified injury of unspecified lower leg, initial encounter: Secondary | ICD-10-CM | POA: Diagnosis not present

## 2013-04-10 DIAGNOSIS — M1990 Unspecified osteoarthritis, unspecified site: Secondary | ICD-10-CM | POA: Diagnosis not present

## 2013-04-10 DIAGNOSIS — Y9389 Activity, other specified: Secondary | ICD-10-CM | POA: Insufficient documentation

## 2013-04-10 MED ORDER — AZITHROMYCIN 250 MG PO TABS: ORAL_TABLET | ORAL | Status: DC

## 2013-04-10 NOTE — ED Notes (Signed)
Bed: ZO10 Expected date:  Expected time:  Means of arrival:  Comments: 79 y/o fall ankle pain

## 2013-04-10 NOTE — ED Provider Notes (Signed)
CSN: [id number]     Arrival date & time 04/10/13  4098 History   First MD Initiated Contact with Patient 04/10/13 [phone]     Chief Complaint  Patient presents with  . Ankle Injury   (Consider location/radiation/quality/duration/timing/severity/associated sxs/prior Treatment) HPI.... status post left ankle injury after accidental trip on onion skin approximately one week ago.  Patient complains of proximal left tibia, lateral left foot, and both medially and laterally and ankle.  No head or neck trauma. No other injuries. Severity is mild to moderate. She is able to walk with a walker  Past Medical History  Diagnosis Date  . Atrial fibrillation   . Hypertension   . Diabetes   . Hyperlipidemia   . Osteoarthritis    Past Surgical History  Procedure Laterality Date  . Wisdom tooth extraction    . Tonsillectomy    . Neck surgery    . Back surgery    . Knee surgery    . Total hip arthroplasty    . Cataract extraction  Apr 11, 2012, Jun 04 2012  . Carpal tunnel release Right    Family History  Problem Relation Age of Onset  . Heart disease Mother   . Colon cancer Father    History  Substance Use Topics  . Smoking status: Never Smoker   . Smokeless tobacco: Never Used  . Alcohol Use: Yes     Comment: social   OB History   Grav Para Term Preterm Abortions TAB SAB Ect Mult Living                 Review of Systems  All other systems reviewed and are negative.    Allergies  Review of patient's allergies indicates no known allergies.  Home Medications   Current Outpatient Rx  Name  Route  Sig  Dispense  Refill  . bisoprolol-hydrochlorothiazide (ZIAC) 10-6.25 MG per tablet   Oral   Take 1 tablet by mouth 2 (two) times daily.          . Calcium Carbonate-Vitamin D (CALTRATE 600+D PO)   Oral   Take 1 tablet by mouth every Monday, Wednesday, and Friday.         . Cholecalciferol (VITAMIN D) 2000 UNITS tablet   Oral   Take 2,000 Units by mouth daily.         .  Diclofenac Potassium (CATAFLAM PO)   Oral   Take 25 mg by mouth daily as needed (pain). 4-5 times weekly         . HYDROcodone-acetaminophen (NORCO/VICODIN) 5-325 MG per tablet   Oral   Take 1 tablet by mouth every 6 (six) hours as needed for pain.         . hydrocortisone cream 1 %   Topical   Apply 1 application topically as needed. For itching under breasts         . losartan (COZAAR) 100 MG tablet   Oral   Take 100 mg by mouth daily.         . Multiple Vitamins-Minerals (CENTRUM SILVER PO)   Oral   Take by mouth 3 (three) times a week.         . Omega-3 Fatty Acids (FISH OIL) 1200 MG CAPS   Oral   Take by mouth every morning.         Marland Kitchen oxybutynin (DITROPAN) 5 MG tablet   Oral   Take 5 mg by mouth 2 (two) times daily.         Marland Kitchen  ranitidine (ZANTAC) 75 MG tablet   Oral   Take 75 mg by mouth every evening.         . rosuvastatin (CRESTOR) 10 MG tablet   Oral   Take 10 mg by mouth. m w f at bedtime         . warfarin (COUMADIN) 3 MG tablet   Oral   Take 3 mg by mouth daily.         . Nepafenac (ILEVRO) 0.3 % SUSP                BP 140/84  Pulse 118  Temp(Src) 99.3 F (37.4 C) (Oral)  Resp 16  SpO2 95% Physical Exam  Nursing note and vitals reviewed. Constitutional: She is oriented to person, place, and time. She appears well-developed and well-nourished.  HENT:  Head: Normocephalic and atraumatic.  Eyes: Conjunctivae and EOM are normal. Pupils are equal, round, and reactive to light.  Neck: Normal range of motion. Neck supple.  Cardiovascular: Normal rate, regular rhythm and normal heart sounds.   Pulmonary/Chest: Effort normal and breath sounds normal.  Abdominal: Soft. Bowel sounds are normal.  Musculoskeletal:  Left lower extremity: Most tender on the proximal tibia, lateral malleolus, and lateral aspect of the foot.  You begin ecchymosis throughout left lower extremity from knee distally  Neurological: She is alert and oriented  to person, place, and time.  Skin: Skin is warm and dry.  Psychiatric: She has a normal mood and affect.    ED Course  Procedures (including critical care time) Labs Review Labs Reviewed - No data to display Imaging Review Dg Tibia/fibula Left  04/10/2013   CLINICAL DATA:  Larey Seat several days ago, now with pain intrusion  EXAM: LEFT TIBIA AND FIBULA - 2 VIEW  COMPARISON:  None.  FINDINGS: The left tibia and fibula are in normal alignment. No acute fracture is seen. Left total knee replacement is present. There is soft tissue swelling overlying the lateral aspect of the left ankle.  IMPRESSION: No acute fracture.   Electronically Signed   By: Dwyane Dee M.D.   On: 04/10/2013 09:40   Dg Ankle Complete Left  04/10/2013   *RADIOLOGY REPORT*  Clinical Data: Fall, pain and swelling  LEFT ANKLE COMPLETE - 3+ VIEW  Comparison: None.  Findings: Diffuse soft tissue swelling more pronounced laterally. Degenerative changes of the left ankle joint diffusely as well as the midfoot.  No definite displaced fracture or malalignment. Calcaneal spurring evident.  IMPRESSION: Lateral soft tissue swelling  Diffuse degenerative changes without acute displaced fracture   Original Report Authenticated By: Judie Petit. Shick, M.D.   Dg Foot Complete Left  04/10/2013   CLINICAL DATA:  Larey Seat several days ago, now with bruising and swelling  EXAM: LEFT FOOT - COMPLETE 3+ VIEW  COMPARISON:  None.  FINDINGS: Tarsal-metatarsal alignment is normal. There is degenerative change in the midfoot and degenerative calcaneal spurs are noted. There is degenerative change at the left 1st MTP joint with some loss of joint space and sclerosis with spurring. However, no acute fracture is seen. Soft tissue prominence is noted over the dorsum of the foot.  IMPRESSION: Diffuse degenerative change. No acute fracture.   Electronically Signed   By: Dwyane Dee M.D.   On: 04/10/2013 09:41    EKG Interpretation   None       MDM   1. Left ankle  sprain, initial encounter    X-rays of left tibia, left ankle, left foot all negative  for fracture.   Nurse will apply ankle brace.  Discussed with patient.    Donnetta Hutching, MD 04/10/13 [phone]

## 2013-04-10 NOTE — ED Notes (Signed)
Upon taking the Pt to the lobby, the Pt's daughter sts "I have no way of getting her into the house and she can not walk."  [facility] has been called and they will pick Pt up in the lobby.

## 2013-04-10 NOTE — ED Notes (Signed)
Patient transported to X-ray 

## 2013-04-10 NOTE — ED Notes (Signed)
MD at bedside. 

## 2013-04-10 NOTE — ED Notes (Signed)
Pt's son's number is [phone]

## 2013-04-10 NOTE — ED Notes (Addendum)
Per [facility]  Pt from home c/o fall on 04/03/13 with left ankle injury. Swelling and bruising noted. Pt on warfarin in which she has not taken since fall. Pain 5 out of 10. Increases to 8 out of 10 w/ weight bearing. Pt reports taking hydrocodone this a.m.  Pt A&O. VSS, NAD noted. Pt also c/o cough and nasal congestion. Pt reports family has been sick with same.

## 2013-04-10 NOTE — ED Notes (Signed)
Ortho tech Franky Macho called for [facility]

## 2013-04-12 DIAGNOSIS — R82998 Other abnormal findings in urine: Secondary | ICD-10-CM | POA: Diagnosis not present

## 2013-04-12 DIAGNOSIS — Z7901 Long term (current) use of anticoagulants: Secondary | ICD-10-CM | POA: Diagnosis not present

## 2013-04-12 DIAGNOSIS — J45909 Unspecified asthma, uncomplicated: Secondary | ICD-10-CM | POA: Diagnosis not present

## 2013-04-16 DIAGNOSIS — J40 Bronchitis, not specified as acute or chronic: Secondary | ICD-10-CM | POA: Diagnosis not present

## 2013-04-16 DIAGNOSIS — E1129 Type 2 diabetes mellitus with other diabetic kidney complication: Secondary | ICD-10-CM | POA: Diagnosis not present

## 2013-04-16 DIAGNOSIS — N390 Urinary tract infection, site not specified: Secondary | ICD-10-CM | POA: Diagnosis not present

## 2013-04-16 DIAGNOSIS — Z7901 Long term (current) use of anticoagulants: Secondary | ICD-10-CM | POA: Diagnosis not present

## 2013-04-20 DIAGNOSIS — Z7901 Long term (current) use of anticoagulants: Secondary | ICD-10-CM | POA: Diagnosis not present

## 2013-04-20 DIAGNOSIS — I4891 Unspecified atrial fibrillation: Secondary | ICD-10-CM | POA: Diagnosis not present

## 2013-04-27 DIAGNOSIS — I4891 Unspecified atrial fibrillation: Secondary | ICD-10-CM | POA: Diagnosis not present

## 2013-04-27 DIAGNOSIS — Z7901 Long term (current) use of anticoagulants: Secondary | ICD-10-CM | POA: Diagnosis not present

## 2013-05-17 ENCOUNTER — Ambulatory Visit: Payer: Medicare Other | Admitting: Cardiology

## 2013-05-21 DIAGNOSIS — I4891 Unspecified atrial fibrillation: Secondary | ICD-10-CM | POA: Diagnosis not present

## 2013-05-21 DIAGNOSIS — Z7901 Long term (current) use of anticoagulants: Secondary | ICD-10-CM | POA: Diagnosis not present

## 2013-06-22 DIAGNOSIS — I4891 Unspecified atrial fibrillation: Secondary | ICD-10-CM | POA: Diagnosis not present

## 2013-06-22 DIAGNOSIS — Z7901 Long term (current) use of anticoagulants: Secondary | ICD-10-CM | POA: Diagnosis not present

## 2013-07-06 DIAGNOSIS — I4891 Unspecified atrial fibrillation: Secondary | ICD-10-CM | POA: Diagnosis not present

## 2013-07-06 DIAGNOSIS — R350 Frequency of micturition: Secondary | ICD-10-CM | POA: Diagnosis not present

## 2013-07-06 DIAGNOSIS — Z7901 Long term (current) use of anticoagulants: Secondary | ICD-10-CM | POA: Diagnosis not present

## 2013-07-25 DIAGNOSIS — I4891 Unspecified atrial fibrillation: Secondary | ICD-10-CM | POA: Diagnosis not present

## 2013-07-25 DIAGNOSIS — Z7901 Long term (current) use of anticoagulants: Secondary | ICD-10-CM | POA: Diagnosis not present

## 2013-07-25 DIAGNOSIS — R32 Unspecified urinary incontinence: Secondary | ICD-10-CM | POA: Diagnosis not present

## 2013-07-26 DIAGNOSIS — R32 Unspecified urinary incontinence: Secondary | ICD-10-CM | POA: Diagnosis not present

## 2013-08-10 DIAGNOSIS — I4891 Unspecified atrial fibrillation: Secondary | ICD-10-CM | POA: Diagnosis not present

## 2013-08-10 DIAGNOSIS — Z7901 Long term (current) use of anticoagulants: Secondary | ICD-10-CM | POA: Diagnosis not present

## 2013-09-05 DIAGNOSIS — D0470 Carcinoma in situ of skin of unspecified lower limb, including hip: Secondary | ICD-10-CM | POA: Diagnosis not present

## 2013-09-05 DIAGNOSIS — Z85828 Personal history of other malignant neoplasm of skin: Secondary | ICD-10-CM | POA: Diagnosis not present

## 2013-09-05 DIAGNOSIS — L408 Other psoriasis: Secondary | ICD-10-CM | POA: Diagnosis not present

## 2013-09-05 DIAGNOSIS — D485 Neoplasm of uncertain behavior of skin: Secondary | ICD-10-CM | POA: Diagnosis not present

## 2013-09-05 DIAGNOSIS — D239 Other benign neoplasm of skin, unspecified: Secondary | ICD-10-CM | POA: Diagnosis not present

## 2013-09-05 DIAGNOSIS — L570 Actinic keratosis: Secondary | ICD-10-CM | POA: Diagnosis not present

## 2013-09-14 DIAGNOSIS — Z7901 Long term (current) use of anticoagulants: Secondary | ICD-10-CM | POA: Diagnosis not present

## 2013-09-14 DIAGNOSIS — R32 Unspecified urinary incontinence: Secondary | ICD-10-CM | POA: Diagnosis not present

## 2013-09-14 DIAGNOSIS — I4891 Unspecified atrial fibrillation: Secondary | ICD-10-CM | POA: Diagnosis not present

## 2013-09-17 ENCOUNTER — Ambulatory Visit (INDEPENDENT_AMBULATORY_CARE_PROVIDER_SITE_OTHER): Payer: Medicare Other | Admitting: Ophthalmology

## 2013-10-17 DIAGNOSIS — D485 Neoplasm of uncertain behavior of skin: Secondary | ICD-10-CM | POA: Diagnosis not present

## 2013-10-24 DIAGNOSIS — I10 Essential (primary) hypertension: Secondary | ICD-10-CM | POA: Diagnosis not present

## 2013-10-24 DIAGNOSIS — Z23 Encounter for immunization: Secondary | ICD-10-CM | POA: Diagnosis not present

## 2013-10-24 DIAGNOSIS — E7800 Pure hypercholesterolemia, unspecified: Secondary | ICD-10-CM | POA: Diagnosis not present

## 2013-10-24 DIAGNOSIS — I4891 Unspecified atrial fibrillation: Secondary | ICD-10-CM | POA: Diagnosis not present

## 2013-10-24 DIAGNOSIS — E1129 Type 2 diabetes mellitus with other diabetic kidney complication: Secondary | ICD-10-CM | POA: Diagnosis not present

## 2013-10-24 DIAGNOSIS — N183 Chronic kidney disease, stage 3 unspecified: Secondary | ICD-10-CM | POA: Diagnosis not present

## 2013-10-25 DIAGNOSIS — E7800 Pure hypercholesterolemia, unspecified: Secondary | ICD-10-CM | POA: Diagnosis not present

## 2013-10-25 DIAGNOSIS — E1129 Type 2 diabetes mellitus with other diabetic kidney complication: Secondary | ICD-10-CM | POA: Diagnosis not present

## 2013-10-25 DIAGNOSIS — I4891 Unspecified atrial fibrillation: Secondary | ICD-10-CM | POA: Diagnosis not present

## 2013-10-25 DIAGNOSIS — I10 Essential (primary) hypertension: Secondary | ICD-10-CM | POA: Diagnosis not present

## 2013-10-25 DIAGNOSIS — N183 Chronic kidney disease, stage 3 unspecified: Secondary | ICD-10-CM | POA: Diagnosis not present

## 2013-12-21 DIAGNOSIS — Z7901 Long term (current) use of anticoagulants: Secondary | ICD-10-CM | POA: Diagnosis not present

## 2013-12-21 DIAGNOSIS — I4891 Unspecified atrial fibrillation: Secondary | ICD-10-CM | POA: Diagnosis not present

## 2014-01-03 DIAGNOSIS — Z7901 Long term (current) use of anticoagulants: Secondary | ICD-10-CM | POA: Diagnosis not present

## 2014-01-03 DIAGNOSIS — Z8679 Personal history of other diseases of the circulatory system: Secondary | ICD-10-CM | POA: Diagnosis not present

## 2014-01-25 DIAGNOSIS — Z5181 Encounter for therapeutic drug level monitoring: Secondary | ICD-10-CM | POA: Diagnosis not present

## 2014-01-25 DIAGNOSIS — Z7901 Long term (current) use of anticoagulants: Secondary | ICD-10-CM | POA: Diagnosis not present

## 2014-02-08 DIAGNOSIS — Z8679 Personal history of other diseases of the circulatory system: Secondary | ICD-10-CM | POA: Diagnosis not present

## 2014-02-15 ENCOUNTER — Encounter (HOSPITAL_COMMUNITY): Payer: Self-pay | Admitting: Emergency Medicine

## 2014-02-15 ENCOUNTER — Emergency Department (HOSPITAL_COMMUNITY)
Admission: EM | Admit: 2014-02-15 | Discharge: 2014-02-15 | Disposition: A | Discharge status: Home / Self Care | Payer: Medicare Other | Attending: Emergency Medicine | Admitting: Emergency Medicine

## 2014-02-15 DIAGNOSIS — E785 Hyperlipidemia, unspecified: Secondary | ICD-10-CM | POA: Diagnosis not present

## 2014-02-15 DIAGNOSIS — Z79899 Other long term (current) drug therapy: Secondary | ICD-10-CM | POA: Insufficient documentation

## 2014-02-15 DIAGNOSIS — J029 Acute pharyngitis, unspecified: Secondary | ICD-10-CM | POA: Diagnosis not present

## 2014-02-15 DIAGNOSIS — Z7901 Long term (current) use of anticoagulants: Secondary | ICD-10-CM | POA: Diagnosis not present

## 2014-02-15 DIAGNOSIS — J329 Chronic sinusitis, unspecified: Secondary | ICD-10-CM | POA: Diagnosis not present

## 2014-02-15 DIAGNOSIS — E119 Type 2 diabetes mellitus without complications: Secondary | ICD-10-CM | POA: Diagnosis not present

## 2014-02-15 DIAGNOSIS — M1990 Unspecified osteoarthritis, unspecified site: Secondary | ICD-10-CM | POA: Insufficient documentation

## 2014-02-15 DIAGNOSIS — I4891 Unspecified atrial fibrillation: Secondary | ICD-10-CM | POA: Diagnosis not present

## 2014-02-15 DIAGNOSIS — R040 Epistaxis: Secondary | ICD-10-CM | POA: Insufficient documentation

## 2014-02-15 DIAGNOSIS — I10 Essential (primary) hypertension: Secondary | ICD-10-CM | POA: Diagnosis not present

## 2014-02-15 LAB — CBC WITH DIFFERENTIAL/PLATELET
BASOS PCT: 0 % (ref 0–1)
Basophils Absolute: 0 10*3/uL (ref 0.0–0.1)
Eosinophils Absolute: 0.1 10*3/uL (ref 0.0–0.7)
Eosinophils Relative: 1 % (ref 0–5)
HCT: 45.6 % (ref 36.0–46.0)
HEMOGLOBIN: 14.9 g/dL (ref 12.0–15.0)
LYMPHS ABS: 1.7 10*3/uL (ref 0.7–4.0)
Lymphocytes Relative: 17 % (ref 12–46)
MCH: 30.3 pg (ref 26.0–34.0)
MCHC: 32.7 g/dL (ref 30.0–36.0)
MCV: 92.7 fL (ref 78.0–100.0)
Monocytes Absolute: 1 10*3/uL (ref 0.1–1.0)
Monocytes Relative: 10 % (ref 3–12)
NEUTROS ABS: 7 10*3/uL (ref 1.7–7.7)
NEUTROS PCT: 72 % (ref 43–77)
Platelets: 219 10*3/uL (ref 150–400)
RBC: 4.92 MIL/uL (ref 3.87–5.11)
RDW: 13.6 % (ref 11.5–15.5)
WBC: 9.8 10*3/uL (ref 4.0–10.5)

## 2014-02-15 LAB — PROTIME-INR
INR: 2.57 — AB (ref 0.00–1.49)
Prothrombin Time: 27.6 seconds — ABNORMAL HIGH (ref 11.6–15.2)

## 2014-02-15 MED ORDER — HYDROCODONE-ACETAMINOPHEN 5-325 MG PO TABS
1.0000 | ORAL_TABLET | Freq: Once | ORAL | Status: AC
  Administered 2014-02-15: 1 via ORAL
  Filled 2014-02-15: qty 1

## 2014-02-15 MED ORDER — ACETAMINOPHEN 325 MG PO TABS: 650.0000 mg | ORAL_TABLET | Freq: Once | ORAL | Status: DC

## 2014-02-15 NOTE — Discharge Instructions (Signed)
Your lab work today looks normal, INR is in proper range. If nosebleed recurs, apply pressure and/or ice to help control bleeding.  If unable to control bleeding, return to the ED. Follow-up with your primary care physician as needed  Nosebleed Nosebleeds can be caused by many conditions, including trauma, infections, polyps, foreign bodies, dry mucous membranes or climate, medicines, and air conditioning. Most nosebleeds occur in the front of the nose. Because of this location, most nosebleeds can be controlled by pinching the nostrils gently and continuously for at least 10 to 20 minutes. The long, continuous pressure allows enough time for the blood to clot. If pressure is released during that 10 to 20 minute time period, the process may have to be started again. The nosebleed may stop by itself or quit with pressure, or it may need concentrated heating (cautery) or pressure from packing. HOME CARE INSTRUCTIONS   If your nose was packed, try to maintain the pack inside until your health care provider removes it. If a gauze pack was used and it starts to fall out, gently replace it or cut the end off. Do not cut if a balloon catheter was used to pack the nose. Otherwise, do not remove unless instructed.  Avoid blowing your nose for 12 hours after treatment. This could dislodge the pack or clot and start the bleeding again.  If the bleeding starts again, sit up and bend forward, gently pinching the front half of your nose continuously for 20 minutes.  If bleeding was caused by dry mucous membranes, use over-the-counter saline nasal spray or gel. This will keep the mucous membranes moist and allow them to heal. If you must use a lubricant, choose the water-soluble variety. Use it only sparingly and not within several hours of lying down.  Do not use petroleum jelly or mineral oil, as these may drip into the lungs and cause serious problems.  Maintain humidity in your home by using less air  conditioning or by using a humidifier.  Do not use aspirin or medicines which make bleeding more likely. Your health care provider can give you recommendations on this.  Resume normal activities as you are able, but try to avoid straining, lifting, or bending at the waist for several days.  If the nosebleeds become recurrent and the cause is unknown, your health care provider may suggest laboratory tests. SEEK MEDICAL CARE IF: You have a fever. SEEK IMMEDIATE MEDICAL CARE IF:   Bleeding recurs and cannot be controlled.  There is unusual bleeding from or bruising on other parts of the body.  Nosebleeds continue.  There is any worsening of the condition which originally brought you in.  You become light-headed, feel faint, become sweaty, or vomit blood. MAKE SURE YOU:   Understand these instructions.  Will watch your condition.  Will get help right away if you are not doing well or get worse. Document Released: 03/24/2005 Document Revised: 10/29/2013 Document Reviewed: 05/15/2009 [facility] Patient Information 2015 [place], [place]. This information is not intended to replace advice given to you by your health care provider. Make sure you discuss any questions you have with your health care provider.

## 2014-02-15 NOTE — ED Notes (Signed)
Pt reports nosebleed since 1945 this evening. Pt dx with sinus infection today. Blew her nose and began having nosebleed. No Hx of nosebleed. On coumadin for A fib, PT/INR 1 week ago was 2.7. Tissue stuffed in R nare, believes bleeding is well controlled at this point.

## 2014-02-15 NOTE — ED Provider Notes (Signed)
Pt seen and evaluated.  D/w [facility], [place].  Pt with symptoms of anterior epistaxis. Tiny area of bleeding source on Rt anterior septum.  No bleeding in ER.  INR 2.57.  I agree that no cautery or packing required currently.  Pt given return precautions.  Will Pinch anterior nares for 10 min c recurrence.  ER if unresolved.  Tanna Furry, MD 02/15/14 2124

## 2014-02-15 NOTE — ED Provider Notes (Signed)
CSN: [id number]     Arrival date & time 02/15/14  1934 History   First MD Initiated Contact with Patient 02/15/14 2020     Chief Complaint  Patient presents with  . Epistaxis     (Consider location/radiation/quality/duration/timing/severity/associated sxs/prior Treatment) Patient is a 80 y.o. female presenting with nosebleeds. The history is provided by the patient and medical records.  Epistaxis  This is a 80 y.o. F with PMH significant for HTN, DM, OA, HLP, AFIB on coumadin, presenting to the ED for epistaxis.  Patient states she was seen by her PCP earlier today for nasal congestion and sinus pressure, told it was a viral infection and encouraged on symptomatic care.  States this afternoon she blew her nose around 1945 and shortly after developed a nosebleed.  States she applied pressure and could not get bleeding to stop.  She has packed her nose with tissues to control bleeding.  INR was checked 1 week ago, 2.7.  Patient states she has a headache at present but thinks it is related to her sinus infection.  Denies dizziness, lightheadedness, visual disturbance, or feelings of syncope.  VS stable on arrival.  Past Medical History  Diagnosis Date  . Atrial fibrillation   . Hypertension   . Hyperlipidemia   . Osteoarthritis   . Diabetes     prediabetes   Past Surgical History  Procedure Laterality Date  . Wisdom tooth extraction    . Tonsillectomy    . Neck surgery    . Back surgery    . Knee surgery    . Total hip arthroplasty    . Cataract extraction  Apr 11, 2012, Jun 04 2012  . Carpal tunnel release Right    Family History  Problem Relation Age of Onset  . Heart disease Mother   . Colon cancer Father    History  Substance Use Topics  . Smoking status: Never Smoker   . Smokeless tobacco: Never Used  . Alcohol Use: Yes     Comment: social   OB History   Grav Para Term Preterm Abortions TAB SAB Ect Mult Living                 Review of Systems  HENT: Positive for  nosebleeds.   All other systems reviewed and are negative.  Allergies  Review of patient's allergies indicates no known allergies.  Home Medications   Prior to Admission medications   Medication Sig Start Date End Date Taking? Authorizing Provider  azithromycin (ZITHROMAX Z-PAK) 250 MG tablet 2 po day one, then 1 daily x 4 days 04/10/13   [name], MD  bisoprolol-hydrochlorothiazide [facility]) 10-6.25 MG per tablet Take 1 tablet by mouth 2 (two) times daily.     Historical Provider, MD  Calcium Carbonate-Vitamin D (CALTRATE 600+D PO) Take 1 tablet by mouth every Monday, Wednesday, and Friday.    Historical Provider, MD  Cholecalciferol (VITAMIN D) 2000 UNITS tablet Take 2,000 Units by mouth daily.    Historical Provider, MD  Diclofenac Potassium (CATAFLAM PO) Take 25 mg by mouth daily as needed (pain). 4-5 times weekly    Historical Provider, MD  HYDROcodone-acetaminophen (NORCO/VICODIN) 5-325 MG per tablet Take 1 tablet by mouth every 6 (six) hours as needed for pain.    Historical Provider, MD  hydrocortisone cream 1 % Apply 1 application topically as needed. For itching under breasts    Historical Provider, MD  losartan (COZAAR) 100 MG tablet Take 100 mg by mouth daily.  Historical Provider, MD  Multiple Vitamins-Minerals (CENTRUM SILVER PO) Take by mouth 3 (three) times a week.    Historical Provider, MD  Nepafenac (ILEVRO) 0.3 % SUSP  02/19/13   Historical Provider, MD  Omega-3 Fatty Acids (FISH OIL) 1200 MG CAPS Take by mouth every morning.    Historical Provider, MD  oxybutynin (DITROPAN) 5 MG tablet Take 5 mg by mouth 2 (two) times daily.    Historical Provider, MD  ranitidine (ZANTAC) 75 MG tablet Take 75 mg by mouth every evening.    Historical Provider, MD  rosuvastatin (CRESTOR) 10 MG tablet Take 10 mg by mouth. m w f at bedtime    Historical Provider, MD  warfarin (COUMADIN) 3 MG tablet Take 3 mg by mouth daily.    Historical Provider, MD   BP 179/83  Pulse 81  Temp(Src) 98.9  F (37.2 C) (Oral)  Resp 16  SpO2 95%  Physical Exam  Nursing note and vitals reviewed. Constitutional: She is oriented to person, place, and time. She appears well-developed and well-nourished. No distress.  HENT:  Head: Normocephalic and atraumatic.  Nose: No sinus tenderness, nasal deformity, septal deviation or nasal septal hematoma. Epistaxis is observed.  No foreign bodies.  Mouth/Throat: Oropharynx is clear and moist.  Clot present in right nare along nasal septum, no active bleeding, no septal hematoma or deformity noted; left nare clear  Eyes: Conjunctivae and EOM are normal. Pupils are equal, round, and reactive to light.  Neck: Normal range of motion. Neck supple.  Cardiovascular: Normal rate, regular rhythm and normal heart sounds.   Pulmonary/Chest: Effort normal and breath sounds normal. No respiratory distress. She has no wheezes.  Musculoskeletal: Normal range of motion.  Neurological: She is alert and oriented to person, place, and time.  Skin: Skin is warm and dry. She is not diaphoretic.  Psychiatric: She has a normal mood and affect.    ED Course  Procedures (including critical care time) Labs Review Labs Reviewed  PROTIME-INR - Abnormal; Notable for the following:    Prothrombin Time 27.6 (*)    INR 2.57 (*)    All other components within normal limits  CBC WITH DIFFERENTIAL    Imaging Review No results found.   EKG Interpretation None      MDM   Final diagnoses:  Epistaxis   80 y.o. F anti-coagulated with coumadin, presenting to the ED for epistaxis.  Patient has tissue packed into right nare.  Tissue was removed, small site of bleeding noted along right side of septum.  No septal hematoma or deformity noted.  Packing was left out for 30+ minutes without recurrent bleeding. Do not feel cautery or packing needed at this time. Lab work is reassuring, coumadin therapeutic.  Patient will be discharged home.  Encouraged to apply pressure +/- ice if  bleeding returns.  Discussed plan with patient, he/she acknowledged understanding and agreed with plan of care.  Return precautions given for new or worsening symptoms.  Case discussed with attending physician, Dr. Jeneen Rinks, who personally evaluated patient and agrees with assessment and plan of care.  Larene Pickett, PA-C 02/16/14 0001

## 2014-02-19 NOTE — ED Provider Notes (Signed)
Medical screening examination/treatment/procedure(s) were conducted as a shared visit with non-physician practitioner(s) and myself.  I personally evaluated the patient during the encounter.   EKG Interpretation None      Please see my additional dictation.  Tanna Furry, MD 02/19/14 [phone]

## 2014-03-08 DIAGNOSIS — I4891 Unspecified atrial fibrillation: Secondary | ICD-10-CM | POA: Diagnosis not present

## 2014-03-08 DIAGNOSIS — Z7901 Long term (current) use of anticoagulants: Secondary | ICD-10-CM | POA: Diagnosis not present

## 2014-03-21 DIAGNOSIS — I4891 Unspecified atrial fibrillation: Secondary | ICD-10-CM | POA: Diagnosis not present

## 2014-03-22 DIAGNOSIS — L723 Sebaceous cyst: Secondary | ICD-10-CM | POA: Diagnosis not present

## 2014-03-22 DIAGNOSIS — L821 Other seborrheic keratosis: Secondary | ICD-10-CM | POA: Diagnosis not present

## 2014-03-22 DIAGNOSIS — D239 Other benign neoplasm of skin, unspecified: Secondary | ICD-10-CM | POA: Diagnosis not present

## 2014-03-22 DIAGNOSIS — L570 Actinic keratosis: Secondary | ICD-10-CM | POA: Diagnosis not present

## 2014-03-22 DIAGNOSIS — Z85828 Personal history of other malignant neoplasm of skin: Secondary | ICD-10-CM | POA: Diagnosis not present

## 2014-04-11 DIAGNOSIS — I4891 Unspecified atrial fibrillation: Secondary | ICD-10-CM | POA: Diagnosis not present

## 2014-04-11 DIAGNOSIS — Z7901 Long term (current) use of anticoagulants: Secondary | ICD-10-CM | POA: Diagnosis not present

## 2014-05-14 ENCOUNTER — Ambulatory Visit: Payer: Medicare Other | Admitting: Cardiology

## 2014-05-31 DIAGNOSIS — I4891 Unspecified atrial fibrillation: Secondary | ICD-10-CM | POA: Diagnosis not present

## 2014-05-31 DIAGNOSIS — Z7901 Long term (current) use of anticoagulants: Secondary | ICD-10-CM | POA: Diagnosis not present

## 2014-06-07 DIAGNOSIS — Z7901 Long term (current) use of anticoagulants: Secondary | ICD-10-CM | POA: Diagnosis not present

## 2014-06-07 DIAGNOSIS — I4891 Unspecified atrial fibrillation: Secondary | ICD-10-CM | POA: Diagnosis not present

## 2014-06-11 ENCOUNTER — Ambulatory Visit: Payer: Medicare Other | Admitting: Cardiology

## 2014-06-19 DIAGNOSIS — Z7901 Long term (current) use of anticoagulants: Secondary | ICD-10-CM | POA: Diagnosis not present

## 2014-06-19 DIAGNOSIS — I4891 Unspecified atrial fibrillation: Secondary | ICD-10-CM | POA: Diagnosis not present

## 2014-07-22 ENCOUNTER — Encounter: Payer: Self-pay | Admitting: Cardiology

## 2014-07-22 ENCOUNTER — Ambulatory Visit (INDEPENDENT_AMBULATORY_CARE_PROVIDER_SITE_OTHER): Payer: Medicare Other | Admitting: Cardiology

## 2014-07-22 VITALS — BP 118/82 | HR 87 | Ht 67.0 in | Wt 238.0 lb

## 2014-07-22 DIAGNOSIS — E669 Obesity, unspecified: Secondary | ICD-10-CM | POA: Diagnosis not present

## 2014-07-22 DIAGNOSIS — I481 Persistent atrial fibrillation: Secondary | ICD-10-CM | POA: Diagnosis not present

## 2014-07-22 DIAGNOSIS — Z7901 Long term (current) use of anticoagulants: Secondary | ICD-10-CM | POA: Diagnosis not present

## 2014-07-22 DIAGNOSIS — I10 Essential (primary) hypertension: Secondary | ICD-10-CM | POA: Diagnosis not present

## 2014-07-22 DIAGNOSIS — I4819 Other persistent atrial fibrillation: Secondary | ICD-10-CM

## 2014-07-22 NOTE — Progress Notes (Signed)
[place]. [address], [address] [place], [place]  [address] Phone: [phone] Fax:  [phone]  Date:  07/22/2014   ID:  [name], DOB 1934-01-31, MRN [id number]  PCP:  Gennette Pac, MD   History of Present Illness: [name] [name] is a 81 y.o. female with atrial fibrillation, chronic anticoagulation, hypertension, hyperlipidemia, diabetes who at one point was supratherapeutic on Coumadin, had a fall on Xarelto and prior GI bleed.   Overall she is doing well. Her son recently got married, they are living at her house. Her daughter moved to [place].  No chest pain, no syncope, no bleeding, no orthopnea. She uses a walker for ambulation. Prior consult note from Dr. Marin Olp noted.   Wt Readings from Last 3 Encounters:  07/22/14 238 lb (107.956 kg)  02/20/13 246 lb 4 oz (111.698 kg)  10/23/12 247 lb (112.038 kg)     Past Medical History  Diagnosis Date  . Atrial fibrillation   . Hypertension   . Hyperlipidemia   . Osteoarthritis   . Diabetes     prediabetes    Past Surgical History  Procedure Laterality Date  . Wisdom tooth extraction    . Tonsillectomy    . Neck surgery    . Back surgery    . Knee surgery    . Total hip arthroplasty    . Cataract extraction  Apr 11, 2012, Jun 04 2012  . Carpal tunnel release Right     Current Outpatient Prescriptions  Medication Sig Dispense Refill  . bisoprolol-hydrochlorothiazide (ZIAC) 10-6.25 MG per tablet Take 1 tablet by mouth 2 (two) times daily.     . Calcium Carbonate-Vitamin D (CALTRATE 600+D PO) Take 1 tablet by mouth every Monday, Wednesday, and Friday.    . hydrocortisone cream 1 % Apply 1 application topically as needed. For itching under breasts    . losartan (COZAAR) 100 MG tablet Take 100 mg by mouth daily.    . Multiple Vitamins-Minerals (CENTRUM SILVER PO) Take by mouth 3 (three) times a week.    . Omega-3 Fatty Acids (FISH OIL) 1200 MG CAPS Take by mouth every morning.    Marland Kitchen oxybutynin  (DITROPAN) 5 MG tablet Take 5 mg by mouth 2 (two) times daily.    . rosuvastatin (CRESTOR) 10 MG tablet Take 10 mg by mouth. m w f at bedtime    . warfarin (COUMADIN) 3 MG tablet Take 3 mg by mouth daily.     No current facility-administered medications for this visit.    Allergies:   No Known Allergies  Social History:  The patient  reports that she has never smoked. She has never used smokeless tobacco. She reports that she drinks alcohol. She reports that she does not use illicit drugs.   Family History  Problem Relation Age of Onset  . Heart disease Mother   . Colon cancer Father     ROS:  Please see the history of present illness.   Denies any bleeding, syncope, orthopnea, PND   All other systems reviewed and negative.   PHYSICAL EXAM: VS:  BP 118/82 mmHg  Pulse 87  Ht 5\' 7"  (1.702 m)  Wt 238 lb (107.956 kg)  BMI 37.27 kg/m2 Well nourished, well developed, in no acute distress HEENT: normal, [place]/AT, EOMI Neck: no JVD, normal carotid upstroke, no bruit Cardiac:  normal S1, S2; irreg irreg; no murmur Lungs:  clear to auscultation bilaterally, no wheezing, rhonchi or rales Abd: soft, nontender, no  hepatomegaly, no bruitsobese Ext: no edema, 2+ distal pulses Skin: warm and dry GU: deferred Neuro: no focal abnormalities noted, AAO x 3  EKG:  07/22/14-atrial fibrillation, 87, no other abnormalities     ASSESSMENT AND PLAN:  1. Persistent atrial fibrillation-well rate controlled. No changes made. Medicines reviewed. 2. Chronic anticoagulation-Dr. Little has been following her Coumadin. No recent bleeding episodes. Prior gastrointestinal bleeding on Xarelto. 3. Obesity-10 pound weight loss. Excellent. 4. Essential hypertension-well controlled. Medications reviewed 5. Hyperlipidemia-Crestor. Monitored by Dr. Rex Kras. 6. 1 year follow up.  Signed, Candee Furbish, MD [facility]  07/22/2014 2:02 PM

## 2014-07-22 NOTE — Patient Instructions (Signed)
The current medical regimen is effective;  continue present plan and medications.  Follow up in 1 year with Dr. Skains.  You will receive a letter in the mail 2 months before you are due.  Please call us when you receive this letter to schedule your follow up appointment.  Thank you for choosing [facility]!!     

## 2014-07-26 DIAGNOSIS — I4891 Unspecified atrial fibrillation: Secondary | ICD-10-CM | POA: Diagnosis not present

## 2014-07-26 DIAGNOSIS — G5600 Carpal tunnel syndrome, unspecified upper limb: Secondary | ICD-10-CM | POA: Diagnosis not present

## 2014-07-26 DIAGNOSIS — Z7901 Long term (current) use of anticoagulants: Secondary | ICD-10-CM | POA: Diagnosis not present

## 2014-07-26 DIAGNOSIS — R197 Diarrhea, unspecified: Secondary | ICD-10-CM | POA: Diagnosis not present

## 2014-07-26 DIAGNOSIS — E1121 Type 2 diabetes mellitus with diabetic nephropathy: Secondary | ICD-10-CM | POA: Diagnosis not present

## 2014-07-26 DIAGNOSIS — I10 Essential (primary) hypertension: Secondary | ICD-10-CM | POA: Diagnosis not present

## 2014-08-21 DIAGNOSIS — H11441 Conjunctival cysts, right eye: Secondary | ICD-10-CM | POA: Diagnosis not present

## 2014-09-25 DIAGNOSIS — Z7901 Long term (current) use of anticoagulants: Secondary | ICD-10-CM | POA: Diagnosis not present

## 2014-09-25 DIAGNOSIS — I10 Essential (primary) hypertension: Secondary | ICD-10-CM | POA: Diagnosis not present

## 2014-09-25 DIAGNOSIS — E1121 Type 2 diabetes mellitus with diabetic nephropathy: Secondary | ICD-10-CM | POA: Diagnosis not present

## 2014-09-25 DIAGNOSIS — I4891 Unspecified atrial fibrillation: Secondary | ICD-10-CM | POA: Diagnosis not present

## 2014-10-09 DIAGNOSIS — D225 Melanocytic nevi of trunk: Secondary | ICD-10-CM | POA: Diagnosis not present

## 2014-10-09 DIAGNOSIS — Z85828 Personal history of other malignant neoplasm of skin: Secondary | ICD-10-CM | POA: Diagnosis not present

## 2014-10-09 DIAGNOSIS — Z86018 Personal history of other benign neoplasm: Secondary | ICD-10-CM | POA: Diagnosis not present

## 2014-10-09 DIAGNOSIS — L821 Other seborrheic keratosis: Secondary | ICD-10-CM | POA: Diagnosis not present

## 2014-11-14 DIAGNOSIS — R197 Diarrhea, unspecified: Secondary | ICD-10-CM | POA: Diagnosis not present

## 2014-11-14 DIAGNOSIS — E1121 Type 2 diabetes mellitus with diabetic nephropathy: Secondary | ICD-10-CM | POA: Diagnosis not present

## 2014-11-14 DIAGNOSIS — I4891 Unspecified atrial fibrillation: Secondary | ICD-10-CM | POA: Diagnosis not present

## 2014-11-15 DIAGNOSIS — Z7901 Long term (current) use of anticoagulants: Secondary | ICD-10-CM | POA: Diagnosis not present

## 2014-11-15 DIAGNOSIS — E1121 Type 2 diabetes mellitus with diabetic nephropathy: Secondary | ICD-10-CM | POA: Diagnosis not present

## 2014-11-15 DIAGNOSIS — I4891 Unspecified atrial fibrillation: Secondary | ICD-10-CM | POA: Diagnosis not present

## 2014-11-15 DIAGNOSIS — I10 Essential (primary) hypertension: Secondary | ICD-10-CM | POA: Diagnosis not present

## 2014-12-31 ENCOUNTER — Other Ambulatory Visit (INDEPENDENT_AMBULATORY_CARE_PROVIDER_SITE_OTHER): Payer: Medicare Other

## 2014-12-31 ENCOUNTER — Ambulatory Visit (INDEPENDENT_AMBULATORY_CARE_PROVIDER_SITE_OTHER): Payer: Medicare Other | Admitting: Internal Medicine

## 2014-12-31 ENCOUNTER — Encounter: Payer: Self-pay | Admitting: Internal Medicine

## 2014-12-31 VITALS — BP 138/88 | HR 72 | Ht 67.0 in | Wt 239.6 lb

## 2014-12-31 DIAGNOSIS — R197 Diarrhea, unspecified: Secondary | ICD-10-CM

## 2014-12-31 LAB — IGA: IgA: 328 mg/dL (ref 68–378)

## 2014-12-31 LAB — TSH: TSH: 2.08 u[IU]/mL (ref 0.35–4.50)

## 2014-12-31 LAB — SEDIMENTATION RATE: Sed Rate: 14 mm/hr (ref 0–22)

## 2014-12-31 MED ORDER — BENEFIBER PO POWD
ORAL | Status: DC
Start: 2014-12-31 — End: 2015-09-23

## 2014-12-31 MED ORDER — DICYCLOMINE HCL 10 MG PO CAPS: 10.0000 mg | ORAL_CAPSULE | Freq: Two times a day (BID) | ORAL | Status: DC

## 2014-12-31 NOTE — Progress Notes (Signed)
[name] [name] [birth year] [id number]  Note: This dictation was prepared with Dragon digital system. Any transcriptional errors that result from this procedure are unintentional.   History of Present Illness: This is a 81 year old white female last see t in August 2014. She presents today with  several months history of diarrhea. She used to have regular bowel movements on daily basis. Sje is now having urgent stools several times a day but not at night. She denies rectal bleeding or abdominal pain. She has been diagnosed with atrial fibrillation and has been on Coumadin followed by Dr.Skains, there is also  a history of hyperlipidemia, high blood pressure and diabetes. Last colonoscopy in September 2004 showed diverticulosis. Prior colonoscopies 1991, 1994 polyps removed, 1996, and 1999 adenomatous polyp removed. There is a family history of colon cancer in her sister and father. She has had intentional weight loss of 30 pounds. She has taken Imodium one or 2 a day on when necessary basis and has been several episodes of incontinence     Past Medical History  Diagnosis Date  . Atrial fibrillation   . Hypertension   . Hyperlipidemia   . Osteoarthritis   . Diabetes     prediabetes    Past Surgical History  Procedure Laterality Date  . Wisdom tooth extraction    . Tonsillectomy    . Neck surgery    . Back surgery    . Knee surgery    . Total hip arthroplasty    . Cataract extraction  Apr 11, 2012, Jun 04 2012  . Carpal tunnel release Right     Allergies  Allergen Reactions  . Nsaids   . Statins     Family history and social history have been reviewed.  Review of Systems: Denies rectal bleeding. Denies abdominal pain. Positive for intentional weight loss  The remainder of the 10 point ROS is negative except as outlined in the H&P  Physical Exam: General Appearance Well developed, in no distress, overweight  Eyes  Non icteric  HEENT  Non traumatic, normocephalic  Mouth No  lesion, tongue papillated, no cheilosis Neck Supple without adenopathy, thyroid not enlarged, no carotid bruits, no JVD Lungs Clear to auscultation bilaterally COR Normal S1, normal S2, regular rhythm, no murmur, quiet precordium Abdomen , obese, soft. Normoactive bowel sounds. Mild tenderness left lower quadrant  Rectal Decreased rectal sphincter tone. Small amount of Hemoccult-negative stool in the ampulla  Extremities  1+  pedal edema Skin No lesions Neurological Alert and oriented x 3 Psychological Normal mood and affect  Assessment and Plan:   81 year old white female with change in bowel habits toward diarrhea and urgency suggestive of either symptomatic diverticulosis or irritable bowel syndrome. She brought a newspaper article  advertising pancreatic enzyme supplements and is asking whether she may have pancreatic problems. I have  assured her that it is very unlikely. There is no hx of  alcohol abuse. We will however check her stool for pathogens. Lactoferrin and [place] stain. We will also obtain  sprue profile. IgA and TSH and sedimentation rate. She will start Bentyl 10 mg twice a day and Benefiber 1 heaping teaspoon daily. I will see her in 6-8 weeks.   Family history of colon cancer into close relative. Last colonoscopy 11 years ago. She is at the age where we don't do routine colorectal screening. I will consider Colo guard depending on the response to treatment     [name] 12/31/2014

## 2014-12-31 NOTE — Patient Instructions (Addendum)
Dr Hulan Fess.  Your physician has requested that you go to the basement for lab work before leaving today  We have sent the following medications to your pharmacy for you to pick up at your convenience:  Bentyl  You may use 1 heaping teaspoon of Benefiber in juice or water daily  Please follow up with Dr. Olevia Perches on 03/04/2015 at 1:45pm

## 2015-01-01 LAB — GLIA (IGA/G) + TTG IGA
GLIADIN IGG: 1 U (ref <20)
Gliadin IgA: 9 Units (ref <20)
Tissue Transglutaminase Ab, IgA: 1 U/mL (ref <4)

## 2015-01-02 ENCOUNTER — Telehealth: Payer: Self-pay | Admitting: Internal Medicine

## 2015-01-02 NOTE — Telephone Encounter (Signed)
Lm on vm that I have already initiated prior authorization on dicyclomine and awaiting a response.

## 2015-01-02 NOTE — Telephone Encounter (Signed)
[name] doing prior authorization.

## 2015-01-06 ENCOUNTER — Other Ambulatory Visit: Payer: Self-pay | Admitting: *Deleted

## 2015-01-06 ENCOUNTER — Telehealth: Payer: Self-pay | Admitting: Internal Medicine

## 2015-01-06 NOTE — Telephone Encounter (Signed)
Spoke with patient and she has not had any diarrhea since starting Benefiber. She did not collect stool sample because she has not had liquid stool.

## 2015-01-06 NOTE — Telephone Encounter (Signed)
I am glad she is better. We will cancel stool for pathogens.

## 2015-01-06 NOTE — Telephone Encounter (Signed)
Cancelled labs.

## 2015-01-08 ENCOUNTER — Telehealth: Payer: Self-pay

## 2015-01-08 NOTE — Telephone Encounter (Signed)
Patient was able to pick up the dicyclomine in spite of insurance issues.

## 2015-01-08 NOTE — Telephone Encounter (Signed)
Prior authorization on Dicyclomine was denied by [employer].  Sent Dr. Olevia Perches a message asking for a possible alternative.  Awaiting response

## 2015-01-08 NOTE — Telephone Encounter (Signed)
-----   Message from Lafayette Dragon, MD sent at 01/08/2015 12:48 PM EDT ----- Carolynne Edouard.375 mg, 1/2 po qam, #30, 1 refill. If not covered, try Robinul 2mg , 1 po qam, #30, 1 refill. ----- Message -----    From: Audrea Muscat, CMA    Sent: 01/08/2015   8:46 AM      To: Lafayette Dragon, MD  You prescribed Dicyclomine for this patient last week for her diarrhea and it was denied by her insurance.  Is there an alternative you would like me to try?  Thanks!

## 2015-01-08 NOTE — Telephone Encounter (Signed)
Patient was able to purchase Bentyl in spite of insurance issues.

## 2015-01-08 NOTE — Telephone Encounter (Signed)
Called patient to suggest alternatives to dicyclomine and was told patient was able to pick up the Bentyl in spite of the original insurance issues.  She stated she is doing so much better that she is going to continue taking Benefiber and keep the Bentyl on hand in case she has any abdominal pain or crampiong.

## 2015-01-13 ENCOUNTER — Other Ambulatory Visit: Payer: Medicare Other

## 2015-01-13 ENCOUNTER — Other Ambulatory Visit: Payer: Self-pay | Admitting: Internal Medicine

## 2015-01-13 DIAGNOSIS — R197 Diarrhea, unspecified: Secondary | ICD-10-CM

## 2015-01-15 LAB — FECAL FAT, QUALITATIVE: FECAL FAT QUALITATIVE: ABNORMAL — AB

## 2015-01-16 ENCOUNTER — Other Ambulatory Visit: Payer: Self-pay | Admitting: *Deleted

## 2015-01-16 DIAGNOSIS — R195 Other fecal abnormalities: Secondary | ICD-10-CM

## 2015-01-16 MED ORDER — PANCRELIPASE (LIP-PROT-AMYL) 12000-38000 UNITS PO CPEP: ORAL_CAPSULE | ORAL | Status: DC

## 2015-01-17 DIAGNOSIS — I4891 Unspecified atrial fibrillation: Secondary | ICD-10-CM | POA: Diagnosis not present

## 2015-01-17 DIAGNOSIS — Z7901 Long term (current) use of anticoagulants: Secondary | ICD-10-CM | POA: Diagnosis not present

## 2015-01-28 ENCOUNTER — Telehealth: Payer: Self-pay | Admitting: Internal Medicine

## 2015-01-28 NOTE — Telephone Encounter (Signed)
She can increase Dicyclomine to qid ac and hs on "bad days"

## 2015-01-28 NOTE — Telephone Encounter (Signed)
Patient is taking Creon and Dicyclomine. She had been doing better but yesterday and today she has had an increase in diarrhea and abdominal cramping again. She is asking what she can do. Please, advise.

## 2015-01-28 NOTE — Telephone Encounter (Signed)
Patient given recommendation. 

## 2015-01-30 DIAGNOSIS — I4891 Unspecified atrial fibrillation: Secondary | ICD-10-CM | POA: Diagnosis not present

## 2015-01-30 DIAGNOSIS — Z7901 Long term (current) use of anticoagulants: Secondary | ICD-10-CM | POA: Diagnosis not present

## 2015-02-03 DIAGNOSIS — H35033 Hypertensive retinopathy, bilateral: Secondary | ICD-10-CM | POA: Diagnosis not present

## 2015-02-03 DIAGNOSIS — Z961 Presence of intraocular lens: Secondary | ICD-10-CM | POA: Diagnosis not present

## 2015-02-03 DIAGNOSIS — H40019 Open angle with borderline findings, low risk, unspecified eye: Secondary | ICD-10-CM | POA: Diagnosis not present

## 2015-02-03 DIAGNOSIS — H1859 Other hereditary corneal dystrophies: Secondary | ICD-10-CM | POA: Diagnosis not present

## 2015-02-19 DIAGNOSIS — I4891 Unspecified atrial fibrillation: Secondary | ICD-10-CM | POA: Diagnosis not present

## 2015-02-19 DIAGNOSIS — Z7901 Long term (current) use of anticoagulants: Secondary | ICD-10-CM | POA: Diagnosis not present

## 2015-02-20 ENCOUNTER — Encounter: Payer: Self-pay | Admitting: Internal Medicine

## 2015-02-20 ENCOUNTER — Ambulatory Visit (INDEPENDENT_AMBULATORY_CARE_PROVIDER_SITE_OTHER): Payer: Medicare Other | Admitting: Internal Medicine

## 2015-02-20 VITALS — BP 130/80 | HR 78 | Ht 67.0 in | Wt 239.0 lb

## 2015-02-20 DIAGNOSIS — R197 Diarrhea, unspecified: Secondary | ICD-10-CM | POA: Diagnosis not present

## 2015-02-20 DIAGNOSIS — Z800 Family history of malignant neoplasm of digestive organs: Secondary | ICD-10-CM | POA: Diagnosis not present

## 2015-02-20 DIAGNOSIS — K589 Irritable bowel syndrome without diarrhea: Secondary | ICD-10-CM

## 2015-02-20 DIAGNOSIS — R195 Other fecal abnormalities: Secondary | ICD-10-CM

## 2015-02-20 MED ORDER — PANCRELIPASE (LIP-PROT-AMYL) 12000-38000 UNITS PO CPEP: ORAL_CAPSULE | ORAL | Status: DC

## 2015-02-20 NOTE — Patient Instructions (Addendum)
We have sent the following medications to your pharmacy for you to pick up at your convenience:  Creon  You may use Imodium as needed Dr Hulan Fess

## 2015-02-20 NOTE — Progress Notes (Signed)
[name] [name] 1933-08-09 [id number]  Note: This dictation was prepared with Dragon digital system. Any transcriptional errors that result from this procedure are unintentional.   History of Present Illness: This is a 81 year old white female who presents for follow-up of urgent, loose stools. Last office visit July 2016. Stool for [place] stain was positive for increased fat, sprue profile was negative. TSH was normal at 2.08. Sedimentation rate was normal at 14. She has been improved on all urination of Benefiber 1 heaping teaspoon daily and pancreatic enzymes to 3 times a day. She still has occasional episodes of urgent diarrhea suggestive of an irritable bowel syndrome. She denies any rectal bleeding. She is had numerous colonoscopies in the past in 1991, 1994 when polyps were removed. [place] when adenoma was removed. Last colonoscopy in 2004 showed diverticulosis. She no longer wants recall colonoscopies. Her sister had colon cancer   Past Medical History  Diagnosis Date  . Atrial fibrillation   . Hypertension   . Hyperlipidemia   . Osteoarthritis   . Diabetes     prediabetes    Past Surgical History  Procedure Laterality Date  . Wisdom tooth extraction    . Tonsillectomy    . Neck surgery    . Back surgery    . Knee surgery    . Total hip arthroplasty    . Cataract extraction  Apr 11, 2012, Jun 04 2012  . Carpal tunnel release Right     Allergies  Allergen Reactions  . Nsaids   . Statins     Family history and social history have been reviewed.  Review of Systems:  Denies abdominal pain rectal bleeding weight loss  The remainder of the 10 point ROS is negative except as outlined in the H&P  Physical Exam: General Appearance Well developed, in no distress, overweight Eyes  Non icteric  HEENT  Non traumatic, normocephalic  Mouth No lesion, tongue papillated, no cheilosis Neck Supple without adenopathy, thyroid not enlarged, no carotid bruits, no JVD Lungs Clear  to auscultation bilaterally COR Normal S1, normal S2, regular rhythm, no murmur, quiet precordium Abdomen  soft nontender abdomen with normoactive bowel sounds. No mass. No distention or tympany Rectal  not repeated Extremities  No pedal edema Skin No lesions Neurological Alert and oriented x 3 Psychological Normal mood and affect  Assessment and Plan:   81 year old white female with an irritable bowel syndrome. There is no history of pancreatitis or pancreatic  Insufficiency or small bowl mucosal disease. She shows no clinical evidence for malabsorption.. She is not sure the pancreatic enzymes are actually helping.Marland Kitchen She will decrease to one capsule with each meal. She may also take Imodium 2 mg when necessary diarrhea. She will continue Benefiber and we will increase it to 1 tablespoon daily. She does  not want to have a repeat colonoscopy. Last exam 2009. She will be followed by Dr.Nandigam.    [name] [name] 02/20/2015

## 2015-02-27 ENCOUNTER — Emergency Department (HOSPITAL_COMMUNITY)
Admission: EM | Admit: 2015-02-27 | Discharge: 2015-02-27 | Disposition: A | Discharge status: Home / Self Care | Payer: Medicare Other | Attending: Emergency Medicine | Admitting: Emergency Medicine

## 2015-02-27 ENCOUNTER — Encounter (HOSPITAL_COMMUNITY): Payer: Self-pay | Admitting: *Deleted

## 2015-02-27 ENCOUNTER — Emergency Department (HOSPITAL_COMMUNITY): Payer: Medicare Other

## 2015-02-27 DIAGNOSIS — W228XXA Striking against or struck by other objects, initial encounter: Secondary | ICD-10-CM | POA: Diagnosis not present

## 2015-02-27 DIAGNOSIS — I10 Essential (primary) hypertension: Secondary | ICD-10-CM | POA: Insufficient documentation

## 2015-02-27 DIAGNOSIS — Z87891 Personal history of nicotine dependence: Secondary | ICD-10-CM | POA: Insufficient documentation

## 2015-02-27 DIAGNOSIS — I4891 Unspecified atrial fibrillation: Secondary | ICD-10-CM | POA: Insufficient documentation

## 2015-02-27 DIAGNOSIS — Y998 Other external cause status: Secondary | ICD-10-CM | POA: Insufficient documentation

## 2015-02-27 DIAGNOSIS — M79605 Pain in left leg: Secondary | ICD-10-CM | POA: Diagnosis not present

## 2015-02-27 DIAGNOSIS — Z79899 Other long term (current) drug therapy: Secondary | ICD-10-CM | POA: Diagnosis not present

## 2015-02-27 DIAGNOSIS — Y9389 Activity, other specified: Secondary | ICD-10-CM | POA: Insufficient documentation

## 2015-02-27 DIAGNOSIS — Z7901 Long term (current) use of anticoagulants: Secondary | ICD-10-CM | POA: Insufficient documentation

## 2015-02-27 DIAGNOSIS — E785 Hyperlipidemia, unspecified: Secondary | ICD-10-CM | POA: Insufficient documentation

## 2015-02-27 DIAGNOSIS — E119 Type 2 diabetes mellitus without complications: Secondary | ICD-10-CM | POA: Diagnosis not present

## 2015-02-27 DIAGNOSIS — Y9289 Other specified places as the place of occurrence of the external cause: Secondary | ICD-10-CM | POA: Diagnosis not present

## 2015-02-27 DIAGNOSIS — S8012XA Contusion of left lower leg, initial encounter: Secondary | ICD-10-CM | POA: Diagnosis not present

## 2015-02-27 DIAGNOSIS — M1990 Unspecified osteoarthritis, unspecified site: Secondary | ICD-10-CM | POA: Insufficient documentation

## 2015-02-27 DIAGNOSIS — S8992XA Unspecified injury of left lower leg, initial encounter: Secondary | ICD-10-CM | POA: Diagnosis present

## 2015-02-27 LAB — BASIC METABOLIC PANEL
Anion gap: 10 (ref 5–15)
BUN: 19 mg/dL (ref 6–20)
CALCIUM: 9.6 mg/dL (ref 8.9–10.3)
CO2: 27 mmol/L (ref 22–32)
CREATININE: 0.93 mg/dL (ref 0.44–1.00)
Chloride: 103 mmol/L (ref 101–111)
GFR calc non Af Amer: 57 mL/min — ABNORMAL LOW (ref >60)
Glucose, Bld: 112 mg/dL — ABNORMAL HIGH (ref 65–99)
Potassium: 4.1 mmol/L (ref 3.5–5.1)
SODIUM: 140 mmol/L (ref 135–145)

## 2015-02-27 LAB — CBC WITH DIFFERENTIAL/PLATELET
BASOS PCT: 1 % (ref 0–1)
Basophils Absolute: 0 10*3/uL (ref 0.0–0.1)
EOS ABS: 0.1 10*3/uL (ref 0.0–0.7)
Eosinophils Relative: 2 % (ref 0–5)
HCT: 44.2 % (ref 36.0–46.0)
HEMOGLOBIN: 14.3 g/dL (ref 12.0–15.0)
LYMPHS ABS: 1.6 10*3/uL (ref 0.7–4.0)
Lymphocytes Relative: 20 % (ref 12–46)
MCH: 30.8 pg (ref 26.0–34.0)
MCHC: 32.4 g/dL (ref 30.0–36.0)
MCV: 95.3 fL (ref 78.0–100.0)
MONO ABS: 0.9 10*3/uL (ref 0.1–1.0)
Monocytes Relative: 12 % (ref 3–12)
NEUTROS PCT: 65 % (ref 43–77)
Neutro Abs: 5.3 10*3/uL (ref 1.7–7.7)
Platelets: 205 10*3/uL (ref 150–400)
RBC: 4.64 MIL/uL (ref 3.87–5.11)
RDW: 13.7 % (ref 11.5–15.5)
WBC: 7.9 10*3/uL (ref 4.0–10.5)

## 2015-02-27 LAB — PROTIME-INR
INR: 2.49 — ABNORMAL HIGH (ref 0.00–1.49)
PROTHROMBIN TIME: 26.6 s — AB (ref 11.6–15.2)

## 2015-02-27 MED ORDER — HYDROCODONE-ACETAMINOPHEN 5-325 MG PO TABS: 1.0000 | ORAL_TABLET | Freq: Four times a day (QID) | ORAL | Status: DC | PRN

## 2015-02-27 NOTE — ED Provider Notes (Signed)
CSN: [id number]     Arrival date & time 02/27/15  1500 History   First MD Initiated Contact with Patient 02/27/15 [phone]     Chief Complaint  Patient presents with  . Leg Pain     (Consider location/radiation/quality/duration/timing/severity/associated sxs/prior Treatment) Patient is a 81 y.o. female presenting with leg pain. History provided by: The patient hit her left lower leg against a car door. She developed swelling anterior on that leg. She went to her family doctor today who told her to come to the emergency room to be evaluated.  Leg Pain Location:  Leg Leg location:  L leg Pain details:    Quality:  Aching   Radiates to:  Does not radiate   Severity:  Moderate   Onset quality:  Sudden Associated symptoms: no back pain and no fatigue     Past Medical History  Diagnosis Date  . Atrial fibrillation   . Hypertension   . Hyperlipidemia   . Osteoarthritis   . Diabetes     prediabetes   Past Surgical History  Procedure Laterality Date  . Wisdom tooth extraction    . Tonsillectomy    . Neck surgery    . Back surgery    . Knee surgery    . Total hip arthroplasty    . Cataract extraction  Apr 11, 2012, Jun 04 2012  . Carpal tunnel release Right    Family History  Problem Relation Age of Onset  . Heart disease Mother   . Colon cancer Father     dx in his late 34's, died age 58  . Uterine cancer Sister 104    spread to colon   Social History  Substance Use Topics  . Smoking status: Former Smoker -- 20 years    Types: Cigarettes  . Smokeless tobacco: Never Used  . Alcohol Use: 0.0 oz/week    0 Standard drinks or equivalent per week     Comment: social   OB History    No data available     Review of Systems  Constitutional: Negative for appetite change and fatigue.  HENT: Negative for congestion, ear discharge and sinus pressure.   Eyes: Negative for discharge.  Respiratory: Negative for cough.   Cardiovascular: Negative for chest pain.  Gastrointestinal:  Negative for abdominal pain and diarrhea.  Genitourinary: Negative for frequency and hematuria.  Musculoskeletal: Negative for back pain.       Pain left lower leg with bruising and swelling anterior  Skin: Negative for rash.  Neurological: Negative for seizures and headaches.  Psychiatric/Behavioral: Negative for hallucinations.      Allergies  Nsaids and Statins  Home Medications   Prior to Admission medications   Medication Sig Start Date End Date Taking? Authorizing Provider  bisoprolol-hydrochlorothiazide (ZIAC) 10-6.25 MG per tablet Take 1 tablet by mouth 2 (two) times daily.    Yes Historical Provider, MD  Cholecalciferol (VITAMIN D) 2000 UNITS tablet Take 2,000 Units by mouth every other day.   Yes Historical Provider, MD  Cyanocobalamin (B-12 PO) Take 1 tablet by mouth every other day.   Yes Historical Provider, MD  dicyclomine (BENTYL) 10 MG capsule Take 20 mg by mouth daily. Take 2 capsules daily on an empty stomach at least 1/2 hour before a meal 01/05/15  Yes Historical Provider, MD  lipase/protease/amylase (CREON) 12000 UNITS CPEP capsule Take 2 tablets with each meal 3 times a day Patient taking differently: 12,000 Units. Take 2 capsules with each meal and 1 capsule with  a snack 02/20/15  Yes Lafayette Dragon, MD  losartan (COZAAR) 100 MG tablet Take 100 mg by mouth daily.   Yes Historical Provider, MD  Omega-3 Fatty Acids (FISH OIL) 1200 MG CAPS Take by mouth every other day.    Yes Historical Provider, MD  oxybutynin (DITROPAN) 5 MG tablet Take 5 mg by mouth 2 (two) times daily.   Yes Historical Provider, MD  ranitidine (ZANTAC) 75 MG tablet Take 75 mg by mouth daily as needed for heartburn.   Yes Historical Provider, MD  rosuvastatin (CRESTOR) 10 MG tablet Take 10 mg by mouth. m w f at bedtime   Yes Historical Provider, MD  warfarin (COUMADIN) 3 MG tablet Take 2-3 mg by mouth daily. Take 3mg  on Monday,Wednesday and Friday and take 2mg  on Tuesday, Thursday, Saturday and  Sunday   Yes Historical Provider, MD  HYDROcodone-acetaminophen (NORCO/VICODIN) 5-325 MG per tablet Take 1 tablet by mouth every 6 (six) hours as needed for moderate pain. 02/27/15   Milton Ferguson, MD  Wheat Dextrin (BENEFIBER) POWD Mix 1 heaping teaspoon in water or juice daily Patient not taking: Reported on 02/27/2015 12/31/14   Lafayette Dragon, MD   BP 182/102 mmHg  Pulse 68  Temp(Src) 98.1 F (36.7 C) (Oral)  Resp 18  SpO2 97% Physical Exam  Constitutional: She is oriented to person, place, and time. She appears well-developed.  HENT:  Head: Normocephalic.  Eyes: Conjunctivae are normal.  Neck: No tracheal deviation present.  Cardiovascular:  No murmur heard. Musculoskeletal:  Patient has a large hematoma anterior halfway  between her left knee and ankle. Skin is not tight. Neuro exam normal. Good dorsalis pedis pulse.  Neurological: She is oriented to person, place, and time.  Skin: Skin is warm.  Psychiatric: She has a normal mood and affect.    ED Course  Procedures (including critical care time) Labs Review Labs Reviewed  PROTIME-INR - Abnormal; Notable for the following:    Prothrombin Time 26.6 (*)    INR 2.49 (*)    All other components within normal limits  BASIC METABOLIC PANEL - Abnormal; Notable for the following:    Glucose, Bld 112 (*)    GFR calc non Af Amer 57 (*)    All other components within normal limits  CBC WITH DIFFERENTIAL/PLATELET    Imaging Review Dg Tibia/fibula Left  02/27/2015   CLINICAL DATA:  Pain and swelling in the left lower leg after striking the like on a door yesterday. Erythema and swelling. Anti coagulation.  EXAM: LEFT TIBIA AND FIBULA - 2 VIEW  COMPARISON:  04/10/2013  FINDINGS: Total knee prosthesis. Hindfoot and midfoot spurring with large plantar and Achilles calcaneal spurs. 1.6 cm ossific fragment along the anterior margin of Hoffa's fat pad.  Abnormal multi focal masslike densities anterior to the tibia primarily in the subcutaneous  tissues, compatible with hematomas. No underlying acute fracture.  IMPRESSION: 1. Anterior subcutaneous hematoma is along the tibia. Although these particular areas are likely subcutaneous rather than in the anterior compartment, separate involvement of the muscular compartments of the lower leg is not excluded by conventional radiography. CT, MRI, or compartment manometry may be helpful if there is specific concern for compartment syndrome. 2. Hindfoot and midfoot spurring. 3. 1.6 cm ossific fragment projects posterior to or along the patella tendon, along the anterior margin of Hoffa's fat pad.   Electronically Signed   By: Van Clines M.D.   On: 02/27/2015 15:38   I have personally reviewed and  evaluated these images and lab results as part of my medical decision-making.   EKG Interpretation None      MDM   Final diagnoses:  Contusion of leg, left, initial encounter    Contusion to left lower leg. Do not suspect compartment syndrome. The contusion seems to be superficial. I spoke with orthopedic surgeon on call. He suggested elevation ice follow-up with their group in a few days. We will continue her Coumadin for her atrial fibrillation.    Milton Ferguson, MD 02/27/15 [phone]

## 2015-02-27 NOTE — Discharge Instructions (Signed)
Keep leg elevated,  Use ice on contusion.  Return if getting worse or pain worse,  Call tomorrow your orthopedic md for follow up next week.

## 2015-02-27 NOTE — ED Notes (Signed)
MD aware of both of pt's BP. Pt ok to be discharged.

## 2015-02-27 NOTE — ED Notes (Signed)
Pt complains of pain and swelling in her left lower leg since hitting her leg yesterday. Pt's left lower  leg appears red and swollen. Pt is on coumadin, pt's PCP is concerned pt may have compartment syndrome.

## 2015-02-27 NOTE — ED Notes (Signed)
Pt has a large hematoma on the left anterior leg.  Pt reports hitting the car door on leg.  Pt on blood thinners and got INR done today at her PCP. Pt has blood work results at bedside.  INR done today is 3.1.

## 2015-03-04 ENCOUNTER — Ambulatory Visit: Payer: PRIVATE HEALTH INSURANCE | Admitting: Internal Medicine

## 2015-03-04 DIAGNOSIS — S8012XA Contusion of left lower leg, initial encounter: Secondary | ICD-10-CM | POA: Diagnosis not present

## 2015-03-04 DIAGNOSIS — M79662 Pain in left lower leg: Secondary | ICD-10-CM | POA: Diagnosis not present

## 2015-03-07 DIAGNOSIS — I4891 Unspecified atrial fibrillation: Secondary | ICD-10-CM | POA: Diagnosis not present

## 2015-03-07 DIAGNOSIS — S8012XD Contusion of left lower leg, subsequent encounter: Secondary | ICD-10-CM | POA: Diagnosis not present

## 2015-03-07 DIAGNOSIS — M79662 Pain in left lower leg: Secondary | ICD-10-CM | POA: Diagnosis not present

## 2015-03-07 DIAGNOSIS — Z7901 Long term (current) use of anticoagulants: Secondary | ICD-10-CM | POA: Diagnosis not present

## 2015-03-14 ENCOUNTER — Emergency Department (HOSPITAL_COMMUNITY)
Admission: EM | Admit: 2015-03-14 | Discharge: 2015-03-15 | Disposition: A | Discharge status: Home / Self Care | Payer: Medicare Other | Attending: Emergency Medicine | Admitting: Emergency Medicine

## 2015-03-14 ENCOUNTER — Emergency Department (HOSPITAL_COMMUNITY): Payer: Medicare Other

## 2015-03-14 ENCOUNTER — Encounter (HOSPITAL_COMMUNITY): Payer: Self-pay | Admitting: *Deleted

## 2015-03-14 DIAGNOSIS — S79912A Unspecified injury of left hip, initial encounter: Secondary | ICD-10-CM | POA: Diagnosis not present

## 2015-03-14 DIAGNOSIS — M1990 Unspecified osteoarthritis, unspecified site: Secondary | ICD-10-CM | POA: Diagnosis not present

## 2015-03-14 DIAGNOSIS — Y9289 Other specified places as the place of occurrence of the external cause: Secondary | ICD-10-CM | POA: Insufficient documentation

## 2015-03-14 DIAGNOSIS — Y9389 Activity, other specified: Secondary | ICD-10-CM | POA: Insufficient documentation

## 2015-03-14 DIAGNOSIS — Z87891 Personal history of nicotine dependence: Secondary | ICD-10-CM | POA: Insufficient documentation

## 2015-03-14 DIAGNOSIS — I4891 Unspecified atrial fibrillation: Secondary | ICD-10-CM | POA: Insufficient documentation

## 2015-03-14 DIAGNOSIS — S76012A Strain of muscle, fascia and tendon of left hip, initial encounter: Secondary | ICD-10-CM | POA: Diagnosis not present

## 2015-03-14 DIAGNOSIS — R1032 Left lower quadrant pain: Secondary | ICD-10-CM | POA: Diagnosis not present

## 2015-03-14 DIAGNOSIS — Z7901 Long term (current) use of anticoagulants: Secondary | ICD-10-CM | POA: Insufficient documentation

## 2015-03-14 DIAGNOSIS — E785 Hyperlipidemia, unspecified: Secondary | ICD-10-CM | POA: Insufficient documentation

## 2015-03-14 DIAGNOSIS — S8012XD Contusion of left lower leg, subsequent encounter: Secondary | ICD-10-CM | POA: Diagnosis not present

## 2015-03-14 DIAGNOSIS — R531 Weakness: Secondary | ICD-10-CM | POA: Diagnosis not present

## 2015-03-14 DIAGNOSIS — I10 Essential (primary) hypertension: Secondary | ICD-10-CM | POA: Diagnosis not present

## 2015-03-14 DIAGNOSIS — W2209XA Striking against other stationary object, initial encounter: Secondary | ICD-10-CM | POA: Diagnosis not present

## 2015-03-14 DIAGNOSIS — S8992XA Unspecified injury of left lower leg, initial encounter: Secondary | ICD-10-CM | POA: Diagnosis present

## 2015-03-14 DIAGNOSIS — Z79899 Other long term (current) drug therapy: Secondary | ICD-10-CM | POA: Insufficient documentation

## 2015-03-14 DIAGNOSIS — E119 Type 2 diabetes mellitus without complications: Secondary | ICD-10-CM | POA: Insufficient documentation

## 2015-03-14 DIAGNOSIS — Y998 Other external cause status: Secondary | ICD-10-CM | POA: Insufficient documentation

## 2015-03-14 DIAGNOSIS — M79662 Pain in left lower leg: Secondary | ICD-10-CM | POA: Diagnosis not present

## 2015-03-14 DIAGNOSIS — R404 Transient alteration of awareness: Secondary | ICD-10-CM | POA: Diagnosis not present

## 2015-03-14 MED ORDER — HYDROMORPHONE HCL 1 MG/ML IJ SOLN
0.5000 mg | Freq: Once | INTRAMUSCULAR | Status: AC
  Administered 2015-03-14: 0.5 mg via INTRAMUSCULAR
  Filled 2015-03-14: qty 1

## 2015-03-14 MED ORDER — FENTANYL CITRATE (PF) 100 MCG/2ML IJ SOLN
50.0000 ug | Freq: Once | INTRAMUSCULAR | Status: DC
  Filled 2015-03-14: qty 2

## 2015-03-14 MED ORDER — ONDANSETRON 8 MG PO TBDP
8.0000 mg | ORAL_TABLET | Freq: Once | ORAL | Status: AC
  Administered 2015-03-14: 8 mg via ORAL
  Filled 2015-03-14: qty 1

## 2015-03-14 MED ORDER — HYDROCODONE-ACETAMINOPHEN 5-325 MG PO TABS: 1.0000 | ORAL_TABLET | ORAL | Status: DC | PRN

## 2015-03-14 NOTE — Discharge Instructions (Signed)
Groin Strain °A groin strain (also called a groin pull) is an injury to the muscles or tendon on the upper inner part of the thigh. These muscles are called the adductor muscles or groin muscles. They are responsible for moving the leg across the body. A muscle strain occurs when a muscle is overstretched and some muscle fibers are torn. A groin strain can range from mild to severe depending on how many muscle fibers are affected and whether the muscle fibers are partially or completely torn.  °Groin strains usually occur during exercise or participation in sports. The injury often happens when a sudden, violent force is placed on a muscle, stretching the muscle too far. A strain is more likely to occur when your muscles are not warmed up or if you are not properly conditioned. Depending on the severity of the groin strain, recovery time may vary from a few weeks to several weeks. Severe injuries often require 4-6 weeks for recovery. In these cases, complete healing can take 4-5 months.  °CAUSES  °· Stretching the groin muscles too far or too suddenly, often during side-to-side motion with an abrupt change in direction. °· Putting repeated stress on the groin muscles over a long period of time. °· Performing vigorous activity without properly stretching the groin muscles beforehand. °SYMPTOMS  °· Pain and tenderness in the groin area. This begins as sharp pain and persists as a dull ache. °· Popping or snapping feeling when the injury occurs (for severe strains). °· Swelling or bruising. °· Muscle spasms. °· Weakness in the leg. °· Stiffness in the groin area with decreased ability to move the affected muscles. °DIAGNOSIS  °Your caregiver will perform a physical exam to diagnose a groin strain. You will be asked about your symptoms and how the injury occurred. X-rays are sometimes needed to rule out a broken bone or cartilage problems. Your caregiver may order a CT scan or MRI if a complete muscle tear is  suspected. °TREATMENT  °A groin strain will often heal on its own. Your caregiver may prescribe medicines to help manage pain and swelling (anti-inflammatory medicine). You may be told to use crutches for the first few days to minimize your pain. °HOME CARE INSTRUCTIONS  °· Rest. Do not use the strained muscle if it causes pain. °· Put ice on the injured area. °¨ Put ice in a plastic bag. °¨ Place a towel between your skin and the bag. °¨ Leave the ice on for 15-20 minutes, every 2-3 hours. Do this for the first 2 days after the injury.  °· Only take over-the-counter or prescription medicines as directed by your caregiver. °· Wrap the injured area with an elastic bandage as directed by your caregiver. °· Keep the injured leg raised (elevated). °· Walk, stretch, and perform range-of-motion exercises to improve blood flow to the injured area. Only perform these activities if you can do so without any pain. °To prevent muscle strains: °· Warm up before exercise. °· Develop proper conditioning and strength in the groin muscles. °SEEK IMMEDIATE MEDICAL CARE IF:  °· You have increased pain or swelling in the affected area.   °· Your symptoms are not improving or are getting worse. °MAKE SURE YOU:  °· Understand these instructions. °· Will watch your condition. °· Will get help right away if you are not doing well or get worse. °Document Released: 02/10/2004 Document Revised: 05/31/2012 Document Reviewed: 02/16/2012 °[employer]® Patient Information ©2015 [employer]. This information is not intended to replace advice given to you   by your health care provider. Make sure you discuss any questions you have with your health care provider. ° °

## 2015-03-14 NOTE — ED Notes (Signed)
Patient is from home. Pt reports around 5 pm she attempted lift up and felt a pop left hip/groin area. Pt has hx of hip replacement. No shortening or rotation, pain 8/10. Pt has edema in LLE as a result of injury from car door. Flexeril taken at home  160/108,80, Hx HTN. Ambulatory with walker.

## 2015-03-14 NOTE — ED Provider Notes (Signed)
CSN: [id number]     Arrival date & time 03/14/15  2041 History   First MD Initiated Contact with Patient 03/14/15 2050     Chief Complaint  Patient presents with  . Leg Pain    left groin      (Consider location/radiation/quality/duration/timing/severity/associated sxs/prior Treatment) HPI Comments: Patient presents to the ER for evaluation of left leg pain. Patient has been experiencing pain and swelling of her left lower leg for 2 weeks after she hit it on a car door. She was diagnosed with hematoma and has seen her doctor and her orthopedic doctor for this. She visited her orthopedic doctor today and was told that it was looking much improved. After going home, however, she lifted her leg to get on the bed and felt severe pain in the anterior portion of the left hip. She reports that she has had multiple surgeries including hip replacement and revisions for dislocations in that hip previously. Pain is moderate to severe, constant and worsens with movement. She did not fall.  Patient is a 81 y.o. female presenting with leg pain.  Leg Pain   Past Medical History  Diagnosis Date  . Atrial fibrillation   . Hypertension   . Hyperlipidemia   . Osteoarthritis   . Diabetes     prediabetes   Past Surgical History  Procedure Laterality Date  . Wisdom tooth extraction    . Tonsillectomy    . Neck surgery    . Back surgery    . Knee surgery    . Total hip arthroplasty    . Cataract extraction  Apr 11, 2012, Jun 04 2012  . Carpal tunnel release Right    Family History  Problem Relation Age of Onset  . Heart disease Mother   . Colon cancer Father     dx in his late 32's, died age 8  . Uterine cancer Sister 16    spread to colon   Social History  Substance Use Topics  . Smoking status: Former Smoker -- 20 years    Types: Cigarettes  . Smokeless tobacco: Never Used  . Alcohol Use: 0.0 oz/week    0 Standard drinks or equivalent per week     Comment: social   OB History    No  data available     Review of Systems  Musculoskeletal: Positive for arthralgias.  All other systems reviewed and are negative.     Allergies  Nsaids and Statins  Home Medications   Prior to Admission medications   Medication Sig Start Date End Date Taking? Authorizing Provider  bisoprolol-hydrochlorothiazide (ZIAC) 10-6.25 MG per tablet Take 1 tablet by mouth 2 (two) times daily.     Historical Provider, MD  Cholecalciferol (VITAMIN D) 2000 UNITS tablet Take 2,000 Units by mouth every other day.    Historical Provider, MD  Cyanocobalamin (B-12 PO) Take 1 tablet by mouth every other day.    Historical Provider, MD  dicyclomine (BENTYL) 10 MG capsule Take 20 mg by mouth daily. Take 2 capsules daily on an empty stomach at least 1/2 hour before a meal 01/05/15   Historical Provider, MD  HYDROcodone-acetaminophen (NORCO/VICODIN) 5-325 MG per tablet Take 1 tablet by mouth every 6 (six) hours as needed for moderate pain. 02/27/15   Milton Ferguson, MD  lipase/protease/amylase (CREON) 12000 UNITS CPEP capsule Take 2 tablets with each meal 3 times a day Patient taking differently: 12,000 Units. Take 2 capsules with each meal and 1 capsule with a snack 02/20/15  Lafayette Dragon, MD  losartan (COZAAR) 100 MG tablet Take 100 mg by mouth daily.    Historical Provider, MD  Omega-3 Fatty Acids (FISH OIL) 1200 MG CAPS Take by mouth every other day.     Historical Provider, MD  oxybutynin (DITROPAN) 5 MG tablet Take 5 mg by mouth 2 (two) times daily.    Historical Provider, MD  ranitidine (ZANTAC) 75 MG tablet Take 75 mg by mouth daily as needed for heartburn.    Historical Provider, MD  rosuvastatin (CRESTOR) 10 MG tablet Take 10 mg by mouth. m w f at bedtime    Historical Provider, MD  warfarin (COUMADIN) 3 MG tablet Take 2-3 mg by mouth daily. Take 3mg  on Monday,Wednesday and Friday and take 2mg  on Tuesday, Thursday, Saturday and Sunday    Historical Provider, MD  Wheat Dextrin (BENEFIBER) POWD Mix 1  heaping teaspoon in water or juice daily Patient not taking: Reported on 02/27/2015 12/31/14   Lafayette Dragon, MD   There were no vitals taken for this visit. Physical Exam  Constitutional: She is oriented to person, place, and time. She appears well-developed and well-nourished. No distress.  HENT:  Head: Normocephalic and atraumatic.  Right Ear: Hearing normal.  Left Ear: Hearing normal.  Nose: Nose normal.  Mouth/Throat: Oropharynx is clear and moist and mucous membranes are normal.  Eyes: Conjunctivae and EOM are normal. Pupils are equal, round, and reactive to light.  Neck: Normal range of motion. Neck supple.  Cardiovascular: Regular rhythm, S1 normal and S2 normal.  Exam reveals no gallop and no friction rub.   No murmur heard. Pulmonary/Chest: Effort normal and breath sounds normal. No respiratory distress. She exhibits no tenderness.  Abdominal: Soft. Normal appearance and bowel sounds are normal. There is no hepatosplenomegaly. There is no tenderness. There is no rebound, no guarding, no tenderness at McBurney's point and negative Murphy's sign. No hernia.  Musculoskeletal:       Left hip: She exhibits decreased range of motion (painful inhibition) and tenderness.       Left lower leg: She exhibits swelling.       Legs: Neurological: She is alert and oriented to person, place, and time. She has normal strength. No cranial nerve deficit or sensory deficit. Coordination normal. GCS eye subscore is 4. GCS verbal subscore is 5. GCS motor subscore is 6.  Skin: Skin is warm, dry and intact. No rash noted. No cyanosis.  Psychiatric: She has a normal mood and affect. Her speech is normal and behavior is normal. Thought content normal.  Nursing note and vitals reviewed.   ED Course  Procedures (including critical care time) Labs Review Labs Reviewed - No data to display  Imaging Review No results found. I have personally reviewed and evaluated these images and lab results as part of my  medical decision-making.   EKG Interpretation None      MDM   Final diagnoses:  None   acute muscle strain  Patient presents to the ER for evaluation of pain in the anterior portion of the left hip. Patient lifted her leg and felt sudden pain in the area. 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 X-ray does not show any abnormality. Patient feeling much better after analgesia.    Orpah Greek, MD 03/14/15 2350

## 2015-03-14 NOTE — ED Notes (Signed)
Bed: WA18 Expected date:  Expected time:  Means of arrival:  Comments: EMS 

## 2015-03-15 DIAGNOSIS — S76012A Strain of muscle, fascia and tendon of left hip, initial encounter: Secondary | ICD-10-CM | POA: Diagnosis not present

## 2015-03-15 MED ORDER — HYDROMORPHONE HCL 1 MG/ML IJ SOLN
0.5000 mg | Freq: Once | INTRAMUSCULAR | Status: AC
Start: 2015-03-15 — End: 2015-03-15
  Administered 2015-03-15: 0.5 mg via INTRAVENOUS
  Filled 2015-03-15: qty 1

## 2015-04-02 DIAGNOSIS — Z7901 Long term (current) use of anticoagulants: Secondary | ICD-10-CM | POA: Diagnosis not present

## 2015-04-02 DIAGNOSIS — I4891 Unspecified atrial fibrillation: Secondary | ICD-10-CM | POA: Diagnosis not present

## 2015-04-16 DIAGNOSIS — I4891 Unspecified atrial fibrillation: Secondary | ICD-10-CM | POA: Diagnosis not present

## 2015-04-16 DIAGNOSIS — Z7901 Long term (current) use of anticoagulants: Secondary | ICD-10-CM | POA: Diagnosis not present

## 2015-05-16 DIAGNOSIS — Z7901 Long term (current) use of anticoagulants: Secondary | ICD-10-CM | POA: Diagnosis not present

## 2015-05-16 DIAGNOSIS — I4891 Unspecified atrial fibrillation: Secondary | ICD-10-CM | POA: Diagnosis not present

## 2015-05-28 DIAGNOSIS — Z7901 Long term (current) use of anticoagulants: Secondary | ICD-10-CM | POA: Diagnosis not present

## 2015-06-18 DIAGNOSIS — I4891 Unspecified atrial fibrillation: Secondary | ICD-10-CM | POA: Diagnosis not present

## 2015-06-18 DIAGNOSIS — Z7901 Long term (current) use of anticoagulants: Secondary | ICD-10-CM | POA: Diagnosis not present

## 2015-07-17 DIAGNOSIS — Z7901 Long term (current) use of anticoagulants: Secondary | ICD-10-CM | POA: Diagnosis not present

## 2015-07-17 DIAGNOSIS — I4891 Unspecified atrial fibrillation: Secondary | ICD-10-CM | POA: Diagnosis not present

## 2015-08-05 DIAGNOSIS — R5381 Other malaise: Secondary | ICD-10-CM | POA: Diagnosis not present

## 2015-08-05 DIAGNOSIS — I4891 Unspecified atrial fibrillation: Secondary | ICD-10-CM | POA: Diagnosis not present

## 2015-08-05 DIAGNOSIS — M25559 Pain in unspecified hip: Secondary | ICD-10-CM | POA: Diagnosis not present

## 2015-08-05 DIAGNOSIS — E7800 Pure hypercholesterolemia, unspecified: Secondary | ICD-10-CM | POA: Diagnosis not present

## 2015-08-05 DIAGNOSIS — R5383 Other fatigue: Secondary | ICD-10-CM | POA: Diagnosis not present

## 2015-08-05 DIAGNOSIS — N183 Chronic kidney disease, stage 3 (moderate): Secondary | ICD-10-CM | POA: Diagnosis not present

## 2015-08-05 DIAGNOSIS — I10 Essential (primary) hypertension: Secondary | ICD-10-CM | POA: Diagnosis not present

## 2015-08-05 DIAGNOSIS — E1121 Type 2 diabetes mellitus with diabetic nephropathy: Secondary | ICD-10-CM | POA: Diagnosis not present

## 2015-08-14 DIAGNOSIS — J988 Other specified respiratory disorders: Secondary | ICD-10-CM | POA: Diagnosis not present

## 2015-08-14 DIAGNOSIS — Z7901 Long term (current) use of anticoagulants: Secondary | ICD-10-CM | POA: Diagnosis not present

## 2015-09-02 DIAGNOSIS — Z96643 Presence of artificial hip joint, bilateral: Secondary | ICD-10-CM | POA: Diagnosis not present

## 2015-09-02 DIAGNOSIS — R29898 Other symptoms and signs involving the musculoskeletal system: Secondary | ICD-10-CM | POA: Diagnosis not present

## 2015-09-08 DIAGNOSIS — M79662 Pain in left lower leg: Secondary | ICD-10-CM | POA: Diagnosis not present

## 2015-09-08 DIAGNOSIS — Z87891 Personal history of nicotine dependence: Secondary | ICD-10-CM | POA: Diagnosis not present

## 2015-09-08 DIAGNOSIS — I4891 Unspecified atrial fibrillation: Secondary | ICD-10-CM | POA: Diagnosis not present

## 2015-09-08 DIAGNOSIS — M1991 Primary osteoarthritis, unspecified site: Secondary | ICD-10-CM | POA: Diagnosis not present

## 2015-09-08 DIAGNOSIS — M6281 Muscle weakness (generalized): Secondary | ICD-10-CM | POA: Diagnosis not present

## 2015-09-08 DIAGNOSIS — R7309 Other abnormal glucose: Secondary | ICD-10-CM | POA: Diagnosis not present

## 2015-09-10 ENCOUNTER — Encounter (HOSPITAL_COMMUNITY): Payer: Self-pay | Admitting: Emergency Medicine

## 2015-09-10 ENCOUNTER — Emergency Department (HOSPITAL_COMMUNITY): Payer: Medicare Other

## 2015-09-10 ENCOUNTER — Emergency Department (HOSPITAL_COMMUNITY)
Admission: EM | Admit: 2015-09-10 | Discharge: 2015-09-10 | Disposition: A | Discharge status: Home / Self Care | Payer: Medicare Other | Attending: Physician Assistant | Admitting: Physician Assistant

## 2015-09-10 DIAGNOSIS — M1990 Unspecified osteoarthritis, unspecified site: Secondary | ICD-10-CM | POA: Diagnosis not present

## 2015-09-10 DIAGNOSIS — E785 Hyperlipidemia, unspecified: Secondary | ICD-10-CM | POA: Diagnosis not present

## 2015-09-10 DIAGNOSIS — G8929 Other chronic pain: Secondary | ICD-10-CM | POA: Diagnosis not present

## 2015-09-10 DIAGNOSIS — R52 Pain, unspecified: Secondary | ICD-10-CM | POA: Diagnosis not present

## 2015-09-10 DIAGNOSIS — M79661 Pain in right lower leg: Secondary | ICD-10-CM | POA: Insufficient documentation

## 2015-09-10 DIAGNOSIS — Z87891 Personal history of nicotine dependence: Secondary | ICD-10-CM | POA: Insufficient documentation

## 2015-09-10 DIAGNOSIS — R102 Pelvic and perineal pain: Secondary | ICD-10-CM | POA: Diagnosis not present

## 2015-09-10 DIAGNOSIS — Z471 Aftercare following joint replacement surgery: Secondary | ICD-10-CM | POA: Diagnosis not present

## 2015-09-10 DIAGNOSIS — I10 Essential (primary) hypertension: Secondary | ICD-10-CM | POA: Diagnosis not present

## 2015-09-10 DIAGNOSIS — M7981 Nontraumatic hematoma of soft tissue: Secondary | ICD-10-CM | POA: Diagnosis not present

## 2015-09-10 DIAGNOSIS — Z7901 Long term (current) use of anticoagulants: Secondary | ICD-10-CM | POA: Diagnosis not present

## 2015-09-10 DIAGNOSIS — R531 Weakness: Secondary | ICD-10-CM | POA: Diagnosis not present

## 2015-09-10 DIAGNOSIS — M79604 Pain in right leg: Secondary | ICD-10-CM

## 2015-09-10 DIAGNOSIS — M25561 Pain in right knee: Secondary | ICD-10-CM | POA: Diagnosis present

## 2015-09-10 DIAGNOSIS — M79606 Pain in leg, unspecified: Secondary | ICD-10-CM | POA: Diagnosis not present

## 2015-09-10 DIAGNOSIS — Z79899 Other long term (current) drug therapy: Secondary | ICD-10-CM | POA: Diagnosis not present

## 2015-09-10 DIAGNOSIS — M79651 Pain in right thigh: Secondary | ICD-10-CM | POA: Diagnosis not present

## 2015-09-10 DIAGNOSIS — Z96651 Presence of right artificial knee joint: Secondary | ICD-10-CM | POA: Diagnosis not present

## 2015-09-10 LAB — PROTIME-INR
INR: 2.4 — ABNORMAL HIGH (ref 0.00–1.49)
PROTHROMBIN TIME: 25.9 s — AB (ref 11.6–15.2)

## 2015-09-10 MED ORDER — CYCLOBENZAPRINE HCL 5 MG PO TABS: 5.0000 mg | ORAL_TABLET | Freq: Two times a day (BID) | ORAL | Status: DC | PRN

## 2015-09-10 MED ORDER — OXYCODONE-ACETAMINOPHEN 5-325 MG PO TABS
1.0000 | ORAL_TABLET | Freq: Once | ORAL | Status: AC
  Administered 2015-09-10: 1 via ORAL
  Filled 2015-09-10: qty 1

## 2015-09-10 MED ORDER — HYDROCODONE-ACETAMINOPHEN 5-325 MG PO TABS: 1.0000 | ORAL_TABLET | ORAL | Status: DC | PRN

## 2015-09-10 MED ORDER — OXYCODONE-ACETAMINOPHEN 5-325 MG PO TABS
1.0000 | ORAL_TABLET | Freq: Once | ORAL | Status: AC
Start: 2015-09-10 — End: 2015-09-10
  Administered 2015-09-10: 1 via ORAL
  Filled 2015-09-10: qty 1

## 2015-09-10 NOTE — Progress Notes (Addendum)
Noted pt's bedside commode bedside pt's son  Noted son eating pt's sandwich He was given [facility] information Pending referral Cm spoke with Dr Eulis Foster about pt request for muscle relaxant He will evaluate her  Pt updated about this ED RN updated   IN basket message to Dr Wynelle Link

## 2015-09-10 NOTE — ED Notes (Addendum)
Attempted to ambulate patient with walker.  Patient continuously crying before sitting up stating "I can't do it, my knee hurts too bad".  Patient initially reported this began hurting her this morning but then stated that it was hurting her last night without injury.  Reports this is an ongoing issue.  Upon trying to stand patient she was sliding her right leg under walker and continued to cry stating "I can't do it".  Pt appears to intentionally be trying to make herself fall. Patient stating walker was not her walker so she could not use it after it was adjusted to the correct height.  Patient placed back in bed but was able to push herself up in bed using both right and left leg without difficulty.  MD made aware.

## 2015-09-10 NOTE — ED Notes (Signed)
20 g SL removed from left arm.  Catheter intact.

## 2015-09-10 NOTE — ED Provider Notes (Signed)
CSN: [id number]     Arrival date & time 09/10/15  1003 History   First MD Initiated Contact with Patient 09/10/15 1137     Chief Complaint  Patient presents with  . Knee Pain     (Consider location/radiation/quality/duration/timing/severity/associated sxs/prior Treatment) HPI  Patient is a 82 year old female presenting with right leg pain. Patient has been chronically ill. She's been unable to leave her house for the last couple month because of deconditioning. Patient did go to her knee physician Dr. Dahlia Client week ago and was able to both leave the house and come back.  . Patient states that she has bilateral leg pain chronically. She states that today she got up to the bathroom she felt even more weak than usual and had more pain on the right leg than usual. She had no fall. No trauma. No twisting motion.   Patient uses a walker at baseline and reports chronic weakness for the last several months.  It is difficult to tell whether this today's any different than her baseline weakness and chronic pain.  Past Medical History  Diagnosis Date  . Atrial fibrillation ([place])   . Hypertension   . Hyperlipidemia   . Osteoarthritis   . Diabetes ([address])     prediabetes   Past Surgical History  Procedure Laterality Date  . Wisdom tooth extraction    . Tonsillectomy    . Neck surgery    . Back surgery    . Knee surgery    . Total hip arthroplasty    . Cataract extraction  Apr 11, 2012, Jun 04 2012  . Carpal tunnel release Right    Family History  Problem Relation Age of Onset  . Heart disease Mother   . Colon cancer Father     dx in his late 45's, died age 1  . Uterine cancer Sister 63    spread to colon   Social History  Substance Use Topics  . Smoking status: Former Smoker -- 20 years    Types: Cigarettes  . Smokeless tobacco: Never Used  . Alcohol Use: 0.0 oz/week    0 Standard drinks or equivalent per week     Comment: social   OB History    No data available      Review of Systems  Constitutional: Positive for activity change.  Respiratory: Negative for shortness of breath.   Cardiovascular: Negative for chest pain.  Gastrointestinal: Negative for abdominal pain.  Musculoskeletal: Positive for gait problem.      Allergies  Nsaids and Statins  Home Medications   Prior to Admission medications   Medication Sig Start Date End Date Taking? Authorizing Provider  bisoprolol-hydrochlorothiazide (ZIAC) 10-6.25 MG per tablet Take 1 tablet by mouth 2 (two) times daily.     Historical Provider, MD  Cholecalciferol (VITAMIN D) 2000 UNITS tablet Take 2,000 Units by mouth every other day.    Historical Provider, MD  Cyanocobalamin (B-12 PO) Take 1 tablet by mouth every other day.    Historical Provider, MD  HYDROcodone-acetaminophen (NORCO/VICODIN) 5-325 MG per tablet Take 1-2 tablets by mouth every 4 (four) hours as needed for moderate pain. 03/14/15   Orpah Greek, MD  lipase/protease/amylase (CREON) 12000 UNITS CPEP capsule Take 2 tablets with each meal 3 times a day Patient taking differently: 12,000 Units. Take 2 capsules with each meal and 1 capsule with a snack 02/20/15   Lafayette Dragon, MD  losartan (COZAAR) 100 MG tablet Take 100 mg by mouth daily.  Historical Provider, MD  Omega-3 Fatty Acids (FISH OIL) 1200 MG CAPS Take 1 capsule by mouth every other day.     Historical Provider, MD  oxybutynin (DITROPAN) 5 MG tablet Take 5 mg by mouth 2 (two) times daily.    Historical Provider, MD  ranitidine (ZANTAC) 75 MG tablet Take 75 mg by mouth daily as needed for heartburn.    Historical Provider, MD  rosuvastatin (CRESTOR) 10 MG tablet Take 10 mg by mouth at bedtime. m w f at bedtime    Historical Provider, MD  warfarin (COUMADIN) 3 MG tablet Take 3 mg by mouth daily.     Historical Provider, MD  Wheat Dextrin (BENEFIBER) POWD Mix 1 heaping teaspoon in water or juice daily Patient not taking: Reported on 02/27/2015 12/31/14   Lafayette Dragon, MD    BP 165/99 mmHg  Pulse 82  Temp(Src) 97.5 F (36.4 C) (Oral)  Resp 16  SpO2 99% Physical Exam  Constitutional: She is oriented to person, place, and time. She appears well-developed and well-nourished.  HENT:  Head: Normocephalic and atraumatic.  Eyes: Conjunctivae are normal. Right eye exhibits no discharge.  Neck: Neck supple.  Cardiovascular: Normal rate, regular rhythm and normal heart sounds.   No murmur heard. Pulmonary/Chest: Effort normal and breath sounds normal. She has no wheezes. She has no rales.  Abdominal: Soft. She exhibits no distension. There is no tenderness.  Musculoskeletal: Normal range of motion. She exhibits no edema.  Scars on bilateral knees. Patient has 1 c m circumferential bruise above right knee.  Patient is distractible tenderness to the right leg. She can move the right leg both passive and active. Compartments are soft. Pulses intact distally.  Neurological: She is oriented to person, place, and time. No cranial nerve deficit.  Skin: Skin is warm and dry. No rash noted. She is not diaphoretic.  Psychiatric:   tearful on exam  Nursing note and vitals reviewed.   ED Course  Procedures (including critical care time) Labs Review Labs Reviewed  PROTIME-INR - Abnormal; Notable for the following:    Prothrombin Time 25.9 (*)    INR 2.40 (*)    All other components within normal limits    Imaging Review Dg Pelvis 1-2 Views  09/10/2015  CLINICAL DATA:  Severe right-sided pelvic and thigh pain since last night. No known injury. EXAM: PELVIS - 1-2 VIEW COMPARISON:  10/28/2008 FINDINGS: There is no fracture or bone destruction or other acute abnormality. The bowel gas pattern is normal. Bilateral total hip prostheses. Extensive lumbar fusion and posterior decompression. Dystrophic calcifications in the soft tissues adjacent to the right greater trochanter. IMPRESSION: No acute abnormalities. Electronically Signed   By: Lorriane Shire M.D.   On:  09/10/2015 14:53   Dg Knee Complete 4 Views Right  09/10/2015  CLINICAL DATA:  82 year old female with a history of right knee pain. EXAM: RIGHT KNEE - COMPLETE 4+ VIEW COMPARISON:  None. FINDINGS: Surgical changes of prior right knee arthroplasty. No fracture identified. No joint effusion or significant soft tissue swelling. Vascular calcifications of the femoral popliteal system. IMPRESSION: Surgical changes of right knee arthroplasty, with no fracture identified. Vascular calcifications. Signed, Dulcy Fanny. Earleen Newport, DO [facility] Electronically Signed   By: Corrie Mckusick D.O.   On: 09/10/2015 10:58   Dg Femur, Min 2 Views Right  09/10/2015  CLINICAL DATA:  Right-sided femur pain, no known injury, initial encounter EXAM: RIGHT FEMUR 2 VIEWS COMPARISON:  None. FINDINGS: Right  hip and knee prostheses are seen. No acute fracture or loosening is noted. No soft tissue abnormality is seen. IMPRESSION: Postsurgical changes without acute abnormality. Electronically Signed   By: Inez Catalina M.D.   On: 09/10/2015 14:50   I have personally reviewed and evaluated these images and lab results as part of my medical decision-making.   EKG Interpretation None      MDM   Final diagnoses:  None  Patient is a 82 year old female presenting with deconditioning. Patient complains of right knee pain. She's had no trauma to this knee. Patient reports that the knee pain is gotten worse. She reports weakness that has been progressive over the last several months. She is tearful on exam. Denies SI or HI. Patient reports she has not left her house in 2 months except to go to Doctor Alioso's office a week and half ago where he found no acute intervention necessary on either of her chronic weak legs.  Patient has a 1 c m  bruise on the right knee where she is tender. Patient uses a walker at baseline.  I do not think there is anything acutely going on with this  patient. Her x-rays negative, she has tenderness isolvated to a small bruise.  She denies any real trauma. We will get case management involved to maximize home services. We'll give her something for pain and have her walk.  1:33 PM SW invovled.  Pt would like INR done. Patient walked several steps with walker. Will increase services at home. Might benefit from rehab or nursing home but paitent doesn't meet inpatietn hospitlization requirements.   3:23 PM Patient requested an INR done here so she doesn't have to go to primary care's office this week. We will do that. We'll get x-rays of the entire limb to make sure there is no fracture or anything that we are missing. Pulses continue to be intact along with sensation, and ability to move.   Jayelle Page Julio Alm, MD 09/10/15 1523

## 2015-09-10 NOTE — ED Notes (Signed)
PT info me she could not get out of bed due to being in too much pain.

## 2015-09-10 NOTE — ED Notes (Signed)
Son stating "she can't walk up steps & I have a bad back.  I can spend the night but I can't help her."  Maudie Mercury, CCM informed & is now @ BS.

## 2015-09-10 NOTE — Progress Notes (Addendum)
ED CM consulted for a 82 year old female presenting with right leg pain.  THN consulted for traditional medicare [place] pt PMh DM, afib, osteoarthritis, decreased mobility 2+ months, several knee surgeries (left)) by Dr Wynelle Link- 2 ED visits 0 admissions last 6 months pcp kevin Little Dr. Wynelle Link seen a week ago Pt taken to appt by her daughter in law (son & sister lives locally- another daughter in [place])- was able to both leave the house and come back. Pt states sister came and stayed with her from Sunday until Tuesday States she has "lady coming in except on Wednesdays" Had her grandson (went back to [place]) and son (lives locally, works in [employer] [place], got married 2016) prior to that When assessed pt states she does not believe EDP "believes me" "nothing showed up on xray" CM discussed pain episode may not have been duplicated or could be detected at time of xray States she is aware of EDP wanting her to ambulate but she "don't think I can" While CM present with pt, noted pt flinching when she moved right leg on stretcher Stated Dr Wynelle Link wants her to have a cadaver tendon placed but she refused related having to be immobile for "three months"  Reports she would not have been about to afford getting assistance for that period of time CM present when ED NTx 2 and RN attempt to get pt to ambulate with RW, 2+ assist Pt got off stretcher x 2 Pt hesitant to move right leg but when able noted to lift it off floor x 3 consecutively Pt moving left LE without difficulty Pt needed encouragement to straight her body and look up vs at her legs and floor.  Pt stepped forward and backwards Bilateral leg pain chronically. Wt 239 lb listed in EPIC. She states that today she got up to the bathroom she felt even more weak than usual and had more pain on the right leg than usual.  Pt confirms having DME at home to include w/c, rw, shower chair Agrees to bedside commode and additional home health CM  reviewed in details medicare guidelines for admission & related to home health, home health [facility]) (length of stay in home, types of [facility] staff available, coverage, primary caregiver, up to 24 hrs before services may be started) and Private duty nursing (PDN-coverage, length of stay in the home types of staff available). CM reviewed availability of [place] [facility] to assist pcp to get pt to snf (if desired disposition) from the community level. CM provided pt with a list of [place] home health agencies and PDN.  States Dr Wynelle Link called in [place] from [place] was scheduled to arrive today at "eleven o'clock but the pain was too bad so I came here"  States [place] worked with her earlier this week. Pt states she is scheduled for PT (prothitm) for pcp office and inquired if can be done in ED. EDP/RN aware Pt states she has a one level home with 3 steps to get in home Pt inquired about EMS taking her home Cm discussed criteria for ems to take her home  ED Cm spoke with Tim of [facility] about initiated ED orders for HH/DME/THN (ortho bundle)

## 2015-09-10 NOTE — ED Notes (Signed)
MD at bedside. 

## 2015-09-10 NOTE — Progress Notes (Addendum)
ED Cm consulted by ED RN to speak with pt son, Jenny Reichmann who is now at bedside ED Cm discussed  Pt informed CM she had her Sister and pt states she has "lady coming in except on Wednesdays" Son states they can ask her to come be with pt "more often" CM discussed pain episode may not have been duplicated or could be detected at time of xray Son agreed with CM  Reviewed Dr Wynelle Link wants her to have a cadaver tendon placed but she refused related having to be immobile for "three months"  CM reviewed that she was present when ED NTx 2 and RN attempt to get pt to ambulate with RW, 2+ assist Pt got off stretcher x 2 Pt hesitant to move right leg but when able noted to lift it off floor x 3 consecutively Pt moving left LE without difficulty Pt needed encouragement to straight her body and look up vs at her legs and floor. Pt stepped forward and backwards Son agreed to received bedside commode at [facility] from [facility] to take to pt home vs going to [facility] store Pura Spice contacted to bring it to son   CM reviewed pt confirmed having DME at home to include w/c, rw, shower chair Agreed to bedside commode and additional home health CM reviewed in details medicare guidelines for admission & related to home health, home health [facility]) (length of stay in home, types of [facility] staff available, coverage, primary caregiver, up to 24 hrs before services may be started) and Private duty nursing (PDN-coverage, length of stay in the home types of staff available). CM reviewed availability of [place] SW to assist pcp to get pt to snf (if desired disposition) from the community level. CM referred son to list of [place] home health agencies and PDN he was presently writing on Cm discussed option of PDN   Reviewed that Dr Wynelle Link called in [place] from [place] was scheduled to arrive today at "eleven o'clock but the pain was too bad so I came here" States [place] worked with her earlier this week. Reviewed that PT (Prothrombin)  for pcp office done in ED and Dr Hulan Fess will receive it Reviewed that pt states she has a one level home with 3 steps to get in home Pt inquired about EMS taking her home Cm discussed criteria for ems to take her home Son states only he and his wife can assist pt Denies assist from pt sister CM reviewed pt stated he was getting dental services and his wife took her to appts Cm at intervals turned to pt to ask if she had told son what she ans CM had discussed Pt stated " he has been busy since he has been here" Son noted to be working on his laptop   Pt noted to be tearful but unable to tell Cm what was the issue at end of discussion with her son

## 2015-09-10 NOTE — ED Notes (Signed)
Patient able to ambulate with assistance.  MD made aware.

## 2015-09-10 NOTE — ED Notes (Addendum)
Per EMS-was getting up from bed this am and turned wrong-sharp above right knee pain

## 2015-09-10 NOTE — Progress Notes (Signed)
[facility]    [facility] is providing the following services: [employer]  If patient discharges after hours, please call [phone].   Linward Headland 09/10/2015, 3:47 PM

## 2015-09-10 NOTE — Discharge Instructions (Signed)
We are sorry about the weakness and pain in her legs. We could not find a cause for this today. We have talked to social work and increase her home services and hope this helps.   We know that has been troubling you for a couple months and want you to follow up with Dr. Sanjuana Kava as well as your primary care physician Dr. Rex Kras.   Pain Without a Known Cause WHAT IS PAIN WITHOUT A KNOWN CAUSE? Pain can occur in any part of the body and can range from mild to severe. Sometimes no cause can be found for why you are having pain. Some types of pain that can occur without a known cause include:   Headache.  Back pain.  Abdominal pain.  Neck pain. HOW IS PAIN WITHOUT A KNOWN CAUSE DIAGNOSED?  Your health care provider will try to find the cause of your pain. This may include:  Physical exam.  Medical history.  Blood tests.  Urine tests.  X-rays. If no cause is found, your health care provider may diagnose you with pain without a known cause.  IS THERE TREATMENT FOR PAIN WITHOUT A CAUSE?  Treatment depends on the kind of pain you have. Your health care provider may prescribe medicines to help relieve your pain.  WHAT CAN I DO AT HOME FOR MY PAIN?   Take medicines only as directed by your health care provider.  Stop any activities that cause pain. During periods of severe pain, bed rest may help.  Try to reduce your stress with activities such as yoga or meditation. Talk to your health care provider for other stress-reducing activity recommendations.  Exercise regularly, if approved by your health care provider.  Eat a healthy diet that includes fruits and vegetables. This may improve pain. Talk to your health care provider if you have any questions about your diet. WHAT IF MY PAIN DOES NOT GET BETTER?  If you have a painful condition and no reason can be found for the pain or the pain gets worse, it is important to follow up with your health care provider. It may be necessary to  repeat tests and look further for a possible cause.    This information is not intended to replace advice given to you by your health care provider. Make sure you discuss any questions you have with your health care provider.   Document Released: 03/09/2001 Document Revised: 07/05/2014 Document Reviewed: 10/30/2013 Elsevier Interactive Patient Education 2016 Elsevier Inc.  Musculoskeletal Pain Musculoskeletal pain is muscle and boney aches and pains. These pains can occur in any part of the body. Your caregiver may treat you without knowing the cause of the pain. They may treat you if blood or urine tests, X-rays, and other tests were normal.  CAUSES There is often not a definite cause or reason for these pains. These pains may be caused by a type of germ (virus). The discomfort may also come from overuse. Overuse includes working out too hard when your body is not fit. Boney aches also come from weather changes. Bone is sensitive to atmospheric pressure changes. HOME CARE INSTRUCTIONS   Ask when your test results will be ready. Make sure you get your test results.  Only take over-the-counter or prescription medicines for pain, discomfort, or fever as directed by your caregiver. If you were given medications for your condition, do not drive, operate machinery or power tools, or sign legal documents for 24 hours. Do not drink alcohol. Do not take sleeping  pills or other medications that may interfere with treatment.  Continue all activities unless the activities cause more pain. When the pain lessens, slowly resume normal activities. Gradually increase the intensity and duration of the activities or exercise.  During periods of severe pain, bed rest may be helpful. Lay or sit in any position that is comfortable.  Putting ice on the injured area.  Put ice in a bag.  Place a towel between your skin and the bag.  Leave the ice on for 15 to 20 minutes, 3 to 4 times a day.  Follow up with  your caregiver for continued problems and no reason can be found for the pain. If the pain becomes worse or does not go away, it may be necessary to repeat tests or do additional testing. Your caregiver may need to look further for a possible cause. SEEK IMMEDIATE MEDICAL CARE IF:  You have pain that is getting worse and is not relieved by medications.  You develop chest pain that is associated with shortness or breath, sweating, feeling sick to your stomach (nauseous), or throw up (vomit).  Your pain becomes localized to the abdomen.  You develop any new symptoms that seem different or that concern you. MAKE SURE YOU:   Understand these instructions.  Will watch your condition.  Will get help right away if you are not doing well or get worse.   This information is not intended to replace advice given to you by your health care provider. Make sure you discuss any questions you have with your health care provider.   Document Released: 06/14/2005 Document Revised: 09/06/2011 Document Reviewed: 02/16/2013 Elsevier Interactive Patient Education [employer].

## 2015-09-10 NOTE — Progress Notes (Signed)
Fax confirmation received at [phone] for clinicals PT/INR results sent to [place] [facility]

## 2015-09-10 NOTE — ED Provider Notes (Signed)
Patient evaluated after Dr. Thomasene Lot left because she did not "have a prescription for muscle relaxer." The patient states that she typically takes Flexeril for the type of pain she is having in her right medial thigh. She did have some improvement with the Percocet that was given earlier by Dr. Thomasene Lot.  [place] asked me to give her a second Percocet, after she left.  At this time. Patient is complaining of pain in the right medial thigh, and shows that she has pain when she attempts to flex her right hip. She requests prescriptions for muscle relaxer, and analgesia. Additional medication prescriptions written- Flexeril # 15, Norco # [place], MD 09/10/15 1651

## 2015-09-11 DIAGNOSIS — I4891 Unspecified atrial fibrillation: Secondary | ICD-10-CM | POA: Diagnosis not present

## 2015-09-11 DIAGNOSIS — R7309 Other abnormal glucose: Secondary | ICD-10-CM | POA: Diagnosis not present

## 2015-09-11 DIAGNOSIS — M6281 Muscle weakness (generalized): Secondary | ICD-10-CM | POA: Diagnosis not present

## 2015-09-11 DIAGNOSIS — M79662 Pain in left lower leg: Secondary | ICD-10-CM | POA: Diagnosis not present

## 2015-09-11 DIAGNOSIS — Z87891 Personal history of nicotine dependence: Secondary | ICD-10-CM | POA: Diagnosis not present

## 2015-09-11 DIAGNOSIS — M1991 Primary osteoarthritis, unspecified site: Secondary | ICD-10-CM | POA: Diagnosis not present

## 2015-09-12 ENCOUNTER — Other Ambulatory Visit: Payer: Self-pay | Admitting: *Deleted

## 2015-09-12 NOTE — Patient Outreach (Signed)
[name]) Care Management  09/12/2015  [name] [name] 1933/11/27 [id number]   Assessment: Care coordination call Referral received from [facility] emergency room for right lower extremity pain (history of Osteoarthritis) with decreased mobility (in last 2 months) related to lower extremity weakness. Patient had 2 emergency room visits in the last 6 months. Call placed and spoke with patient. Care management coordinator introduced self and explained purpose of the call.  Patient reports living alone but she has a son [name] [name]) and daughter-in-law [name] [name]) who live close by who assist her with care if needed.  [name] [name] informed care management coordinator that she was prescribed Flexeril as needed for muscle spasm on her last emergency room visit which is giving her better relief than pain medication (Vicodin).  She is able to walk with a walker. Patient shared using shower chair and bedside commode as well.  Patient states that she manages and takes her own medications as ordered. She mentioned that she has adverse reaction to statins but she is on Crestor for about 2 years without any problem.   [facility] home [facility] physical therapy is currently working with patient twice a week. She verbalized hiring a private pay aide to assist with her care and house chores 3- 5 hours a week and a housekeeper comes every other week to assist in managing her housekeeping needs. Transportation to appointments is being provided by her daughter-in law as stated.  [name] [name] has a scheduled appointment to see orthopedist (Dr. Wynelle Link) on 3/31 to follow-up with lower extremity pain and weakness. She informed care management coordinator that she will be calling primary care provider's office early next week to set-up a follow-up appointment and to discuss regarding need for a nurse to possibly check INR at home since she has difficulty with mobility to go back and forth to doctor's office for regular check of  INR.  Patient had notified care management coordinator that conversation will be cut off due to low battery charge. She agreed to be called back next week to set-up a home visit schedule since her calendar is out of her reach at this moment.     Plan: Will schedule patient for next outreach call to set-up an initial home visit.   [name] [name], BSN, RN-BC [place] Management Coordinator Cell: [phone]

## 2015-09-15 ENCOUNTER — Inpatient Hospital Stay (HOSPITAL_COMMUNITY): Payer: Medicare Other

## 2015-09-15 ENCOUNTER — Encounter (HOSPITAL_COMMUNITY): Payer: Self-pay | Admitting: *Deleted

## 2015-09-15 ENCOUNTER — Other Ambulatory Visit: Payer: Self-pay | Admitting: Surgical

## 2015-09-15 ENCOUNTER — Inpatient Hospital Stay (HOSPITAL_COMMUNITY)
Admission: AD | Admit: 2015-09-15 | Discharge: 2015-09-23 | DRG: 519 | Disposition: A | Discharge status: Skilled Nursing Facility | Payer: Medicare Other | Source: Ambulatory Visit | Attending: Internal Medicine | Admitting: Internal Medicine

## 2015-09-15 DIAGNOSIS — E785 Hyperlipidemia, unspecified: Secondary | ICD-10-CM | POA: Diagnosis not present

## 2015-09-15 DIAGNOSIS — Z66 Do not resuscitate: Secondary | ICD-10-CM | POA: Diagnosis present

## 2015-09-15 DIAGNOSIS — Z9841 Cataract extraction status, right eye: Secondary | ICD-10-CM

## 2015-09-15 DIAGNOSIS — Z9889 Other specified postprocedural states: Secondary | ICD-10-CM | POA: Diagnosis not present

## 2015-09-15 DIAGNOSIS — M21371 Foot drop, right foot: Secondary | ICD-10-CM | POA: Diagnosis present

## 2015-09-15 DIAGNOSIS — R609 Edema, unspecified: Secondary | ICD-10-CM | POA: Diagnosis not present

## 2015-09-15 DIAGNOSIS — M4714 Other spondylosis with myelopathy, thoracic region: Secondary | ICD-10-CM | POA: Diagnosis present

## 2015-09-15 DIAGNOSIS — I4891 Unspecified atrial fibrillation: Secondary | ICD-10-CM | POA: Diagnosis present

## 2015-09-15 DIAGNOSIS — I481 Persistent atrial fibrillation: Secondary | ICD-10-CM | POA: Diagnosis not present

## 2015-09-15 DIAGNOSIS — Z981 Arthrodesis status: Secondary | ICD-10-CM | POA: Diagnosis not present

## 2015-09-15 DIAGNOSIS — Z7901 Long term (current) use of anticoagulants: Secondary | ICD-10-CM | POA: Diagnosis not present

## 2015-09-15 DIAGNOSIS — I480 Paroxysmal atrial fibrillation: Secondary | ICD-10-CM | POA: Diagnosis not present

## 2015-09-15 DIAGNOSIS — M5124 Other intervertebral disc displacement, thoracic region: Secondary | ICD-10-CM | POA: Diagnosis not present

## 2015-09-15 DIAGNOSIS — R509 Fever, unspecified: Secondary | ICD-10-CM

## 2015-09-15 DIAGNOSIS — M6281 Muscle weakness (generalized): Secondary | ICD-10-CM | POA: Diagnosis not present

## 2015-09-15 DIAGNOSIS — G9529 Other cord compression: Secondary | ICD-10-CM | POA: Diagnosis not present

## 2015-09-15 DIAGNOSIS — M4807 Spinal stenosis, lumbosacral region: Secondary | ICD-10-CM | POA: Diagnosis not present

## 2015-09-15 DIAGNOSIS — I248 Other forms of acute ischemic heart disease: Secondary | ICD-10-CM | POA: Diagnosis not present

## 2015-09-15 DIAGNOSIS — Z9842 Cataract extraction status, left eye: Secondary | ICD-10-CM | POA: Diagnosis not present

## 2015-09-15 DIAGNOSIS — N390 Urinary tract infection, site not specified: Secondary | ICD-10-CM | POA: Diagnosis present

## 2015-09-15 DIAGNOSIS — R32 Unspecified urinary incontinence: Secondary | ICD-10-CM | POA: Diagnosis present

## 2015-09-15 DIAGNOSIS — G9520 Unspecified cord compression: Secondary | ICD-10-CM | POA: Diagnosis not present

## 2015-09-15 DIAGNOSIS — Z96643 Presence of artificial hip joint, bilateral: Secondary | ICD-10-CM | POA: Diagnosis present

## 2015-09-15 DIAGNOSIS — R531 Weakness: Secondary | ICD-10-CM | POA: Diagnosis not present

## 2015-09-15 DIAGNOSIS — M542 Cervicalgia: Secondary | ICD-10-CM

## 2015-09-15 DIAGNOSIS — Z96649 Presence of unspecified artificial hip joint: Secondary | ICD-10-CM | POA: Diagnosis present

## 2015-09-15 DIAGNOSIS — R29898 Other symptoms and signs involving the musculoskeletal system: Secondary | ICD-10-CM

## 2015-09-15 DIAGNOSIS — M47814 Spondylosis without myelopathy or radiculopathy, thoracic region: Secondary | ICD-10-CM | POA: Diagnosis not present

## 2015-09-15 DIAGNOSIS — M549 Dorsalgia, unspecified: Secondary | ICD-10-CM | POA: Diagnosis not present

## 2015-09-15 DIAGNOSIS — M4804 Spinal stenosis, thoracic region: Principal | ICD-10-CM | POA: Diagnosis present

## 2015-09-15 DIAGNOSIS — I10 Essential (primary) hypertension: Secondary | ICD-10-CM | POA: Diagnosis present

## 2015-09-15 DIAGNOSIS — M5136 Other intervertebral disc degeneration, lumbar region: Secondary | ICD-10-CM | POA: Diagnosis present

## 2015-09-15 DIAGNOSIS — M1990 Unspecified osteoarthritis, unspecified site: Secondary | ICD-10-CM | POA: Diagnosis present

## 2015-09-15 DIAGNOSIS — Z6841 Body Mass Index (BMI) 40.0 and over, adult: Secondary | ICD-10-CM | POA: Diagnosis not present

## 2015-09-15 DIAGNOSIS — G959 Disease of spinal cord, unspecified: Secondary | ICD-10-CM | POA: Diagnosis not present

## 2015-09-15 DIAGNOSIS — M5104 Intervertebral disc disorders with myelopathy, thoracic region: Secondary | ICD-10-CM | POA: Diagnosis not present

## 2015-09-15 DIAGNOSIS — Z23 Encounter for immunization: Secondary | ICD-10-CM | POA: Diagnosis not present

## 2015-09-15 DIAGNOSIS — I482 Chronic atrial fibrillation: Secondary | ICD-10-CM | POA: Diagnosis not present

## 2015-09-15 DIAGNOSIS — M79606 Pain in leg, unspecified: Secondary | ICD-10-CM | POA: Diagnosis not present

## 2015-09-15 DIAGNOSIS — Z794 Long term (current) use of insulin: Secondary | ICD-10-CM

## 2015-09-15 DIAGNOSIS — E119 Type 2 diabetes mellitus without complications: Secondary | ICD-10-CM | POA: Diagnosis present

## 2015-09-15 DIAGNOSIS — Z87891 Personal history of nicotine dependence: Secondary | ICD-10-CM | POA: Diagnosis not present

## 2015-09-15 DIAGNOSIS — M4806 Spinal stenosis, lumbar region: Secondary | ICD-10-CM | POA: Diagnosis not present

## 2015-09-15 DIAGNOSIS — M4326 Fusion of spine, lumbar region: Secondary | ICD-10-CM | POA: Diagnosis not present

## 2015-09-15 DIAGNOSIS — M47011 Anterior spinal artery compression syndromes, occipito-atlanto-axial region: Secondary | ICD-10-CM | POA: Diagnosis not present

## 2015-09-15 DIAGNOSIS — M79604 Pain in right leg: Secondary | ICD-10-CM | POA: Diagnosis not present

## 2015-09-15 DIAGNOSIS — M546 Pain in thoracic spine: Secondary | ICD-10-CM | POA: Diagnosis not present

## 2015-09-15 DIAGNOSIS — M5134 Other intervertebral disc degeneration, thoracic region: Secondary | ICD-10-CM | POA: Diagnosis not present

## 2015-09-15 LAB — APTT: aPTT: 43 seconds — ABNORMAL HIGH (ref 24–37)

## 2015-09-15 LAB — CBC WITH DIFFERENTIAL/PLATELET
Basophils Absolute: 0 10*3/uL (ref 0.0–0.1)
Basophils Relative: 0 %
Eosinophils Absolute: 0.2 10*3/uL (ref 0.0–0.7)
Eosinophils Relative: 2 %
HCT: 31.2 % — ABNORMAL LOW (ref 36.0–46.0)
Hemoglobin: 10.3 g/dL — ABNORMAL LOW (ref 12.0–15.0)
Lymphocytes Relative: 16 %
Lymphs Abs: 1.7 10*3/uL (ref 0.7–4.0)
MCH: 30.6 pg (ref 26.0–34.0)
MCHC: 33 g/dL (ref 30.0–36.0)
MCV: 92.6 fL (ref 78.0–100.0)
Monocytes Absolute: 0.9 10*3/uL (ref 0.1–1.0)
Monocytes Relative: 8 %
Neutro Abs: 7.6 10*3/uL (ref 1.7–7.7)
Neutrophils Relative %: 74 %
Platelets: 303 10*3/uL (ref 150–400)
RBC: 3.37 MIL/uL — ABNORMAL LOW (ref 3.87–5.11)
RDW: 15.9 % — ABNORMAL HIGH (ref 11.5–15.5)
WBC: 10.3 10*3/uL (ref 4.0–10.5)

## 2015-09-15 LAB — COMPREHENSIVE METABOLIC PANEL
ALT: 19 U/L (ref 14–54)
AST: 27 U/L (ref 15–41)
Albumin: 3.2 g/dL — ABNORMAL LOW (ref 3.5–5.0)
Alkaline Phosphatase: 54 U/L (ref 38–126)
Anion gap: 11 (ref 5–15)
BUN: 50 mg/dL — ABNORMAL HIGH (ref 6–20)
CO2: 28 mmol/L (ref 22–32)
Calcium: 8.8 mg/dL — ABNORMAL LOW (ref 8.9–10.3)
Chloride: 104 mmol/L (ref 101–111)
Creatinine, Ser: 1.8 mg/dL — ABNORMAL HIGH (ref 0.44–1.00)
GFR calc Af Amer: 30 mL/min — ABNORMAL LOW (ref >60)
GFR calc non Af Amer: 26 mL/min — ABNORMAL LOW (ref >60)
Glucose, Bld: 132 mg/dL — ABNORMAL HIGH (ref 65–99)
Potassium: 3.7 mmol/L (ref 3.5–5.1)
Sodium: 143 mmol/L (ref 135–145)
Total Bilirubin: 2.4 mg/dL — ABNORMAL HIGH (ref 0.3–1.2)
Total Protein: 6.4 g/dL — ABNORMAL LOW (ref 6.5–8.1)

## 2015-09-15 LAB — PROTIME-INR
INR: 2.09 — ABNORMAL HIGH (ref 0.00–1.49)
Prothrombin Time: 23.4 seconds — ABNORMAL HIGH (ref 11.6–15.2)

## 2015-09-15 MED ORDER — ACETAMINOPHEN 650 MG RE SUPP: 650.0000 mg | Freq: Four times a day (QID) | RECTAL | Status: DC | PRN

## 2015-09-15 MED ORDER — OXYCODONE-ACETAMINOPHEN 5-325 MG PO TABS
1.0000 | ORAL_TABLET | ORAL | Status: DC | PRN
  Administered 2015-09-18: 2 via ORAL
  Filled 2015-09-15 (×4): qty 2

## 2015-09-15 MED ORDER — B-12 1000 MCG PO CAPS: 1200.0000 ug | ORAL_CAPSULE | ORAL | Status: DC

## 2015-09-15 MED ORDER — ONDANSETRON HCL 4 MG/2ML IJ SOLN: 4.0000 mg | Freq: Four times a day (QID) | INTRAMUSCULAR | Status: DC | PRN

## 2015-09-15 MED ORDER — SODIUM CHLORIDE 0.9 % IV SOLN: 250.0000 mL | INTRAVENOUS | Status: DC | PRN

## 2015-09-15 MED ORDER — HYDROMORPHONE HCL 1 MG/ML IJ SOLN
0.5000 mg | INTRAMUSCULAR | Status: DC | PRN
  Filled 2015-09-15 (×3): qty 1

## 2015-09-15 MED ORDER — DOCUSATE SODIUM 100 MG PO CAPS
100.0000 mg | ORAL_CAPSULE | Freq: Two times a day (BID) | ORAL | Status: DC
  Administered 2015-09-15 – 2015-09-23 (×14): 100 mg via ORAL
  Filled 2015-09-15 (×11): qty 1

## 2015-09-15 MED ORDER — CYCLOBENZAPRINE HCL 5 MG PO TABS: 5.0000 mg | ORAL_TABLET | Freq: Two times a day (BID) | ORAL | Status: DC | PRN

## 2015-09-15 MED ORDER — HYDROCODONE-ACETAMINOPHEN 5-325 MG PO TABS: 1.0000 | ORAL_TABLET | ORAL | Status: DC | PRN

## 2015-09-15 MED ORDER — WARFARIN - PHYSICIAN DOSING INPATIENT: Freq: Every day | Status: DC

## 2015-09-15 MED ORDER — ACETAMINOPHEN 325 MG PO TABS: 650.0000 mg | ORAL_TABLET | Freq: Four times a day (QID) | ORAL | Status: DC | PRN

## 2015-09-15 MED ORDER — SODIUM CHLORIDE 0.9% FLUSH
3.0000 mL | Freq: Two times a day (BID) | INTRAVENOUS | Status: DC
  Administered 2015-09-15 – 2015-09-23 (×7): 3 mL via INTRAVENOUS

## 2015-09-15 MED ORDER — WARFARIN SODIUM 3 MG PO TABS
3.0000 mg | ORAL_TABLET | ORAL | Status: DC
  Administered 2015-09-16: 3 mg via ORAL
  Filled 2015-09-15: qty 1

## 2015-09-15 MED ORDER — FLEET ENEMA 7-19 GM/118ML RE ENEM: 1.0000 | ENEMA | Freq: Once | RECTAL | Status: DC | PRN

## 2015-09-15 MED ORDER — LOSARTAN POTASSIUM 50 MG PO TABS
100.0000 mg | ORAL_TABLET | Freq: Every day | ORAL | Status: DC
  Administered 2015-09-16 – 2015-09-23 (×6): 100 mg via ORAL
  Filled 2015-09-15 (×7): qty 2

## 2015-09-15 MED ORDER — WARFARIN SODIUM 2 MG PO TABS
2.0000 mg | ORAL_TABLET | ORAL | Status: DC
  Administered 2015-09-15: 2 mg via ORAL
  Filled 2015-09-15 (×2): qty 1

## 2015-09-15 MED ORDER — BISACODYL 10 MG RE SUPP
10.0000 mg | Freq: Every day | RECTAL | Status: DC | PRN
  Administered 2015-09-16: 10 mg via RECTAL
  Filled 2015-09-15: qty 1

## 2015-09-15 MED ORDER — SODIUM CHLORIDE 0.9% FLUSH: 3.0000 mL | INTRAVENOUS | Status: DC | PRN

## 2015-09-15 MED ORDER — FISH OIL 1200 MG PO CAPS
1.0000 | ORAL_CAPSULE | ORAL | Status: DC
Start: 2015-09-15 — End: 2015-09-15

## 2015-09-15 MED ORDER — ROSUVASTATIN CALCIUM 10 MG PO TABS
10.0000 mg | ORAL_TABLET | ORAL | Status: DC
  Administered 2015-09-15 – 2015-09-22 (×4): 10 mg via ORAL
  Filled 2015-09-15 (×6): qty 1

## 2015-09-15 MED ORDER — OXYBUTYNIN CHLORIDE 5 MG PO TABS
5.0000 mg | ORAL_TABLET | Freq: Two times a day (BID) | ORAL | Status: DC
  Administered 2015-09-15 – 2015-09-23 (×15): 5 mg via ORAL
  Filled 2015-09-15 (×16): qty 1

## 2015-09-15 MED ORDER — ONDANSETRON HCL 4 MG PO TABS: 4.0000 mg | ORAL_TABLET | Freq: Four times a day (QID) | ORAL | Status: DC | PRN

## 2015-09-15 MED ORDER — POLYETHYLENE GLYCOL 3350 17 G PO PACK
17.0000 g | PACK | Freq: Every day | ORAL | Status: DC | PRN
  Administered 2015-09-16: 17 g via ORAL
  Filled 2015-09-15: qty 1

## 2015-09-15 MED ORDER — CYCLOBENZAPRINE HCL 10 MG PO TABS
10.0000 mg | ORAL_TABLET | Freq: Every day | ORAL | Status: DC
  Administered 2015-09-15 – 2015-09-22 (×8): 10 mg via ORAL
  Filled 2015-09-15 (×9): qty 1

## 2015-09-15 MED ORDER — BISOPROLOL-HYDROCHLOROTHIAZIDE 10-6.25 MG PO TABS
1.0000 | ORAL_TABLET | Freq: Two times a day (BID) | ORAL | Status: DC
  Administered 2015-09-15 – 2015-09-23 (×13): 1 via ORAL
  Filled 2015-09-15 (×22): qty 1

## 2015-09-15 MED ORDER — VITAMIN D 1000 UNITS PO TABS
2000.0000 [IU] | ORAL_TABLET | Freq: Every day | ORAL | Status: DC
  Administered 2015-09-15 – 2015-09-23 (×8): 2000 [IU] via ORAL
  Filled 2015-09-15 (×8): qty 2

## 2015-09-15 MED ORDER — BISOPROLOL-HYDROCHLOROTHIAZIDE 10-6.25 MG PO TABS: 1.0000 | ORAL_TABLET | Freq: Two times a day (BID) | ORAL | Status: DC

## 2015-09-15 MED ORDER — SODIUM CHLORIDE 0.9 % IV SOLN
INTRAVENOUS | Status: DC
  Administered 2015-09-15 – 2015-09-19 (×5): via INTRAVENOUS

## 2015-09-15 NOTE — H&P (Signed)
[name] [name] is an 82 y.o. female.   Chief Complaint: right leg pain and weakness HPI: Patient presented with the chief complaint of right leg pain and weakness. She was seen in the office two weeks ago with bilateral leg weakness. She reported that she had been fatigued because she had the flu. She did not have any neurological deficits when seen at the office 2 weeks ago. She was started on home PT but was unable to participate in more than one visit because of acute right leg pain. She reports that she went to the ED where they took x-rays of her pelvis and knees, showing no bony abnormalities or complications with her THAs and TKAs. Since being sent back home she has been unable to care for herself in anyway because of the pain and weakness. She presents with severe right leg pain and weakness with acute neuro deficits on exam. She has a history of a lumbar fusion in the 1980s by Dr. Agapito Games at [facility] spine clinic.   Past Medical History  Diagnosis Date  . Atrial fibrillation ([place])   . Hypertension   . Hyperlipidemia   . Osteoarthritis   . Diabetes ([place])     prediabetes    Past Surgical History  Procedure Laterality Date  . Wisdom tooth extraction    . Tonsillectomy    . Neck surgery    . Back surgery    . Knee surgery    . Total hip arthroplasty    . Cataract extraction  Apr 11, 2012, Jun 04 2012  . Carpal tunnel release Right     Family History  Problem Relation Age of Onset  . Heart disease Mother   . Colon cancer Father     dx in his late 2's, died age 82  . Uterine cancer Sister 51    spread to colon   Social History:  reports that she has quit smoking. Her smoking use included Cigarettes. She quit after 20 years of use. She has never used smokeless tobacco. She reports that she drinks alcohol. She reports that she does not use illicit drugs.  Allergies:  Allergies  Allergen Reactions  . Nsaids     On coumadin  . Statins     Cause leg cramps       Current outpatient prescriptions:  .  bisoprolol-hydrochlorothiazide (ZIAC) 10-6.25 MG per tablet, Take 1 tablet by mouth 2 (two) times daily. , Disp: , Rfl:  .  Cholecalciferol (VITAMIN D) 2000 UNITS tablet, Take 2,000 Units by mouth every other day., Disp: , Rfl:  .  Cyanocobalamin (B-12 PO), Take 1,200 mcg by mouth every other day. Reported on 09/12/2015, Disp: , Rfl:  .  cyclobenzaprine (FLEXERIL) 5 MG tablet, Take 1 tablet (5 mg total) by mouth 2 (two) times daily as needed for muscle spasms., Disp: 15 tablet, Rfl: 0 .  HYDROcodone-acetaminophen (NORCO) 5-325 MG tablet, Take 1 tablet by mouth every 4 (four) hours as needed., Disp: 10 tablet, Rfl: 0 .  lipase/protease/amylase (CREON) 12000 UNITS CPEP capsule, Take 2 tablets with each meal 3 times a day (Patient not taking: Reported on 09/10/2015), Disp: 540 capsule, Rfl: 1 .  losartan (COZAAR) 100 MG tablet, Take 100 mg by mouth daily., Disp: , Rfl:  .  Omega-3 Fatty Acids (FISH OIL) 1200 MG CAPS, Take 1 capsule by mouth See admin instructions. Reported on 09/12/2015, Disp: , Rfl:  .  oxybutynin (DITROPAN) 5 MG tablet, Take 5 mg by mouth See admin  instructions. 2-3 times daily, Disp: , Rfl:  .  ranitidine (ZANTAC) 75 MG tablet, Take 75 mg by mouth daily as needed for heartburn., Disp: , Rfl:  .  rosuvastatin (CRESTOR) 10 MG tablet, Take 10 mg by mouth every Monday, Wednesday, and Friday. m w f at bedtime, Disp: , Rfl:  .  warfarin (COUMADIN) 2 MG tablet, Take 2 mg by mouth See admin instructions. Tuesday Thursday Saturday Sunday, Disp: , Rfl:  .  warfarin (COUMADIN) 3 MG tablet, Take 3 mg by mouth every Monday, Wednesday, and Friday. , Disp: , Rfl:  .  Wheat Dextrin (BENEFIBER) POWD, Mix 1 heaping teaspoon in water or juice daily, Disp: 236 g, Rfl: 0  Review of Systems  Constitutional: Positive for malaise/fatigue. Negative for fever, chills, weight loss and diaphoresis.  HENT: Negative.   Eyes: Negative.   Respiratory: Negative.    Cardiovascular: Negative.   Gastrointestinal: Negative.   Genitourinary: Negative.   Musculoskeletal: Positive for myalgias, back pain and joint pain. Negative for falls and neck pain.  Skin: Negative.   Neurological: Positive for focal weakness and weakness. Negative for dizziness, tingling, tremors, sensory change, speech change, seizures and loss of consciousness.  Endo/Heme/Allergies: Negative.   Psychiatric/Behavioral: Negative.     Physical Exam  Constitutional: She is oriented to person, place, and time. She appears well-developed. No distress.  Morbidly obese  HENT:  Head: Normocephalic and atraumatic.  Right Ear: External ear normal.  Left Ear: External ear normal.  Nose: Nose normal.  Mouth/Throat: Oropharynx is clear and moist.  Eyes: Conjunctivae and EOM are normal.  Neck: Normal range of motion. Neck supple.  Cardiovascular: Normal rate, normal heart sounds and intact distal pulses.  An irregularly irregular rhythm present.  No murmur heard. Respiratory: Effort normal and breath sounds normal. No respiratory distress. She has no wheezes.  GI: Soft. Bowel sounds are normal. She exhibits no distension. There is no tenderness.  Musculoskeletal:       Right hip: She exhibits decreased strength.       Left hip: Normal.       Right knee: She exhibits no erythema. No tenderness found.       Left knee: Normal.       Right foot: There is decreased range of motion. There is no tenderness.       Left foot: Normal.  Neurological: She is alert and oriented to person, place, and time. No sensory deficit.  1/5 right foot EHL 2/5 right quads and hip flexors 5/5 left LE  Skin: She is not diaphoretic.     Assessment/Plan Right leg pain and right foot drop with history of lumbar DDD and lumbar fusion We are going to get her admitted for evaluation and some tests. She needs full x-rays of her lumbar spine as well as an MRI of her lumbar spine. She is unable to have a CT myelogram  at this time because she is on coumadin. She will b transferred to [facility] via ambulance.   Javaria Knapke LAUREN, PA-C 09/15/2015, 3:28 PM

## 2015-09-15 NOTE — Progress Notes (Signed)
Pt  Off floor for xray,  X ray tech came to get patient, pt maintained in bed informed tech of generalized weakness B/L Le's do not let pt stand alone. Must be assisted at all times.

## 2015-09-16 DIAGNOSIS — M21371 Foot drop, right foot: Secondary | ICD-10-CM | POA: Diagnosis present

## 2015-09-16 LAB — CBC
HCT: 31.8 % — ABNORMAL LOW (ref 36.0–46.0)
Hemoglobin: 10 g/dL — ABNORMAL LOW (ref 12.0–15.0)
MCH: 30.6 pg (ref 26.0–34.0)
MCHC: 31.4 g/dL (ref 30.0–36.0)
MCV: 97.2 fL (ref 78.0–100.0)
Platelets: 309 10*3/uL (ref 150–400)
RBC: 3.27 MIL/uL — ABNORMAL LOW (ref 3.87–5.11)
RDW: 16.1 % — ABNORMAL HIGH (ref 11.5–15.5)
WBC: 9.8 10*3/uL (ref 4.0–10.5)

## 2015-09-16 LAB — COMPREHENSIVE METABOLIC PANEL
ALT: 17 U/L (ref 14–54)
AST: 23 U/L (ref 15–41)
Albumin: 3 g/dL — ABNORMAL LOW (ref 3.5–5.0)
Alkaline Phosphatase: 52 U/L (ref 38–126)
Anion gap: 10 (ref 5–15)
BUN: 49 mg/dL — ABNORMAL HIGH (ref 6–20)
CO2: 28 mmol/L (ref 22–32)
Calcium: 8.6 mg/dL — ABNORMAL LOW (ref 8.9–10.3)
Chloride: 102 mmol/L (ref 101–111)
Creatinine, Ser: 1.61 mg/dL — ABNORMAL HIGH (ref 0.44–1.00)
GFR calc Af Amer: 34 mL/min — ABNORMAL LOW (ref >60)
GFR calc non Af Amer: 29 mL/min — ABNORMAL LOW (ref >60)
Glucose, Bld: 153 mg/dL — ABNORMAL HIGH (ref 65–99)
Potassium: 4.8 mmol/L (ref 3.5–5.1)
Sodium: 140 mmol/L (ref 135–145)
Total Bilirubin: 2.5 mg/dL — ABNORMAL HIGH (ref 0.3–1.2)
Total Protein: 6.2 g/dL — ABNORMAL LOW (ref 6.5–8.1)

## 2015-09-16 LAB — PROTIME-INR
INR: 2.11 — ABNORMAL HIGH (ref 0.00–1.49)
PROTHROMBIN TIME: 23.5 s — AB (ref 11.6–15.2)

## 2015-09-16 LAB — APTT: aPTT: 46 seconds — ABNORMAL HIGH (ref 24–37)

## 2015-09-16 NOTE — Care Management Note (Signed)
Case Management Note  Patient Details  Name: [name] [name] MRN: [id number] Date of Birth: Apr 16, 1934  Subjective/Objective:                  right leg pain and weakness Action/Plan: Discharge planning Expected Discharge Date:   (unknown)               Expected Discharge Plan:  [place]  In-House Referral:     Discharge planning Services  CM Consult  Post Acute Care Choice:    Choice offered to:  Patient  DME Arranged:    DME Agency:     HH Arranged:    [place] Agency:     Status of Service:  Completed, signed off  Medicare Important Message Given:    Date Medicare IM Given:    Medicare IM give by:    Date Additional Medicare IM Given:    Additional Medicare Important Message give by:     If discussed at [place] of Stay Meetings, dates discussed:    Additional Comments: CM notes and confirms with pt, plan is for SNF; CSW arranging.  No other CM needs were communicated. Dellie Catholic, RN 09/16/2015, 2:10 PM

## 2015-09-16 NOTE — Progress Notes (Addendum)
Nutrition Brief Note  Patient identified on the Malnutrition Screening Tool (MST) Report  Wt Readings from Last 15 Encounters:  09/15/15 239 lb (108.41 kg)  02/20/15 239 lb (108.41 kg)  12/31/14 239 lb 9.6 oz (108.682 kg)  07/22/14 238 lb (107.956 kg)  02/20/13 246 lb 4 oz (111.698 kg)  10/23/12 247 lb (112.038 kg)  05/05/12 265 lb 8 oz (120.43 kg)    Pt reports 15 lb weight loss (would indicate starting weight of 254 lbs indicating 6% body weight loss) which is not significant for time frame. Weight hx indicates that eight has been stable x14 months.   Current diet order is Heart Healthy/Carb Modified, patient is consuming approximately 100% of meals at this time. Labs and medications reviewed (2000 IU vitamin D/day).   No nutrition interventions warranted at this time. If nutrition issues arise, please consult RD.      Jarome Matin, RD, LDN Inpatient Clinical Dietitian Pager # [phone] After hours/weekend pager # [phone]    ADDENDUM: BMI hand calculated and indicates BMI of 43.71 kg/m2 which indicates morbid obesity.

## 2015-09-16 NOTE — Care Management Obs Status (Signed)
[place] NOTIFICATION   Patient Details  Name: [name] [name] MRN: [id number] Date of Birth: Jan 16, 1934   Medicare Observation Status Notification Given:  Yes [place], [place]; copy to pt; original to CMA to scan into medical chart. Dellie Catholic, RN 09/16/2015, 2:09 PM

## 2015-09-16 NOTE — Clinical Social Work Note (Signed)
Clinical Social Work Assessment  Patient Details  Name: [name] [name] MRN: [id number] Date of Birth: 1934-03-31  Date of referral:  09/16/15               Reason for consult:  Facility Placement, Discharge Planning                Permission sought to share information with:  Chartered certified accountant granted to share information::  Yes, Verbal Permission Granted  Name::        Agency::     Relationship::     Contact Information:     Housing/Transportation Living arrangements for the past 2 months:  Single Family Home Source of Information:  Patient Patient Interpreter Needed:  None Criminal Activity/Legal Involvement Pertinent to Current Situation/Hospitalization:  No - Comment as needed Significant Relationships:  Adult Children Lives with:  Self Do you feel safe going back to the place where you live?  No (SNF needed.) Need for family participation in patient care:     Care giving concerns:  Pt's care cannot be managed at home following hospital d/c.   Social Worker assessment / plan:  Pt hospitalized on 09/15/15 under OBS for right leg pain and right foot drop. PT has recommended SNF placement. CSW has met with pt to assist wit d/c planning. Pt agrees with plan for SNF. She is aware tat medicare will not cover costs of placement. SNF search as been initiated and pt has chosen [facility]. CSW will continue to follow to assist with d/c planning to SNF.  Employment status:  Retired Forensic scientist:  Medicare PT Recommendations:  [place] / Referral to community resources:  [place]  Patient/Family's Response to care:  Pt feels SNF is needed. Pt will talk to her son tonite regarding rehab plans.  Patient/Family's Understanding of and Emotional Response to Diagnosis, Current Treatment, and Prognosis:  " I can't walk. I don't know why I can't walk. I know I can't go home by myself like this. " Support  provided.  Emotional Assessment Appearance:  Appears stated age Attitude/Demeanor/Rapport:  Other (cooperative) Affect (typically observed):  Appropriate Orientation:  Oriented to Self, Oriented to Place, Oriented to  Time, Oriented to Situation Alcohol / Substance use:  Not Applicable Psych involvement (Current and /or in the community):  No (Comment)  Discharge Needs  Concerns to be addressed:  Discharge Planning Concerns Readmission within the last 30 days:  No Current discharge risk:  None Barriers to Discharge:  No Barriers Identified   Loraine Maple  [phone] 09/16/2015, 2:55 PM

## 2015-09-16 NOTE — Progress Notes (Signed)
Subjective:     Patient reports pain as 3 on 0-10 scale. Doing better this morning. She is moving her Right Foot much better although her motor strength is decreased. I reviewed her MRI and she has some Foraminal Stenosis at L-5-S-1. She certainly doe not need surgery.Our plan is to have PTambulate her and transfer her to SNF since she lives alone at home.She has a long history of incontinence since her Lumbar Fusion at [facility] years ago.She has Renal issues as well.She is on Coumadin also.   Objective: Vital signs in last 24 hours: Temp:  [97.8 F (36.6 C)-98.6 F (37 C)] 97.8 F (36.6 C) (03/21 0509) Pulse Rate:  [83-105] 98 (03/21 0509) Resp:  [16] 16 (03/21 0509) BP: (100-125)/(48-66) 100/50 mmHg (03/21 0509) SpO2:  [97 %-98 %] 98 % (03/21 0509) Weight:  [108.41 kg (239 lb)] 108.41 kg (239 lb) (03/20 2048)  Intake/Output from previous day: 03/20 0701 - 03/21 0700 In: 588.8 [P.O.:480; I.V.:108.8] Out: -  Intake/Output this shift:     Recent Labs  09/15/15 1638 09/16/15 0433  HGB 10.3* 10.0*    Recent Labs  09/15/15 1638 09/16/15 0433  WBC 10.3 9.8  RBC 3.37* 3.27*  HCT 31.2* 31.8*  PLT 303 309    Recent Labs  09/15/15 1638 09/16/15 0433  NA 143 140  K 3.7 4.8  CL 104 102  CO2 28 28  BUN 50* 49*  CREATININE 1.80* 1.61*  GLUCOSE 132* 153*  CALCIUM 8.8* 8.6*    Recent Labs  09/15/15 1638 09/16/15 0433  INR 2.09* 2.11*    Weakness of her right foot dorsiflexors on the right.  Assessment/Plan:     Up with therapy Discharge to SNF when improved.  [name] [name] A 09/16/2015, 7:10 AM

## 2015-09-16 NOTE — Evaluation (Addendum)
Physical Therapy Evaluation Patient Details Name: [name] [name] MRN: [id number] DOB: 1934/01/01 Today's Date: 09/16/2015   History of Present Illness  Patient presented with the chief complaint of right leg pain and weakness. She was seen in the office two weeks ago with bilateral leg weakness. She reported that she had been fatigued because she had the flu. She did not have any neurological deficits when seen at the office 2 weeks ago. She was started on home PT but was unable to participate in more than one visit because of acute right leg pain. She reports that she went to the ED where they took x-rays of her pelvis and knees, showing no bony abnormalities or complications with her THAs and TKAs. Since being sent back home she has been unable to care for herself in anyway because of the pain and weakness. She presents with severe right leg pain and weakness with acute neuro deficits on exam. She has a history of a lumbar fusion MRI does not indicate any extensive nerve impingement  Clinical Impression  The patient presents with significant/profound  LE and trunk weakness, unable to sit at bed edge without  Upper body support. Patient presents with a paraparesis -like weakness and  Impaired sensation. Patient reports loss of bladder control from "dribbling" more recent and no BM for 1 week. Patient currently will require a mechanical lift for out of bed activity into a WC/recliner. Patient also reports sensory changes in  Lower trunk and legs.  Pt admitted with above diagnosis. Pt currently with functional limitations due to the deficits listed below (see PT Problem List).  Pt will benefit from skilled PT to increase their independence and safety with mobility to allow discharge to the venue listed below.       Follow Up Recommendations SNF;Supervision/Assistance - 24 hour    Equipment Recommendations  None recommended by PT    Recommendations for Other Services   OT consult    Precautions /  Restrictions Precautions Precautions: Fall Precaution Comments: WILL NEED SLIDING BOARD OR MECHANICAL LIFT, LEGS AND TRUNK PROFOUNDLY WEAK.      Mobility  Bed Mobility Overal bed mobility: Needs Assistance;+2 for physical assistance;+ 2 for safety/equipment Bed Mobility: Supine to Sit;Sit to Supine     Supine to sit: HOB elevated;Total assist Sit to supine: Total assist   General bed mobility comments: total assist to move the legs to edge of bed and to get trunk into upright. Bed pad utilied. total assist to get legs onto bed. patient does assist to lower trunk with Upper body.  Transfers                 General transfer comment: unable due to profound leg weakness.  Ambulation/Gait                Stairs            Wheelchair Mobility    Modified Rankin (Stroke Patients Only)       Balance Overall balance assessment: Needs assistance Sitting-balance support: Bilateral upper extremity supported;Feet supported Sitting balance-Leahy Scale: Poor Sitting balance - Comments: Zero if no UE support. trunk is unstable                                     Pertinent Vitals/Pain Pain Assessment: 0-10 Pain Score: 3  Pain Location: back Pain Descriptors / Indicators: Aching Pain Intervention(s): Monitored during session  Home Living Family/patient expects to be discharged to:: Skilled nursing facility Living Arrangements: Alone                    Prior Function           Comments: WAS AMBULATORY ABOUT 1 WEEK AGO. UNABLE TO STAND IN LAST WEEK.     Hand Dominance        Extremity/Trunk Assessment   Upper Extremity Assessment: Overall WFL for tasks assessed           Lower Extremity Assessment: RLE deficits/detail;LLE deficits/detail RLE Deficits / Details: knee ext 2- in sitting, dorsiflexion1+, abductionis 2 LLE Deficits / Details: has h/o patella mafunction, unable to activate quad to raise leg nor to extend the  knee when the knee is flexed up in bed., dorsiflexion is 2+, plantar flexion is 2  Cervical / Trunk Assessment: Other exceptions  Communication   Communication: No difficulties  Cognition Arousal/Alertness: Awake/alert Behavior During Therapy: WFL for tasks assessed/performed Overall Cognitive Status: Within Functional Limits for tasks assessed                      General Comments      Exercises        Assessment/Plan    PT Assessment Patient needs continued PT services  PT Diagnosis Difficulty walking;Generalized weakness;Acute pain;Other (comment) (paraparesis-like weakness.)   PT Problem List Decreased strength;Decreased range of motion;Decreased activity tolerance;Decreased balance;Decreased mobility;Impaired sensation;Decreased knowledge of precautions;Decreased safety awareness;Decreased knowledge of use of DME;Pain  PT Treatment Interventions Functional mobility training;Therapeutic activities;Therapeutic exercise;Neuromuscular re-education;Patient/family education;Wheelchair mobility training   PT Goals (Current goals can be found in the Care Plan section) Acute Rehab PT Goals Patient Stated Goal: to know what is wrong with my legs PT Goal Formulation: With patient Time For Goal Achievement: 09/30/15 Potential to Achieve Goals: Fair    Frequency Min 3X/week   Barriers to discharge Decreased caregiver support      Co-evaluation               End of Session   Activity Tolerance: Patient tolerated treatment well Patient left: in bed;with call bell/phone within reach;with bed alarm set Nurse Communication: Mobility status;Need for lift equipment    Functional Assessment Tool Used: clinical judgement Functional Limitation: Changing and maintaining body position Mobility: Walking and Moving Around Current Status [id number]): 100 percent impaired, limited or restricted Mobility: Walking and Moving Around Goal Status [phone]): At least 20 percent but less  than 40 percent impaired, limited or restricted Changing and Maintaining Body Position Current Status [id number]): 100 percent impaired, limited or restricted Changing and Maintaining Body Position Goal Status [id number]): At least 20 percent but less than 40 percent impaired, limited or restricted    Time: 1217-1257 PT Time Calculation (min) (ACUTE ONLY): 40 min   Charges:   PT Evaluation $PT Eval Low Complexity: 1 Procedure PT Treatments $Therapeutic Activity: 8-22 mins $Neuromuscular Re-education: 8-22 mins   PT G Codes:   PT G-Codes **NOT FOR INPATIENT CLASS** Functional Assessment Tool Used: clinical judgement Functional Limitation: Changing and maintaining body position Mobility: Walking and Moving Around Current Status [id number]): 100 percent impaired, limited or restricted Mobility: Walking and Moving Around Goal Status [id number]): At least 20 percent but less than 40 percent impaired, limited or restricted Changing and Maintaining Body Position Current Status [id number]): 100 percent impaired, limited or restricted Changing and Maintaining Body Position Goal Status [id number]): At least 20 percent but less than 40 percent impaired, limited  or restricted    Claretha Cooper 09/16/2015, 2:38 PM Tresa Endo PT [phone]

## 2015-09-16 NOTE — Clinical Social Work Placement (Addendum)
   CLINICAL SOCIAL WORK PLACEMENT  NOTE  Date:  09/16/2015  Patient Details  Name: [name] [name] MRN: [id number] Date of Birth: Sep 12, 1933  Clinical Social Work is seeking post-discharge placement for this patient at the [place] level of care (*CSW will initial, date and re-position this form in  chart as items are completed):  Yes   Patient/family provided with [place] Work Department's list of facilities offering this level of care within the geographic area requested by the patient (or if unable, by the patient's family).  Yes   Patient/family informed of their freedom to choose among providers that offer the needed level of care, that participate in Medicare, Medicaid or managed care program needed by the patient, have an available bed and are willing to accept the patient.  Yes   Patient/family informed of [facility]'s ownership interest in [facility] and [facility], as well as of the fact that they are under no obligation to receive care at these facilities.  PASRR submitted to EDS on 09/16/15     PASRR number received on 09/16/15     Existing PASRR number confirmed on       FL2 transmitted to all facilities in geographic area requested by pt/family on 09/16/15     FL2 transmitted to all facilities within larger geographic area on       Patient informed that his/her managed care company has contracts with or will negotiate with certain facilities, including the following:        Yes   Patient/family informed of bed offers received.  Patient chooses bed at [facility]     Physician recommends and patient chooses bed at      Patient to be transferred to   on  .  Patient to be transferred to facility by  [facility]  (updated Evette Cristal, MSW, [place], 09-23-15)  Patient family notified on  09/23/15 of transfer.  (updated Evette Cristal, MSW, [place], 09-23-15)  Name of family member notified:   Patient notified her son [name]    (updated Evette Cristal, MSW, [place], 09-23-15)    PHYSICIAN       Additional Comment:    Jones Broom. [place], MSW, [place] 09/23/2015 1:19 PM  (updated Evette Cristal, MSW, [place], 09-23-15) _______________________________________________ Luretha Rued, [place]  [phone] 09/16/2015, 3:02 PM

## 2015-09-16 NOTE — NC FL2 (Addendum)
[place] MEDICAID FL2 LEVEL OF CARE SCREENING TOOL     IDENTIFICATION  Patient Name: [name] [name] Birthdate: [birth year] Sex: female Admission Date (Current Location): 09/15/2015  [facility] and [place] Number:  Herbalist and Address:  [facility],  [address], [place]      Provider Number: [id number]  Attending Physician Name and Address:  Latanya Maudlin, MD  Relative Name and Phone Number:       Current Level of Care: Hospital Recommended Level of Care: [place] Prior Approval Number:    Date Approved/Denied:   PASRR Number: [id number]  Discharge Plan: SNF    Current Diagnoses: Patient Active Problem List   Diagnosis Date Noted  . History of lumbar fusion 09/15/2015  . Chronic anticoagulation 07/22/2014  . Obesity 07/22/2014  . Hematuria 05/02/2012  . Atrial fibrillation ([place]) 05/02/2012  . Supratherapeutic INR 05/02/2012  . Coumadin toxicity 05/02/2012  . UTI (urinary tract infection) 05/02/2012  . HTN (hypertension) 05/02/2012  . Hyperlipidemia 05/02/2012  . Pedal edema 05/02/2012    Orientation RESPIRATION BLADDER Height & Weight     Self, Time, Situation, Place  Normal   Weight:   Height:     BEHAVIORAL SYMPTOMS/MOOD NEUROLOGICAL BOWEL NUTRITION STATUS  Other (Comment) (No Behaviors)   Incontinent Diet (heart healthy)  AMBULATORY STATUS COMMUNICATION OF NEEDS Skin : normal   Extensive Assist Verbally                       Personal Care Assistance Level of Assistance  Bathing, Feeding, Dressing Bathing Assistance: Limited assistance Feeding assistance: Independent Dressing Assistance: Maximum assistance     Functional Limitations Info  Sight, Hearing, Speech Sight Info: Adequate Hearing Info: Adequate Speech Info: Adequate    SPECIAL CARE FACTORS FREQUENCY  PT (By licensed PT), OT (By licensed OT)     PT Frequency: 5 x wk OT Frequency: 5 x  wk            Contractures  Contractures Info: Not present    Additional Factors Info  Code Status Code Status Info: Full Code             Current Medications (09/16/2015):  This is the current hospital active medication list Current Facility-Administered Medications  Medication Dose Route Frequency Provider Last Rate Last Dose  . 0.9 %  sodium chloride infusion  250 mL Intravenous PRN Amber Constable, PA-C      . 0.9 %  sodium chloride infusion   Intravenous Continuous Amber Constable, PA-C 75 mL/hr at 09/15/15 1855    . acetaminophen (TYLENOL) tablet 650 mg  650 mg Oral Q6H PRN Amber Constable, PA-C       Or  . acetaminophen (TYLENOL) suppository 650 mg  650 mg Rectal Q6H PRN Amber Constable, PA-C      . bisacodyl (DULCOLAX) suppository 10 mg  10 mg Rectal Daily PRN Amber Constable, PA-C      . bisoprolol-hydrochlorothiazide (ZIAC) 10-6.25 MG per tablet 1 tablet  1 tablet Oral BID Latanya Maudlin, MD   1 tablet at 09/16/15 0945  . cholecalciferol (VITAMIN D) tablet 2,000 Units  2,000 Units Oral Daily Amber Constable, PA-C   2,000 Units at 09/16/15 0945  . cyclobenzaprine (FLEXERIL) tablet 10 mg  10 mg Oral QHS Latanya Maudlin, MD   10 mg at 09/15/15 2145  . docusate sodium (COLACE) capsule 100 mg  100 mg Oral BID Amber Constable, PA-C   100 mg at  09/16/15 0945  . HYDROcodone-acetaminophen (NORCO/VICODIN) 5-325 MG per tablet 1 tablet  1 tablet Oral Q4H PRN [employer], PA-C      . HYDROmorphone (DILAUDID) injection 0.5-1 mg  0.5-1 mg Intravenous Q3H PRN Amber Constable, PA-C      . losartan (COZAAR) tablet 100 mg  100 mg Oral Daily [employer], PA-C   100 mg at 09/16/15 0945  . ondansetron (ZOFRAN) tablet 4 mg  4 mg Oral Q6H PRN Amber Constable, PA-C       Or  . ondansetron (ZOFRAN) injection 4 mg  4 mg Intravenous Q6H PRN Amber Constable, PA-C      . oxybutynin (DITROPAN) tablet 5 mg  5 mg Oral BID Amber Constable, PA-C   5 mg at 09/16/15 0945  . oxyCODONE-acetaminophen (PERCOCET/ROXICET) 5-325 MG per  tablet 1-2 tablet  1-2 tablet Oral Q4H PRN Amber Constable, PA-C      . polyethylene glycol (MIRALAX / GLYCOLAX) packet 17 g  17 g Oral Daily PRN Amber Constable, PA-C      . rosuvastatin (CRESTOR) tablet 10 mg  10 mg Oral Q M,W,F-2000 Amber Constable, PA-C   10 mg at 09/15/15 2133  . sodium chloride flush (NS) 0.9 % injection 3 mL  3 mL Intravenous Q12H Amber Constable, PA-C   3 mL at 09/16/15 0947  . sodium chloride flush (NS) 0.9 % injection 3 mL  3 mL Intravenous PRN Amber Constable, PA-C      . sodium phosphate (FLEET) 7-19 GM/118ML enema 1 enema  1 enema Rectal Once PRN Amber Constable, PA-C      . warfarin (COUMADIN) tablet 2 mg  2 mg Oral Once per day on Sun Mon Wed Thu Sat Ardeen Jourdain, PA-C   2 mg at 09/15/15 2134  . warfarin (COUMADIN) tablet 3 mg  3 mg Oral Once per day on Tue Fri [employer], [place]      . Warfarin - Physician Dosing Inpatient   Does not apply [id number] Latanya Maudlin, MD         Discharge Medications: Please see discharge summary for a list of discharge medications.  Relevant Imaging Results:  Relevant Lab Results:   Additional Information SS # [id number]  Dorean Hiebert, Randall An, LCSW

## 2015-09-17 ENCOUNTER — Inpatient Hospital Stay (HOSPITAL_COMMUNITY): Payer: Medicare Other

## 2015-09-17 DIAGNOSIS — M6281 Muscle weakness (generalized): Secondary | ICD-10-CM

## 2015-09-17 DIAGNOSIS — G9520 Unspecified cord compression: Secondary | ICD-10-CM

## 2015-09-17 DIAGNOSIS — I481 Persistent atrial fibrillation: Secondary | ICD-10-CM

## 2015-09-17 DIAGNOSIS — Z9889 Other specified postprocedural states: Secondary | ICD-10-CM

## 2015-09-17 LAB — PROTIME-INR
INR: 2.45 — AB (ref 0.00–1.49)
Prothrombin Time: 26.3 seconds — ABNORMAL HIGH (ref 11.6–15.2)

## 2015-09-17 LAB — URINALYSIS, ROUTINE W REFLEX MICROSCOPIC
Bilirubin Urine: NEGATIVE
GLUCOSE, UA: NEGATIVE mg/dL
Ketones, ur: NEGATIVE mg/dL
NITRITE: POSITIVE — AB
PROTEIN: 100 mg/dL — AB
Specific Gravity, Urine: 1.018 (ref 1.005–1.030)
pH: 6 (ref 5.0–8.0)

## 2015-09-17 LAB — URINE MICROSCOPIC-ADD ON

## 2015-09-17 LAB — GLUCOSE, CAPILLARY
Glucose-Capillary: 101 mg/dL — ABNORMAL HIGH (ref 65–99)
Glucose-Capillary: 108 mg/dL — ABNORMAL HIGH (ref 65–99)

## 2015-09-17 MED ORDER — INSULIN ASPART 100 UNIT/ML ~~LOC~~ SOLN
0.0000 [IU] | Freq: Three times a day (TID) | SUBCUTANEOUS | Status: DC
  Administered 2015-09-18 – 2015-09-22 (×9): 1 [IU] via SUBCUTANEOUS
  Administered 2015-09-23: 2 [IU] via SUBCUTANEOUS

## 2015-09-17 MED ORDER — INSULIN ASPART 100 UNIT/ML ~~LOC~~ SOLN: 0.0000 [IU] | Freq: Every day | SUBCUTANEOUS | Status: DC

## 2015-09-17 MED ORDER — DEXTROSE 5 % IV SOLN
10.0000 mg | INTRAVENOUS | Status: AC
  Administered 2015-09-17: 10 mg via INTRAVENOUS
  Filled 2015-09-17: qty 1

## 2015-09-17 MED ORDER — CEFAZOLIN SODIUM-DEXTROSE 2-4 GM/100ML-% IV SOLN
2.0000 g | INTRAVENOUS | Status: DC
  Filled 2015-09-17: qty 100

## 2015-09-17 NOTE — Plan of Care (Signed)
NP spoke with Dr. Cyndy Freeze of [facility] after he saw pt. Dr. Cyndy Freeze states surgery is not an emergency and we can lower her INR. He wants Vit K 10 IV tonight and tomorrow. Check INR daily. When INR < 1.5, he will take pt to OR. Pt may eat now and be kept NPO after MN.  KJKG, NP [facility]

## 2015-09-17 NOTE — Progress Notes (Signed)
CSW assisting with d/c planning. Pt / son are aware that pt's admission status has been changed to in patient and there is a possibility that medicare may assist with SNF placement. [facility] has been updated.  Werner Lean LCSW [phone]

## 2015-09-17 NOTE — Progress Notes (Signed)
CSW assisting with d/c planning. Pt has a pvt pay SNF bed at [facility] when ready for d/c.   Werner Lean LCSW [phone]

## 2015-09-17 NOTE — Progress Notes (Signed)
Patient arrived to the unit via [facility], admitting notified via text page

## 2015-09-17 NOTE — Progress Notes (Signed)
Report called to Jerral Bonito at [facility], [place].

## 2015-09-17 NOTE — Consult Note (Signed)
CC:  No chief complaint on file.   HPI: [name] [name] is a 82 y.o. female with lower extremity weakness for 1 week. This affects mostly the right lower extremity. She has been ambulating with a walker for the last 3 years but as of 1 week ago she was unable to stand with a walker because of weakness in her right hip flexor. She has been moving her legs well otherwise. She denies sensory changes. She reportedly has long-standing urinary incontinence. She had an MRI of her thoracic spine today which shows multilevel spondylosis with spinal cord compression. She was transferred over here for surgical evaluation.  PMH: Past Medical History  Diagnosis Date  . Atrial fibrillation ([place])   . Hypertension   . Hyperlipidemia   . Osteoarthritis   . Diabetes ([place])     prediabetes    PSH: Past Surgical History  Procedure Laterality Date  . Wisdom tooth extraction    . Tonsillectomy    . Neck surgery    . Back surgery    . Knee surgery      2 total knees -right and left  . Total hip arthroplasty      right and left hips  . Cataract extraction  Apr 11, 2012, Jun 04 2012  . Carpal tunnel release Right   . Hip surgery      restrictor in left hip due to 3 hip dislocations    SH: Social History  Substance Use Topics  . Smoking status: Former Smoker -- 20 years    Types: Cigarettes    Quit date: 09/15/1975  . Smokeless tobacco: Never Used  . Alcohol Use: 0.0 oz/week    0 Standard drinks or equivalent per week     Comment: social    MEDS: Prior to Admission medications   Medication Sig Start Date End Date Taking? Authorizing Provider  bisoprolol-hydrochlorothiazide (ZIAC) 10-6.25 MG per tablet Take 1 tablet by mouth 2 (two) times daily.    Yes Historical Provider, MD  calcium carbonate (TUMS EX) 750 MG chewable tablet Chew 2 tablets by mouth daily as needed for heartburn.   Yes Historical Provider, MD  Cholecalciferol (VITAMIN D) 2000 UNITS tablet Take 2,000 Units by mouth daily.     Yes Historical Provider, MD  cyclobenzaprine (FLEXERIL) 5 MG tablet Take 1 tablet (5 mg total) by mouth 2 (two) times daily as needed for muscle spasms. Patient taking differently: Take 10 mg by mouth at bedtime.  09/10/15  Yes Daleen Bo, MD  hydrocortisone cream 1 % Apply 1 application topically daily as needed for itching.   Yes Historical Provider, MD  loperamide (IMODIUM A-D) 2 MG tablet Take 4 mg by mouth 4 (four) times daily as needed for diarrhea or loose stools.   Yes Historical Provider, MD  losartan (COZAAR) 100 MG tablet Take 100 mg by mouth daily.   Yes Historical Provider, MD  Multiple Vitamin (MULTIVITAMIN WITH MINERALS) TABS tablet Take 1 tablet by mouth daily.   Yes Historical Provider, MD  oxybutynin (DITROPAN) 5 MG tablet Take 5 mg by mouth 2 (two) times daily.    Yes Historical Provider, MD  ranitidine (ZANTAC) 75 MG tablet Take 75 mg by mouth daily as needed for heartburn.   Yes Historical Provider, MD  rosuvastatin (CRESTOR) 10 MG tablet Take 10 mg by mouth every Monday, Wednesday, and Friday. m w f at bedtime   Yes Historical Provider, MD  warfarin (COUMADIN) 2 MG tablet Take 2 mg by mouth See  admin instructions. Take 2mg s daily on Sun,Mon,Wed,Thurs,Sat and take 3mg s on Tues and Fri   Yes Historical Provider, MD  Wheat Dextrin (BENEFIBER) POWD Mix 1 heaping teaspoon in water or juice daily 12/31/14  Yes Lafayette Dragon, MD  HYDROcodone-acetaminophen (NORCO) 5-325 MG tablet Take 1 tablet by mouth every 4 (four) hours as needed. 09/10/15   Daleen Bo, MD    ALLERGY: Allergies  Allergen Reactions  . Nsaids     On coumadin  . Statins     Cause leg cramps    ROS: Review of Systems  Constitutional: Negative.   HENT: Negative.   Respiratory: Negative.   Cardiovascular: Negative.   Genitourinary: Negative.   Skin: Negative.   Neurological: Positive for focal weakness. Negative for dizziness, tingling, tremors, sensory change, speech change, seizures and loss of  consciousness.  Psychiatric/Behavioral: Negative.     NEUROLOGIC EXAM: Awake, alert, oriented Memory and concentration grossly intact Speech fluent, appropriate CN grossly intact Motor exam: Upper Extremities Deltoid Bicep Tricep Grip  Right 5/5 5/5 5/5 5/5  Left 5/5 5/5 5/5 5/5   Lower Extremity IP Quad PF DF EHL  Right 4-/5 4/5 5/5 5/5 5/5  Left 5/5 5/5 5/5 5/5 5/5   Sensation grossly intact to LT Absent deep tendon reflexes  IMAGING: She has multilevel spondylosis in her thoracic spine.  This causes spinal stenosis at T2-3, T3-4, and T5-6.  At T3-4 and T5-6 she also has disc bulges/herniations. These contribute to spinal cord compression.  IMPRESSION: - 82 y.o. female with thoracic spondylosis with myelopathy and spinal stenosis at T2-3, T3-4, and T5-6. This is a relatively slowly progressive process. There is no surgical emergency to this. I had a long discussion with her and explained that without decompression surgery I expect that she will continue to deteriorate. I explained to her that I will only be able to perform decompression dorsally. I do not think that given her medical comorbidities she will tolerate the procedure required to treat her anteriorly and posteriorly. I think she will still derive significant benefit from dorsal decompression alone. I also explained that the first goal of surgery is prevention of further neurological loss. The secondary goal is return of her previous neurological function.  PLAN: - give IV vitamin K tonight and daily until her INR returns to less than 1.5.  No need for aggressive correction with FFP. - NPO after midnight, ok to eat until then - Tentatively posted for tomorrow afternoon (T2-3, 3-4, and 5-6 laminectomies for decompression)

## 2015-09-17 NOTE — Consult Note (Signed)
NEURO HOSPITALIST CONSULT NOTE   Requestig physician: Dr. Gladstone Lighter   Reason for Consult: Bilateral lower extremity weakness.    History obtained from:  Patient and Chart     HPI:                                                                                                                                          [name] [name] is an 82 y.o. female who presented to [facility] with right leg pain and bilateral lower extremity weakness, right worse than left. Onset was sudden on awakening about 2 weeks ago. She states that for the past 2 weeks, she has continued to experience the weakness as well as sensory changes up to her upper abdomen. She also has had worsening of pre-existing urinary incontinence.   Per primary team H and P: "Patient presented with the chief complaint of right leg pain and weakness. She was seen in the office two weeks ago with bilateral leg weakness. She reported that she had been fatigued because she had the flu. She did not have any neurological deficits when seen at the office 2 weeks ago. She was started on home PT but was unable to participate in more than one visit because of acute right leg pain. She reports that she went to the ED where they took x-rays of her pelvis and knees, showing no bony abnormalities or complications with her THAs and TKAs. Since being sent back home she has been unable to care for herself in anyway because of the pain and weakness. She presents with severe right leg pain and weakness with acute neuro deficits on exam. She has a history of a lumbar fusion in the 1980s by Dr. Agapito Games at [facility]."  Past Medical History  Diagnosis Date  . Atrial fibrillation ([place])   . Hypertension   . Hyperlipidemia   . Osteoarthritis   . Diabetes ([place])     prediabetes    Past Surgical History  Procedure Laterality Date  . Wisdom tooth extraction    . Tonsillectomy    . Neck surgery    . Back surgery     . Knee surgery      2 total knees -right and left  . Total hip arthroplasty      right and left hips  . Cataract extraction  Apr 11, 2012, Jun 04 2012  . Carpal tunnel release Right   . Hip surgery      restrictor in left hip due to 3 hip dislocations    Family History  Problem Relation Age of Onset  . Heart disease Mother   . Colon cancer Father     dx in his late 8's, died age 37  . Uterine cancer Sister 89  spread to colon   Social History:  reports that she quit smoking about 40 years ago. Her smoking use included Cigarettes. She quit after 20 years of use. She has never used smokeless tobacco. She reports that she drinks alcohol. She reports that she does not use illicit drugs.  Allergies  Allergen Reactions  . Nsaids     On coumadin  . Statins     Cause leg cramps    MEDICATIONS:                                                                                                                      Current facility-administered medications:  .  0.9 %  sodium chloride infusion, 250 mL, Intravenous, PRN, Amber Constable, PA-C .  0.9 %  sodium chloride infusion, , Intravenous, Continuous, Amber Constable, PA-C, Last Rate: 75 mL/hr at 09/16/15 1348 .  acetaminophen (TYLENOL) tablet 650 mg, 650 mg, Oral, Q6H PRN **OR** acetaminophen (TYLENOL) suppository 650 mg, 650 mg, Rectal, Q6H PRN, Amber Constable, PA-C .  bisacodyl (DULCOLAX) suppository 10 mg, 10 mg, Rectal, Daily PRN, [employer], PA-C, 10 mg at 09/16/15 1656 .  bisoprolol-hydrochlorothiazide (ZIAC) 10-6.25 MG per tablet 1 tablet, 1 tablet, Oral, BID, Latanya Maudlin, MD, 1 tablet at 09/17/15 [phone] .  cholecalciferol (VITAMIN D) tablet 2,000 Units, 2,000 Units, Oral, Daily, [employer], PA-C, 2,000 Units at 09/17/15 [phone] .  cyclobenzaprine (FLEXERIL) tablet 10 mg, 10 mg, Oral, QHS, Latanya Maudlin, MD, 10 mg at 09/16/15 2134 .  docusate sodium (COLACE) capsule 100 mg, 100 mg, Oral, BID, Amber Constable, PA-C, 100  mg at 09/17/15 0951 .  HYDROcodone-acetaminophen (NORCO/VICODIN) 5-325 MG per tablet 1 tablet, 1 tablet, Oral, Q4H PRN, [employer], PA-C .  HYDROmorphone (DILAUDID) injection 0.5-1 mg, 0.5-1 mg, Intravenous, Q3H PRN, Amber Constable, PA-C .  insulin aspart (novoLOG) injection 0-5 Units, 0-5 Units, Subcutaneous, QHS, Jessica U Vann, DO .  insulin aspart (novoLOG) injection 0-9 Units, 0-9 Units, Subcutaneous, TID WC, Jessica U Vann, DO .  losartan (COZAAR) tablet 100 mg, 100 mg, Oral, Daily, [employer], PA-C, 100 mg at 09/17/15 0951 .  ondansetron (ZOFRAN) tablet 4 mg, 4 mg, Oral, Q6H PRN **OR** ondansetron (ZOFRAN) injection 4 mg, 4 mg, Intravenous, Q6H PRN, Amber Constable, PA-C .  oxybutynin (DITROPAN) tablet 5 mg, 5 mg, Oral, BID, Amber Constable, PA-C, 5 mg at 09/17/15 0951 .  oxyCODONE-acetaminophen (PERCOCET/ROXICET) 5-325 MG per tablet 1-2 tablet, 1-2 tablet, Oral, Q4H PRN, Amber Constable, PA-C .  polyethylene glycol (MIRALAX / GLYCOLAX) packet 17 g, 17 g, Oral, Daily PRN, [employer], PA-C, 17 g at 09/16/15 1347 .  rosuvastatin (CRESTOR) tablet 10 mg, 10 mg, Oral, Q M,W,F-2000, Amber Constable, PA-C, 10 mg at 09/15/15 2133 .  sodium chloride flush (NS) 0.9 % injection 3 mL, 3 mL, Intravenous, Q12H, Amber Constable, PA-C, 3 mL at 09/17/15 1030 .  sodium chloride flush (NS) 0.9 % injection 3 mL, 3 mL, Intravenous, PRN, Amber Constable, PA-C .  sodium phosphate (FLEET)  7-19 GM/118ML enema 1 enema, 1 enema, Rectal, Once PRN, Amber Constable, PA-C    ROS:                                                                                                                                       History obtained from patient. ROS as per HPI. Does not endorse current fevers or chills.    Blood pressure 121/73, pulse 138, temperature 99.9 F (37.7 C), temperature source Oral, resp. rate 16, height 5\' 7"  (1.702 m), weight 108.41 kg (239 lb), SpO2 97 %.   Neurologic Examination:                                                                                                       HEENT-  Normocephalic, no lesions, without obvious abnormality.  Normal external eye and conjunctiva.    Extremities- pigmentary changes to skin of legs bilaterally, left worse than right.    Neurological Examination Mental Status: Alert, oriented, thought content appropriate.  Speech fluent without evidence of aphasia.  Able to follow all commands without difficulty. Cranial Nerves: II: Visual fields intact, pupils equal, round, reactive to light  III,IV, VI: ptosis not present, EOMI V,VII: smile symmetric, facial temperature sensation normal bilaterally VIII: hearing intact to conversation IX,X: no dysphonia  XI: bilateral shoulder shrug normal XII: midline tongue extension Motor: Right : Upper extremity   5/5     Left:     Upper extremity   5/5  Lower extremity   2/5 proximally and distally   Lower extremity   2/5 proximally and distally Weakness slightly worse to RLE than LLE Sensory: Light touch diminished to left leg below knee. Mild dysesthesia to legs and thighs bilaterally.  Deep Tendon Reflexes: 0 patellae and achilles bilaterally. 1+ biceps and brachioradialis bilaterally.  Plantars: Right: briskly upgoing   Left: briskly upgoing Cerebellar: No ataxia on FNF bilaterally.  Gait: Unable to assess secondary to weakness.    Lab Results: Basic Metabolic Panel:  Recent Labs Lab 09/15/15 1638 09/16/15 0433  NA 143 140  K 3.7 4.8  CL 104 102  CO2 28 28  GLUCOSE 132* 153*  BUN 50* 49*  CREATININE 1.80* 1.61*  CALCIUM 8.8* 8.6*    Liver Function Tests:  Recent Labs Lab 09/15/15 1638 09/16/15 0433  AST 27 23  ALT 19 17  ALKPHOS 54 52  BILITOT 2.4* 2.5*  PROT 6.4* 6.2*  ALBUMIN 3.2* 3.0*   No results for input(s): LIPASE,  AMYLASE in the last 168 hours. No results for input(s): AMMONIA in the last 168 hours.  CBC:  Recent Labs Lab 09/15/15 1638 09/16/15 0433  WBC  10.3 9.8  NEUTROABS 7.6  --   HGB 10.3* 10.0*  HCT 31.2* 31.8*  MCV 92.6 97.2  PLT 303 309    Cardiac Enzymes: No results for input(s): CKTOTAL, CKMB, CKMBINDEX, TROPONINI in the last 168 hours.  Lipid Panel: No results for input(s): CHOL, TRIG, HDL, CHOLHDL, VLDL, LDLCALC in the last 168 hours.  CBG: No results for input(s): GLUCAP in the last 168 hours.  Microbiology: Results for orders placed or performed in visit on 01/13/15  Fecal Fat, Qualitative     Status: Abnormal   Collection Time: 01/13/15  1:21 PM  Result Value Ref Range Status   Fecal Fat Qualitative Abnormal (A) Normal Final    Comment: Abnormal: Fat globules, 1 to 8 microns in diameter, and >=100 globules per high power field were observed microscopically.     Coagulation Studies:  Recent Labs  09/15/15 1638 09/16/15 0433 09/17/15 0358  LABPROT 23.4* 23.5* 26.3*  INR 2.09* 2.11* 2.45*    Imaging: Dg Lumbar Spine 2-3 Views  09/15/2015  CLINICAL DATA:  Pre mri images; c/o abdominal spasms, numerous previous spinal surgeries EXAM: LUMBAR SPINE - 2-3 VIEW COMPARISON:  None. FINDINGS: Spinal rods throughout the lumbar spine. Bilateral hip replacements. Spinal rods extend from T12 through L5. Cross-table lateral extremely limited. There appears to be interbody fusion device at L4-5, L3-4, and L2-3. IMPRESSION: Extensive prior surgery. Electronically Signed   By: Skipper Cliche M.D.   On: 09/15/2015 20:47   Mr Thoracic Spine Wo Contrast  09/17/2015  ADDENDUM REPORT: 09/17/2015 13:04 ADDENDUM: Critical Value/emergent results were called by telephone at the time of interpretation on 09/17/2015 at 1:00 pm toDr. Kerney Elbe, who verbally acknowledged these results. Electronically Signed   By: Richardean Sale M.D.   On: 09/17/2015 13:04  09/17/2015  CLINICAL DATA:  Bilateral lower extremity weakness with limited ability to walk over the last week. Evaluate for cord compression. EXAM: MRI THORACIC SPINE WITHOUT  CONTRAST TECHNIQUE: Multiplanar, multisequence MR imaging of the thoracic spine was performed. No intravenous contrast was administered. COMPARISON:  Lumbar MRI 09/15/2015.  Lumbar myelogram CT 12/08/2005. FINDINGS: Alignment:  Normal. Bones: The sagittal localizing images through the cervical spine demonstrate previous C4-6 ACDF. Patient is also status post thoracolumbar fusion extending from T12 through L5. Within the thoracic spine, no evidence of acute fracture or worrisome osseous lesion. There are endplate degenerative changes anteriorly at T3-4. There is also prominent adjacent segment disease at T11-12 with T2 hyperintensity in the disc, but no endplate destruction. The thoracic spinal canal is relatively small on a congenital basis. Cord: The CSF surrounding the cord is effaced at multiple levels. There is mild cord flattening with possible associated T2 hyperintensity at the T3-4 and T5-6 levels, best seen on the sagittal images. The distal thoracic cord and conus medullaris appear unchanged. Paraspinal and other soft tissues: No significant paraspinal abnormalities. Hiatal hernia noted. Disc levels: T1-2: Disc bulging with posterior osteophytes. No cord compression or high-grade foraminal narrowing. T2-3: Disc bulging with posterior osteophytes contributing to partial effacement of the CSF surrounding the cord. There is no cord deformity. The left foramen appears mildly narrowed. T3-4: Disc degeneration with broad-based central disc protrusion and osteophytes. There is facet and ligamentous hypertrophy. These factors contribute to the effacement of the CSF surrounding the cord and mild cord flattening. The AP  diameter of the canal is approximately 5 mm. There is right-greater-than-left foraminal narrowing. T4-5: Anterior osteophytes. No significant spinal stenosis or nerve root encroachment. T5-6: Moderate size central disc extrusion is best seen on the sagittal images, causing cord compression and  narrowing the AP diameter of the canal to 4 mm. The foramina appear sufficiently patent. From T6-7 through T10-11, there is mild disc degeneration with anterior osteophytes, but no cord compression or significant foraminal compromise. T11-12: Adjacent segment disease with T2 hyperintensity in the disc and prominent anterior osteophytes. Mild bilateral facet hypertrophy. No cord deformity or significant foraminal compromise. T12-L1: Solid interbody fusion. No spinal stenosis or nerve root encroachment. IMPRESSION: 1. Two levels of probably symptomatic cord compression at T3-4 and T5-6. Findings are primarily secondary to spondylosis at T3-4 and a central disc extrusion at T5-6. At both levels, there is mild cord flattening and possible abnormal cord signal which may reflect edema or myelomalacia. 2. Mild degenerative changes at the other levels as described without definite cord compression. 3. No acute osseous findings. 4. Previous cervical and thoracic fusion. 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 Electronically Signed: By: Richardean Sale M.D. On: 09/17/2015 12:52   Mr Lumbar Spine Wo Contrast  09/15/2015  CLINICAL DATA:  Initial evaluation for right foot drop. EXAM: MRI LUMBAR SPINE WITHOUT CONTRAST TECHNIQUE: Multiplanar, multisequence MR imaging of the lumbar spine was performed. No intravenous contrast was administered. COMPARISON:  Prior radiograph from earlier the same day. FINDINGS: For the purposes of this dictation, lowest well-formed intervertebral disc spaces presumed to be the L5-S1 level, and there presumed to be 5 lumbar type vertebral bodies. Dextroscoliosis with apex at the L2-3 level. Vertebral bodies otherwise normally aligned with preservation of the normal lumbar lordosis. Vertebral body heights maintained. No evidence for fracture. Signal intensity within the vertebral body bone marrow within normal limits. Postoperative changes from fairly extensive remote thoracolumbar fusion and  decompression present. Patient is fused from the T12 through L5 levels. Bilateral transpedicular screws in place at T12 L4 and L5. Right-sided transpedicular screws in place at L1 and L2, with a left-sided transpedicular screw in place at L3. Interbody fusions in place at L2-3, L3-4, and L4-5. Essentially complete ankylosis/ fusion at the T12-L1 and L1-2 levels as well. No complication about the hardware seen on this examination. Conus medullaris terminates normally at the T12-L1 level. Signal intensity within the visualized cord is normal. Nerve roots of the cauda equina within normal limits. No acute paraspinous soft tissue abnormality. Fatty atrophy noted within the paraspinous and psoas musculature, greater on the left. Partially visualized urinary bladder appears moderately distended. T10-11: Seen only on sagittal projection. Disc desiccation without significant disc bulge. Mild canal stenosis. No significant foraminal narrowing. T11-12: Disc desiccation with mild annular disc bulge. Endplate osteophytic spurring along the right anterior lateral aspect of the T11-12 disc space. Bilateral facet arthrosis. Resultant mild canal stenosis. Mild bilateral foraminal narrowing. T12-L1: Status post fusion. Bilateral facet arthrosis. Prominent osteophytic overgrowth along the right anterolateral and lateral aspect of the fused T12-L1 disc space. Mild right lateral recess stenosis. Mild foraminal narrowing bilaterally. L1-2: Status post fusion and posterior decompression. Small central osseous ridge or possibly residual disc material indents the ventral thecal sac without significant canal stenosis. Foramina grossly patent. L2-3: Status post decompression with fusion. Central canal widely patent. No significant foraminal stenosis. L3-4: Status post decompression with fusion. Central canal widely patent. Probable mild left foraminal narrowing. Right neural foramen widely patent. L4-5: Status post wide decompression with  fusion.  Central canal widely patent. Bilateral facet arthrosis. Mild to moderate right foraminal narrowing related to facet disease. Left neural foramina widely patent. L5-S1: Diffuse degenerative intervertebral disc space narrowing with disc desiccation and disc bulge. No focal disc protrusion. Moderate to severe bilateral facet arthrosis. Fairly severe right foraminal narrowing related to disc bulge and osteophytic overgrowth at the hypertrophied right L5-S1 facet (series 3, image 6). More mild left foraminal stenosis. Mild to moderate canal narrowing. IMPRESSION: 1. Postoperative changes from remote thoracolumbar fusion and decompression at T12 through L5 as above. No significant canal or foraminal stenosis at these levels. 2. Multifactorial degenerative spondylolysis at L5-S1 with resultant severe right foraminal stenosis. This could potentially result in the patient's right lower extremity symptoms. 3. Dextroscoliosis with additional more mild multilevel degenerative changes as above. Please see above report for a full description of these findings. 4. Age-related fatty atrophy within the paraspinous and psoas musculature. Electronically Signed   By: Jeannine Boga M.D.   On: 09/15/2015 22:04   Dg Chest Port 1 View  09/17/2015  CLINICAL DATA:  Leg weakness EXAM: PORTABLE CHEST 1 VIEW COMPARISON:  10/29/2008 FINDINGS: Normal heart size. Lungs clear. No pneumothorax. No pleural effusion. Severe chronic changes at both shoulder joints with chronic rotator cuff tearing. IMPRESSION: No active disease. Electronically Signed   By: Marybelle Killings M.D.   On: 09/17/2015 15:54   Assessment: Bilateral lower extremity paresis of 2 weeks' duration. Mild sensory symptoms/signs also present. Thoracic spinal cord compressions at 2 levels are noted on T-spine MRI obtained today. The thoracic MRI findings are felt most likely to be the underlying etiology for most if not all of the patient's subacute lower extremity  motor deficits.   Recommendations: Expedited spine surgery consult. Discussed with patient and primary team.   Kerney Elbe, MD 09/17/2015, 6:40 PM

## 2015-09-17 NOTE — Progress Notes (Signed)
PT Cancellation Note  Patient Details Name: Mahdia Kor MRN: [id number] DOB: March 21, 1934   Cancelled Treatment:    Reason Eval/Treat Not Completed: Medical issues which prohibited therapy (having more test for unknown cause of leg weakness.)   Claretha Cooper 09/17/2015, 11:01 AM Tresa Endo PT [phone]

## 2015-09-17 NOTE — Progress Notes (Signed)
Physical Therapy Discharge Patient Details Name: [name] [name] MRN: [id number] DOB: 12-Aug-1933 Today's Date: 09/17/2015 Time:  -     Patient discharged from PT services secondary to surgery - will need to re-order PT to resume therapy services.  Please see latest therapy progress note for current level of functioning and progress toward goals.        Marcelino Freestone PT [id number]  09/17/2015, 4:08 PM

## 2015-09-17 NOTE — Discharge Instructions (Addendum)
Must walk with your walker No lifting or bending  Spinal Compression Fracture A spinal compression fracture is a collapse of the bones that form the spine (vertebrae). With this type of fracture, the vertebrae become squashed (compressed) into a wedge shape. Most compression fractures happen in the middle or lower part of the spine. CAUSES This condition may be caused by:  Thinning and loss of density in the bones (osteoporosis). This is the most common cause.  A fall.  A car or motorcycle accident.  Cancer.  Trauma, such as a heavy, direct hit to the head. RISK FACTORS You may be at greater risk for a spinal compression fracture if you:  Are 53 years old or older.  Have osteoporosis.  Have certain types of cancer, including:  Multiple myeloma.  Lymphoma.  Prostate cancer.  Lung cancer.  Breast cancer. SYMPTOMS Symptoms of this condition include:  Severe pain.  Pain that gets worse over time.  Pain that is worse when you stand, walk, sit, or bend.  Sudden pain that is so bad that it is hard for you to move.  Bending or humping of the spine.  Gradual loss of height.  Numbness, tingling, or weakness in the back and legs.  Trouble walking. Your symptoms will depend on the cause of the fracture and how quickly it develops. For example, fractures that are caused by osteoporosis can cause few symptoms, no symptoms, or symptoms that develop slowly over time. DIAGNOSIS This condition may be diagnosed based on symptoms, medical history, and a physical exam. During the physical exam, your health care provider may tap along the length of your spine to check for tenderness. Tests may be done to confirm the diagnosis. They may include:  A bone density test to check for osteoporosis.  Imaging tests, such as a spine X-ray, a CT scan, or MRI. TREATMENT Treatment for this condition depends on the cause and severity of the condition.Some fractures, such as those that are  caused by osteoporosis, may heal on their own with supportive care. This may include:  Pain medicine.  Rest.  A back brace.  Physical therapy exercises.  Medicine that reduces bone pain.  Calcium and vitamin D supplements. Fractures that cause the back to become misshapen, cause nerve pain or weakness, or do not respond to other treatment may be treated with a surgical procedure, such as:  Vertebroplasty. In this procedure, bone cement is injected into the collapsed vertebrae to stabilize them.  Balloon kyphoplasty. In this procedure, the collapsed vertebrae are expanded with a balloon and then bone cement is injected into them.  Spinal fusion. In this procedure, the collapsed vertebrae are connected (fused) to normal vertebrae. HOME CARE INSTRUCTIONS General Instructions  Take medicines only as directed by your health care provider.  Do not drive or operate heavy machinery while taking pain medicine.  If directed, apply ice to the injured area:  Put ice in a plastic bag.  Place a towel between your skin and the bag.  Leave the ice on for 30 minutes every two hours at first. Then apply the ice as needed.  Wear your neck brace or back brace as directed by your health care provider.  Do not drink alcohol. Alcohol can interfere with your treatment.  Keep all follow-up visits as directed by your health care provider. This is important. It can help to prevent permanent injury, disability, and long-lasting (chronic) pain. Activity  Stay in bed (on bed rest) only as directed by your health  care provider. Being on bed rest for too long can make your condition worse.  Return to your normal activities as directed by your health care provider. Ask what activities are safe for you.  Do exercises to improve motion and strength in your back (physical therapy), as recommended by your health care provider.  Exercise regularly as directed by your health care provider. SEEK MEDICAL  CARE IF:  You have a fever.  You develop a cough that makes your pain worse.  Your pain medicine is not helping.  Your pain does not get better over time.  You cannot return to your normal activities as planned or expected. SEEK IMMEDIATE MEDICAL CARE IF:  Your pain is very bad and it suddenly gets worse.  You are unable to move any body part (paralysis) that is below the level of your injury.  You have numbness, tingling, or weakness in any body part that is below the level of your injury.  You cannot control your bladder or bowels.   This information is not intended to replace advice given to you by your health care provider. Make sure you discuss any questions you have with your health care provider.   Document Released: 06/14/2005 Document Revised: 10/29/2014 Document Reviewed: 06/18/2014 Elsevier Interactive Patient Education [employer].

## 2015-09-17 NOTE — Consult Note (Signed)
[facility] Medical Consultation  [name] [name] [id number] DOB: 07/31/1933 DOA: 09/15/2015 PCP: Gennette Pac, MD   Requesting physician: ortho request [place] to take over Date of consultation: 3/22 Reason for consultation: take over care  Impression/Recommendations Active Problems:   History of lumbar fusion   Right foot drop   Cord compression [facility])  Cord compression -Dr. Gladstone Lighter spoke with Dr. Cyndy Freeze- transfer to [place]- will make NPO as I do not know of what plans for surgery are currently -Dr. Cheral Marker (neuro) did consult as well  H/o a fib -hold coumadin- may need to reverse if surgery planned tonight- INR currently at 2.45 -continue home meds [place] VASC2 score at least 5- so will need anticoagulation resumed once able  Fever -dg chest and urine -blood culture if > 38.3  HTN/HLD -stable  DM -carb mod diet when able -SSI  Chief Complaint: unable to move legs  HPI:  Patient was admitted by Dr. Gladstone Lighter with c/o right leg pain and weakness.  She has been getting weaker since January.  On March 15, she was unable to get out of bed and was unable to walk.  She had severe right leg pain (that is her "good" leg).  She was seen in the ER and sent home with home health.  She states that she is unable to move her Legs. MRI of Lumbar spine was not helpful. She had Spinal Surgery at [facility] years ago. She has had persistent Incontinence since that surgery.   MRI of her thoracic spine showed:  Two levels of probably symptomatic cord compression at T3-4 and T5-6. Findings are primarily secondary to spondylosis at T3-4 and a central disc extrusion at T5-6. At both levels, there is mild cord flattening and possible abnormal cord signal which may reflect edema or myelomalacia.  Dr. Gladstone Lighter spoke with Dr. Cyndy Freeze who wanted patient transferred to [facility] under the hospitalist due to medical issues.     Review of Systems:  All systems reviewed, negative unless stated  above   Past Medical History  Diagnosis Date  . Atrial fibrillation ([place])   . Hypertension   . Hyperlipidemia   . Osteoarthritis   . Diabetes ([place])     prediabetes   Past Surgical History  Procedure Laterality Date  . Wisdom tooth extraction    . Tonsillectomy    . Neck surgery    . Back surgery    . Knee surgery      2 total knees -right and left  . Total hip arthroplasty      right and left hips  . Cataract extraction  Apr 11, 2012, Jun 04 2012  . Carpal tunnel release Right   . Hip surgery      restrictor in left hip due to 3 hip dislocations   Social History:  reports that she quit smoking about 40 years ago. Her smoking use included Cigarettes. She quit after 20 years of use. She has never used smokeless tobacco. She reports that she drinks alcohol. She reports that she does not use illicit drugs.  Allergies  Allergen Reactions  . Nsaids     On coumadin  . Statins     Cause leg cramps   Family History  Problem Relation Age of Onset  . Heart disease Mother   . Colon cancer Father     dx in his late 45's, died age 47  . Uterine cancer Sister 79    spread to colon    Prior to Admission  medications   Medication Sig Start Date End Date Taking? Authorizing Provider  bisoprolol-hydrochlorothiazide (ZIAC) 10-6.25 MG per tablet Take 1 tablet by mouth 2 (two) times daily.    Yes Historical Provider, MD  calcium carbonate (TUMS EX) 750 MG chewable tablet Chew 2 tablets by mouth daily as needed for heartburn.   Yes Historical Provider, MD  Cholecalciferol (VITAMIN D) 2000 UNITS tablet Take 2,000 Units by mouth daily.    Yes Historical Provider, MD  cyclobenzaprine (FLEXERIL) 5 MG tablet Take 1 tablet (5 mg total) by mouth 2 (two) times daily as needed for muscle spasms. Patient taking differently: Take 10 mg by mouth at bedtime.  09/10/15  Yes Daleen Bo, MD  hydrocortisone cream 1 % Apply 1 application topically daily as needed for itching.   Yes Historical Provider, MD   loperamide (IMODIUM A-D) 2 MG tablet Take 4 mg by mouth 4 (four) times daily as needed for diarrhea or loose stools.   Yes Historical Provider, MD  losartan (COZAAR) 100 MG tablet Take 100 mg by mouth daily.   Yes Historical Provider, MD  Multiple Vitamin (MULTIVITAMIN WITH MINERALS) TABS tablet Take 1 tablet by mouth daily.   Yes Historical Provider, MD  oxybutynin (DITROPAN) 5 MG tablet Take 5 mg by mouth 2 (two) times daily.    Yes Historical Provider, MD  ranitidine (ZANTAC) 75 MG tablet Take 75 mg by mouth daily as needed for heartburn.   Yes Historical Provider, MD  rosuvastatin (CRESTOR) 10 MG tablet Take 10 mg by mouth every Monday, Wednesday, and Friday. m w f at bedtime   Yes Historical Provider, MD  warfarin (COUMADIN) 2 MG tablet Take 2 mg by mouth See admin instructions. Take 2mg s daily on Sun,Mon,Wed,Thurs,Sat and take 3mg s on Tues and Fri   Yes Historical Provider, MD  Wheat Dextrin (BENEFIBER) POWD Mix 1 heaping teaspoon in water or juice daily 12/31/14  Yes Lafayette Dragon, MD  HYDROcodone-acetaminophen (NORCO) 5-325 MG tablet Take 1 tablet by mouth every 4 (four) hours as needed. 09/10/15   Daleen Bo, MD   Physical Exam: Blood pressure 120/52, pulse 92, temperature 100.4 F (38 C), temperature source Oral, resp. rate 16, height 5\' 7"  (1.702 m), weight 108.41 kg (239 lb), SpO2 97 %. Filed Vitals:   09/17/15 0438 09/17/15 1300  BP: 120/67 120/52  Pulse: 92 92  Temp: 99.2 F (37.3 C) 100.4 F (38 C)  Resp: 16 16     General:  Awake, NAd  Neck: supple  Cardiovascular: irr, rate controlled  Respiratory: clear, no wheezing  Abdomen: +Bs, soft  Skin: no rash on visible skin  Musculoskeletal: weakness in LE  Psychiatric: normal  Neurologic: weakness in LE  Labs on Admission:  Basic Metabolic Panel:  Recent Labs Lab 09/15/15 1638 09/16/15 0433  NA 143 140  K 3.7 4.8  CL 104 102  CO2 28 28  GLUCOSE 132* 153*  BUN 50* 49*  CREATININE 1.80* 1.61*   CALCIUM 8.8* 8.6*   Liver Function Tests:  Recent Labs Lab 09/15/15 1638 09/16/15 0433  AST 27 23  ALT 19 17  ALKPHOS 54 52  BILITOT 2.4* 2.5*  PROT 6.4* 6.2*  ALBUMIN 3.2* 3.0*   No results for input(s): LIPASE, AMYLASE in the last 168 hours. No results for input(s): AMMONIA in the last 168 hours. CBC:  Recent Labs Lab 09/15/15 1638 09/16/15 0433  WBC 10.3 9.8  NEUTROABS 7.6  --   HGB 10.3* 10.0*  HCT 31.2* 31.8*  MCV 92.6 97.2  PLT 303 309   Cardiac Enzymes: No results for input(s): CKTOTAL, CKMB, CKMBINDEX, TROPONINI in the last 168 hours. BNP: Invalid input(s): POCBNP CBG: No results for input(s): GLUCAP in the last 168 hours.  Radiological Exams on Admission: Dg Lumbar Spine 2-3 Views  09/15/2015  CLINICAL DATA:  Pre mri images; c/o abdominal spasms, numerous previous spinal surgeries EXAM: LUMBAR SPINE - 2-3 VIEW COMPARISON:  None. FINDINGS: Spinal rods throughout the lumbar spine. Bilateral hip replacements. Spinal rods extend from T12 through L5. Cross-table lateral extremely limited. There appears to be interbody fusion device at L4-5, L3-4, and L2-3. IMPRESSION: Extensive prior surgery. Electronically Signed   By: Skipper Cliche M.D.   On: 09/15/2015 20:47   Mr Thoracic Spine Wo Contrast  09/17/2015  ADDENDUM REPORT: 09/17/2015 13:04 ADDENDUM: Critical Value/emergent results were called by telephone at the time of interpretation on 09/17/2015 at 1:00 pm toDr. Kerney Elbe, who verbally acknowledged these results. Electronically Signed   By: Richardean Sale M.D.   On: 09/17/2015 13:04  09/17/2015  CLINICAL DATA:  Bilateral lower extremity weakness with limited ability to walk over the last week. Evaluate for cord compression. EXAM: MRI THORACIC SPINE WITHOUT CONTRAST TECHNIQUE: Multiplanar, multisequence MR imaging of the thoracic spine was performed. No intravenous contrast was administered. COMPARISON:  Lumbar MRI 09/15/2015.  Lumbar myelogram CT 12/08/2005.  FINDINGS: Alignment:  Normal. Bones: The sagittal localizing images through the cervical spine demonstrate previous C4-6 ACDF. Patient is also status post thoracolumbar fusion extending from T12 through L5. Within the thoracic spine, no evidence of acute fracture or worrisome osseous lesion. There are endplate degenerative changes anteriorly at T3-4. There is also prominent adjacent segment disease at T11-12 with T2 hyperintensity in the disc, but no endplate destruction. The thoracic spinal canal is relatively small on a congenital basis. Cord: The CSF surrounding the cord is effaced at multiple levels. There is mild cord flattening with possible associated T2 hyperintensity at the T3-4 and T5-6 levels, best seen on the sagittal images. The distal thoracic cord and conus medullaris appear unchanged. Paraspinal and other soft tissues: No significant paraspinal abnormalities. Hiatal hernia noted. Disc levels: T1-2: Disc bulging with posterior osteophytes. No cord compression or high-grade foraminal narrowing. T2-3: Disc bulging with posterior osteophytes contributing to partial effacement of the CSF surrounding the cord. There is no cord deformity. The left foramen appears mildly narrowed. T3-4: Disc degeneration with broad-based central disc protrusion and osteophytes. There is facet and ligamentous hypertrophy. These factors contribute to the effacement of the CSF surrounding the cord and mild cord flattening. The AP diameter of the canal is approximately 5 mm. There is right-greater-than-left foraminal narrowing. T4-5: Anterior osteophytes. No significant spinal stenosis or nerve root encroachment. T5-6: Moderate size central disc extrusion is best seen on the sagittal images, causing cord compression and narrowing the AP diameter of the canal to 4 mm. The foramina appear sufficiently patent. From T6-7 through T10-11, there is mild disc degeneration with anterior osteophytes, but no cord compression or significant  foraminal compromise. T11-12: Adjacent segment disease with T2 hyperintensity in the disc and prominent anterior osteophytes. Mild bilateral facet hypertrophy. No cord deformity or significant foraminal compromise. T12-L1: Solid interbody fusion. No spinal stenosis or nerve root encroachment. IMPRESSION: 1. Two levels of probably symptomatic cord compression at T3-4 and T5-6. Findings are primarily secondary to spondylosis at T3-4 and a central disc extrusion at T5-6. At both levels, there is mild cord flattening and possible abnormal cord signal which  may reflect edema or myelomalacia. 2. Mild degenerative changes at the other levels as described without definite cord compression. 3. No acute osseous findings. 4. Previous cervical and thoracic fusion. 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 Electronically Signed: By: Richardean Sale M.D. On: 09/17/2015 12:52   Mr Lumbar Spine Wo Contrast  09/15/2015  CLINICAL DATA:  Initial evaluation for right foot drop. EXAM: MRI LUMBAR SPINE WITHOUT CONTRAST TECHNIQUE: Multiplanar, multisequence MR imaging of the lumbar spine was performed. No intravenous contrast was administered. COMPARISON:  Prior radiograph from earlier the same day. FINDINGS: For the purposes of this dictation, lowest well-formed intervertebral disc spaces presumed to be the L5-S1 level, and there presumed to be 5 lumbar type vertebral bodies. Dextroscoliosis with apex at the L2-3 level. Vertebral bodies otherwise normally aligned with preservation of the normal lumbar lordosis. Vertebral body heights maintained. No evidence for fracture. Signal intensity within the vertebral body bone marrow within normal limits. Postoperative changes from fairly extensive remote thoracolumbar fusion and decompression present. Patient is fused from the T12 through L5 levels. Bilateral transpedicular screws in place at T12 L4 and L5. Right-sided transpedicular screws in place at L1 and L2, with a left-sided transpedicular  screw in place at L3. Interbody fusions in place at L2-3, L3-4, and L4-5. Essentially complete ankylosis/ fusion at the T12-L1 and L1-2 levels as well. No complication about the hardware seen on this examination. Conus medullaris terminates normally at the T12-L1 level. Signal intensity within the visualized cord is normal. Nerve roots of the cauda equina within normal limits. No acute paraspinous soft tissue abnormality. Fatty atrophy noted within the paraspinous and psoas musculature, greater on the left. Partially visualized urinary bladder appears moderately distended. T10-11: Seen only on sagittal projection. Disc desiccation without significant disc bulge. Mild canal stenosis. No significant foraminal narrowing. T11-12: Disc desiccation with mild annular disc bulge. Endplate osteophytic spurring along the right anterior lateral aspect of the T11-12 disc space. Bilateral facet arthrosis. Resultant mild canal stenosis. Mild bilateral foraminal narrowing. T12-L1: Status post fusion. Bilateral facet arthrosis. Prominent osteophytic overgrowth along the right anterolateral and lateral aspect of the fused T12-L1 disc space. Mild right lateral recess stenosis. Mild foraminal narrowing bilaterally. L1-2: Status post fusion and posterior decompression. Small central osseous ridge or possibly residual disc material indents the ventral thecal sac without significant canal stenosis. Foramina grossly patent. L2-3: Status post decompression with fusion. Central canal widely patent. No significant foraminal stenosis. L3-4: Status post decompression with fusion. Central canal widely patent. Probable mild left foraminal narrowing. Right neural foramen widely patent. L4-5: Status post wide decompression with fusion. Central canal widely patent. Bilateral facet arthrosis. Mild to moderate right foraminal narrowing related to facet disease. Left neural foramina widely patent. L5-S1: Diffuse degenerative intervertebral disc space  narrowing with disc desiccation and disc bulge. No focal disc protrusion. Moderate to severe bilateral facet arthrosis. Fairly severe right foraminal narrowing related to disc bulge and osteophytic overgrowth at the hypertrophied right L5-S1 facet (series 3, image 6). More mild left foraminal stenosis. Mild to moderate canal narrowing. IMPRESSION: 1. Postoperative changes from remote thoracolumbar fusion and decompression at T12 through L5 as above. No significant canal or foraminal stenosis at these levels. 2. Multifactorial degenerative spondylolysis at L5-S1 with resultant severe right foraminal stenosis. This could potentially result in the patient's right lower extremity symptoms. 3. Dextroscoliosis with additional more mild multilevel degenerative changes as above. Please see above report for a full description of these findings. 4. Age-related fatty atrophy within the paraspinous and  psoas musculature. Electronically Signed   By: Jeannine Boga M.D.   On: 09/15/2015 22:04     Time spent: 65 min  [facility] Pager [phone]  If 7PM-7AM, please contact night-coverage [email or web address] Password [facility] 09/17/2015, 3:30 PM

## 2015-09-17 NOTE — Progress Notes (Signed)
Subjective: Since March 15,she states that she is unable to move her [place]. When examining her she is not consistent in her Motor exam of her lowers. She has good sensation .Will order a MRI of her Thoracic Spine. MRI of Lumbar spine was not helpful. She had Spinal Surgery at [facility] years ago. She has had persistent Incontinence since that surgery.Will also have Neurology evaluate.  Objective: Vital signs in last 24 hours: Temp:  [97.9 F (36.6 C)-99.5 F (37.5 C)] 99.2 F (37.3 C) (03/22 0438) Pulse Rate:  [89-105] 92 (03/22 0438) Resp:  [15-16] 16 (03/22 0438) BP: (116-147)/(53-93) 120/67 mmHg (03/22 0438) SpO2:  [92 %-93 %] 92 % (03/22 0438) Weight:  [108.41 kg (239 lb)] 108.41 kg (239 lb) (03/21 2136)  Intake/Output from previous day: 03/21 0701 - 03/22 0700 In: 1610 [P.O.:960; I.V.:650] Out: -  Intake/Output this shift: Total I/O In: 240 [P.O.:240] Out: -    Recent Labs  09/15/15 1638 09/16/15 0433  HGB 10.3* 10.0*    Recent Labs  09/15/15 1638 09/16/15 0433  WBC 10.3 9.8  RBC 3.37* 3.27*  HCT 31.2* 31.8*  PLT 303 309    Recent Labs  09/15/15 1638 09/16/15 0433  NA 143 140  K 3.7 4.8  CL 104 102  CO2 28 28  BUN 50* 49*  CREATININE 1.80* 1.61*  GLUCOSE 132* 153*  CALCIUM 8.8* 8.6*    Recent Labs  09/16/15 0433 09/17/15 0358  INR 2.11* 2.45*    Scattered Motor deficit in her lowers.  Assessment/Plan: Will do MRI of her Thoracic Spine and get a Neurology consult.   Rhyann Berton A 09/17/2015, 10:07 AM

## 2015-09-18 DIAGNOSIS — I480 Paroxysmal atrial fibrillation: Secondary | ICD-10-CM

## 2015-09-18 LAB — SURGICAL PCR SCREEN
MRSA, PCR: NEGATIVE
STAPHYLOCOCCUS AUREUS: NEGATIVE

## 2015-09-18 LAB — GLUCOSE, CAPILLARY
Glucose-Capillary: 131 mg/dL — ABNORMAL HIGH (ref 65–99)
Glucose-Capillary: 145 mg/dL — ABNORMAL HIGH (ref 65–99)
Glucose-Capillary: 106 mg/dL — ABNORMAL HIGH (ref 65–99)
Glucose-Capillary: 152 mg/dL — ABNORMAL HIGH (ref 65–99)

## 2015-09-18 LAB — PROTIME-INR
INR: 1.74 — AB (ref 0.00–1.49)
PROTHROMBIN TIME: 20.3 s — AB (ref 11.6–15.2)

## 2015-09-18 MED ORDER — DEXTROSE 5 % IV SOLN
1.0000 g | INTRAVENOUS | Status: DC
  Administered 2015-09-18 – 2015-09-21 (×4): 1 g via INTRAVENOUS
  Filled 2015-09-18 (×6): qty 10

## 2015-09-18 MED ORDER — CEFAZOLIN SODIUM-DEXTROSE 2-4 GM/100ML-% IV SOLN
2.0000 g | INTRAVENOUS | Status: AC
  Administered 2015-09-19: 2 g via INTRAVENOUS

## 2015-09-18 MED ORDER — VITAMIN K1 10 MG/ML IJ SOLN
10.0000 mg | Freq: Every day | INTRAVENOUS | Status: DC
  Administered 2015-09-18: 10 mg via INTRAVENOUS
  Filled 2015-09-18 (×2): qty 1

## 2015-09-18 NOTE — Progress Notes (Signed)
No acute events INR 1.74 Neuro exam stable Will give more Vitamin K today Plan surgery for tomorrow at 0730 pending recheck of INR Discussed with patient, she's on board with this

## 2015-09-18 NOTE — Care Management Important Message (Signed)
Important Message  Patient Details  Name: Reita Manella MRN: [id number] Date of Birth: 10-23-1933   Medicare Important Message Given:  Yes    Tonee Silverstein P Franchelle Foskett 09/18/2015, 1:35 PM

## 2015-09-18 NOTE — Progress Notes (Signed)
[facility] PROGRESS NOTE  [name] [name] [id number] DOB: Apr 11, 1934 DOA: 09/15/2015 PCP: Gennette Pac, MD  Assessment/Plan:  Spondylosis with myelopathy- patient presented with spinal cord compression as per MRI, has been seen by neurosurgery. Plan is for T2-3, 3-4, and 5-6 laminectomies for decompression.  History of atrial fibrillation- Coumadin is currently hold. INR 1.73 this morning. Vitamin K 10 mg IV given this morning. We'll follow PT/INR in a.m.  Hyperlipidemia- continue Crestor  Hypertension- blood pressure is controlled, continue Cozaar.  Diabetes mellitus- blood glucose well controlled, continue sliding scale insulin with NovoLog.  UTI- patient has abnormal UA, will obtain urine culture stat. Start ceftriaxone 1 g IV every 24 hours.  UTI Code Status: Full code Family Communication: *No family present at bedside Disposition Plan: Pending surgery in a.m.   Consultants:  Neurosurgery  Procedures:  None  Antibiotics:  None  HPI/Subjective: Patient presented with the chief complaint of right leg pain and weakness. She was seen in the office two weeks ago with bilateral leg weakness. She reported that she had been fatigued because she had the flu. She did not have any neurological deficits when seen at the office 2 weeks ago. She was started on home PT but was unable to participate in more than one visit because of acute right leg pain. She reports that she went to the ED where they took x-rays of her pelvis and knees, showing no bony abnormalities or complications with her THAs and TKAs. Since being sent back home she has been unable to care for herself in anyway because of the pain and weakness. She presents with severe right leg pain and weakness with acute neuro deficits on exam. She has a history of a lumbar fusion in the 1980s by Dr. Agapito Games at [facility] spine clinic.   Patient denies any pain. Has been seen by neurosurgery. Plan is for  surgery in a.m.  Objective: Filed Vitals:   09/18/15 0554 09/18/15 1300  BP: 112/57 109/63  Pulse: 91   Temp: 98.4 F (36.9 C) 98.1 F (36.7 C)  Resp: 16 18    Intake/Output Summary (Last 24 hours) at 09/18/15 1403 Last data filed at 09/18/15 1300  Gross per 24 hour  Intake    240 ml  Output     50 ml  Net    190 ml   Filed Weights   09/16/15 2136  Weight: 108.41 kg (239 lb)    Exam:   General:  Appears in no acute distress  Cardiovascular: S1-S2 regular  Respiratory: Clear to auscultation bilaterally  Abdomen: Soft, nontender, no organomegaly  Musculoskeletal: No cyanosis/clubbing/edema of the lower extremities   Data Reviewed: Basic Metabolic Panel:  Recent Labs Lab 09/15/15 1638 09/16/15 0433  NA 143 140  K 3.7 4.8  CL 104 102  CO2 28 28  GLUCOSE 132* 153*  BUN 50* 49*  CREATININE 1.80* 1.61*  CALCIUM 8.8* 8.6*   Liver Function Tests:  Recent Labs Lab 09/15/15 1638 09/16/15 0433  AST 27 23  ALT 19 17  ALKPHOS 54 52  BILITOT 2.4* 2.5*  PROT 6.4* 6.2*  ALBUMIN 3.2* 3.0*   No results for input(s): LIPASE, AMYLASE in the last 168 hours. No results for input(s): AMMONIA in the last 168 hours. CBC:  Recent Labs Lab 09/15/15 1638 09/16/15 0433  WBC 10.3 9.8  NEUTROABS 7.6  --   HGB 10.3* 10.0*  HCT 31.2* 31.8*  MCV 92.6 97.2  PLT 303 309    CBG:  Recent Labs Lab 09/17/15 1852 09/17/15 2120 09/18/15 0637 09/18/15 1136  GLUCAP 101* 108* 131* 145*    Recent Results (from the past 240 hour(s))  Surgical PCR screen     Status: None   Collection Time: 09/18/15  6:23 AM  Result Value Ref Range Status   MRSA, PCR NEGATIVE NEGATIVE Final   Staphylococcus aureus NEGATIVE NEGATIVE Final    Comment:        The Xpert SA Assay (FDA approved for NASAL specimens in patients over 50 years of age), is one component of a comprehensive surveillance program.  Test performance has been validated by [facility] for patients  greater than or equal to 3 year old. It is not intended to diagnose infection nor to guide or monitor treatment.      Studies: Mr Thoracic Spine Wo Contrast  09/17/2015  ADDENDUM REPORT: 09/17/2015 13:04 ADDENDUM: Critical Value/emergent results were called by telephone at the time of interpretation on 09/17/2015 at 1:00 pm toDr. Kerney Elbe, who verbally acknowledged these results. Electronically Signed   By: Richardean Sale M.D.   On: 09/17/2015 13:04  09/17/2015  CLINICAL DATA:  Bilateral lower extremity weakness with limited ability to walk over the last week. Evaluate for cord compression. EXAM: MRI THORACIC SPINE WITHOUT CONTRAST TECHNIQUE: Multiplanar, multisequence MR imaging of the thoracic spine was performed. No intravenous contrast was administered. COMPARISON:  Lumbar MRI 09/15/2015.  Lumbar myelogram CT 12/08/2005. FINDINGS: Alignment:  Normal. Bones: The sagittal localizing images through the cervical spine demonstrate previous C4-6 ACDF. Patient is also status post thoracolumbar fusion extending from T12 through L5. Within the thoracic spine, no evidence of acute fracture or worrisome osseous lesion. There are endplate degenerative changes anteriorly at T3-4. There is also prominent adjacent segment disease at T11-12 with T2 hyperintensity in the disc, but no endplate destruction. The thoracic spinal canal is relatively small on a congenital basis. Cord: The CSF surrounding the cord is effaced at multiple levels. There is mild cord flattening with possible associated T2 hyperintensity at the T3-4 and T5-6 levels, best seen on the sagittal images. The distal thoracic cord and conus medullaris appear unchanged. Paraspinal and other soft tissues: No significant paraspinal abnormalities. Hiatal hernia noted. Disc levels: T1-2: Disc bulging with posterior osteophytes. No cord compression or high-grade foraminal narrowing. T2-3: Disc bulging with posterior osteophytes contributing to partial  effacement of the CSF surrounding the cord. There is no cord deformity. The left foramen appears mildly narrowed. T3-4: Disc degeneration with broad-based central disc protrusion and osteophytes. There is facet and ligamentous hypertrophy. These factors contribute to the effacement of the CSF surrounding the cord and mild cord flattening. The AP diameter of the canal is approximately 5 mm. There is right-greater-than-left foraminal narrowing. T4-5: Anterior osteophytes. No significant spinal stenosis or nerve root encroachment. T5-6: Moderate size central disc extrusion is best seen on the sagittal images, causing cord compression and narrowing the AP diameter of the canal to 4 mm. The foramina appear sufficiently patent. From T6-7 through T10-11, there is mild disc degeneration with anterior osteophytes, but no cord compression or significant foraminal compromise. T11-12: Adjacent segment disease with T2 hyperintensity in the disc and prominent anterior osteophytes. Mild bilateral facet hypertrophy. No cord deformity or significant foraminal compromise. T12-L1: Solid interbody fusion. No spinal stenosis or nerve root encroachment. IMPRESSION: 1. Two levels of probably symptomatic cord compression at T3-4 and T5-6. Findings are primarily secondary to spondylosis at T3-4 and a central disc extrusion at T5-6. At both levels,  there is mild cord flattening and possible abnormal cord signal which may reflect edema or myelomalacia. 2. Mild degenerative changes at the other levels as described without definite cord compression. 3. No acute osseous findings. 4. Previous cervical and thoracic fusion. 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 Electronically Signed: By: Richardean Sale M.D. On: 09/17/2015 12:52   Dg Chest Port 1 View  09/17/2015  CLINICAL DATA:  Leg weakness EXAM: PORTABLE CHEST 1 VIEW COMPARISON:  10/29/2008 FINDINGS: Normal heart size. Lungs clear. No pneumothorax. No pleural effusion. Severe chronic changes at  both shoulder joints with chronic rotator cuff tearing. IMPRESSION: No active disease. Electronically Signed   By: Marybelle Killings M.D.   On: 09/17/2015 15:54    Scheduled Meds: . bisoprolol-hydrochlorothiazide  1 tablet Oral BID  . [START ON 09/19/2015]  ceFAZolin (ANCEF) IV  2 g Intravenous To SS-Surg  . cholecalciferol  2,000 Units Oral Daily  . cyclobenzaprine  10 mg Oral QHS  . docusate sodium  100 mg Oral BID  . insulin aspart  0-5 Units Subcutaneous QHS  . insulin aspart  0-9 Units Subcutaneous TID WC  . losartan  100 mg Oral Daily  . oxybutynin  5 mg Oral BID  . phytonadione (VITAMIN K) IV  10 mg Intravenous Daily  . rosuvastatin  10 mg Oral Q M,W,F-2000  . sodium chloride flush  3 mL Intravenous Q12H   Continuous Infusions: . sodium chloride 75 mL/hr at 09/17/15 2302    Active Problems:   Atrial fibrillation [facility])   History of lumbar fusion   Right foot drop   Cord compression [facility])    Time spent: 25 min    [place] [facility] Pager [phone]. If 7PM-7AM, please contact night-coverage at [email or web address], password [facility] 09/18/2015, 2:03 PM  LOS: 3 days

## 2015-09-19 ENCOUNTER — Encounter (HOSPITAL_COMMUNITY): Payer: Self-pay | Admitting: Certified Registered"

## 2015-09-19 ENCOUNTER — Inpatient Hospital Stay (HOSPITAL_COMMUNITY): Payer: Medicare Other | Admitting: Certified Registered"

## 2015-09-19 ENCOUNTER — Ambulatory Visit: Payer: Self-pay | Admitting: *Deleted

## 2015-09-19 ENCOUNTER — Other Ambulatory Visit: Payer: Self-pay | Admitting: *Deleted

## 2015-09-19 ENCOUNTER — Encounter (HOSPITAL_COMMUNITY)
Admission: AD | Disposition: A | Discharge status: Skilled Nursing Facility | Payer: Self-pay | Source: Ambulatory Visit | Attending: Family Medicine

## 2015-09-19 ENCOUNTER — Inpatient Hospital Stay (HOSPITAL_COMMUNITY): Payer: Medicare Other

## 2015-09-19 HISTORY — PX: THORACIC DISCECTOMY: SHX6113

## 2015-09-19 LAB — CBC
HCT: 31.9 % — ABNORMAL LOW (ref 36.0–46.0)
Hemoglobin: 10 g/dL — ABNORMAL LOW (ref 12.0–15.0)
MCH: 29.8 pg (ref 26.0–34.0)
MCHC: 31.3 g/dL (ref 30.0–36.0)
MCV: 94.9 fL (ref 78.0–100.0)
PLATELETS: 296 10*3/uL (ref 150–400)
RBC: 3.36 MIL/uL — ABNORMAL LOW (ref 3.87–5.11)
RDW: 15.5 % (ref 11.5–15.5)
WBC: 8.9 10*3/uL (ref 4.0–10.5)

## 2015-09-19 LAB — BASIC METABOLIC PANEL
ANION GAP: 7 (ref 5–15)
BUN: 31 mg/dL — AB (ref 6–20)
CALCIUM: 8.3 mg/dL — AB (ref 8.9–10.3)
CO2: 27 mmol/L (ref 22–32)
Chloride: 106 mmol/L (ref 101–111)
Creatinine, Ser: 1.05 mg/dL — ABNORMAL HIGH (ref 0.44–1.00)
GFR calc Af Amer: 57 mL/min — ABNORMAL LOW (ref >60)
GFR, EST NON AFRICAN AMERICAN: 49 mL/min — AB (ref >60)
GLUCOSE: 138 mg/dL — AB (ref 65–99)
Potassium: 4.2 mmol/L (ref 3.5–5.1)
Sodium: 140 mmol/L (ref 135–145)

## 2015-09-19 LAB — GLUCOSE, CAPILLARY
GLUCOSE-CAPILLARY: 103 mg/dL — AB (ref 65–99)
Glucose-Capillary: 90 mg/dL (ref 65–99)
Glucose-Capillary: 116 mg/dL — ABNORMAL HIGH (ref 65–99)
Glucose-Capillary: 134 mg/dL — ABNORMAL HIGH (ref 65–99)

## 2015-09-19 LAB — PROTIME-INR
INR: 1.31 (ref 0.00–1.49)
Prothrombin Time: 16.4 seconds — ABNORMAL HIGH (ref 11.6–15.2)

## 2015-09-19 SURGERY — THORACIC DISCECTOMY
Anesthesia: General | Site: Back

## 2015-09-19 MED ORDER — METHOCARBAMOL 1000 MG/10ML IJ SOLN
500.0000 mg | Freq: Four times a day (QID) | INTRAVENOUS | Status: DC | PRN
  Filled 2015-09-19: qty 5

## 2015-09-19 MED ORDER — THROMBIN 5000 UNITS EX SOLR
OROMUCOSAL | Status: DC | PRN
  Administered 2015-09-19: 10 mL via TOPICAL

## 2015-09-19 MED ORDER — ZOLPIDEM TARTRATE 5 MG PO TABS
5.0000 mg | ORAL_TABLET | Freq: Every evening | ORAL | Status: DC | PRN
  Administered 2015-09-23: 5 mg via ORAL
  Filled 2015-09-19: qty 1

## 2015-09-19 MED ORDER — ARTIFICIAL TEARS OP OINT
TOPICAL_OINTMENT | OPHTHALMIC | Status: AC
  Filled 2015-09-19: qty 3.5

## 2015-09-19 MED ORDER — FENTANYL CITRATE (PF) 100 MCG/2ML IJ SOLN: 25.0000 ug | INTRAMUSCULAR | Status: DC | PRN

## 2015-09-19 MED ORDER — HYDROMORPHONE HCL 1 MG/ML IJ SOLN
0.5000 mg | INTRAMUSCULAR | Status: DC | PRN
  Administered 2015-09-19 – 2015-09-21 (×3): 1 mg via INTRAVENOUS

## 2015-09-19 MED ORDER — ACETAMINOPHEN 325 MG PO TABS
650.0000 mg | ORAL_TABLET | ORAL | Status: DC | PRN
  Administered 2015-09-21: 650 mg via ORAL
  Filled 2015-09-19: qty 2

## 2015-09-19 MED ORDER — SODIUM CHLORIDE 0.9 % IV SOLN
INTRAVENOUS | Status: DC
Start: 2015-09-19 — End: 2015-09-23

## 2015-09-19 MED ORDER — PHENYLEPHRINE 40 MCG/ML (10ML) SYRINGE FOR IV PUSH (FOR BLOOD PRESSURE SUPPORT)
PREFILLED_SYRINGE | INTRAVENOUS | Status: AC
  Filled 2015-09-19: qty 10

## 2015-09-19 MED ORDER — CEFAZOLIN SODIUM 1-5 GM-% IV SOLN
1.0000 g | Freq: Three times a day (TID) | INTRAVENOUS | Status: AC
  Administered 2015-09-19 – 2015-09-20 (×2): 1 g via INTRAVENOUS
  Filled 2015-09-19 (×3): qty 50

## 2015-09-19 MED ORDER — SENNA 8.6 MG PO TABS
1.0000 | ORAL_TABLET | Freq: Two times a day (BID) | ORAL | Status: DC
  Administered 2015-09-19 – 2015-09-23 (×9): 8.6 mg via ORAL
  Filled 2015-09-19 (×9): qty 1

## 2015-09-19 MED ORDER — SODIUM CHLORIDE 0.9 % IR SOLN
Status: DC | PRN
  Administered 2015-09-19: 500 mL

## 2015-09-19 MED ORDER — SODIUM CHLORIDE 0.9% FLUSH: 3.0000 mL | INTRAVENOUS | Status: DC | PRN

## 2015-09-19 MED ORDER — ONDANSETRON HCL 4 MG/2ML IJ SOLN
INTRAMUSCULAR | Status: DC | PRN
  Administered 2015-09-19: 4 mg via INTRAVENOUS

## 2015-09-19 MED ORDER — VANCOMYCIN HCL 1000 MG IV SOLR
INTRAVENOUS | Status: AC
  Filled 2015-09-19: qty 1000

## 2015-09-19 MED ORDER — LIDOCAINE HCL (CARDIAC) 20 MG/ML IV SOLN
INTRAVENOUS | Status: DC | PRN
  Administered 2015-09-19: 100 mg via INTRAVENOUS

## 2015-09-19 MED ORDER — THROMBIN 5000 UNITS EX SOLR
CUTANEOUS | Status: DC | PRN
  Administered 2015-09-19 (×2): 5000 [IU] via TOPICAL

## 2015-09-19 MED ORDER — ROCURONIUM BROMIDE 100 MG/10ML IV SOLN
INTRAVENOUS | Status: DC | PRN
  Administered 2015-09-19: 50 mg via INTRAVENOUS

## 2015-09-19 MED ORDER — BISACODYL 5 MG PO TBEC
5.0000 mg | DELAYED_RELEASE_TABLET | Freq: Every day | ORAL | Status: DC | PRN
  Administered 2015-09-19: 5 mg via ORAL
  Filled 2015-09-19: qty 1

## 2015-09-19 MED ORDER — ALBUMIN HUMAN 5 % IV SOLN
INTRAVENOUS | Status: DC | PRN
Start: 2015-09-19 — End: 2015-09-19
  Administered 2015-09-19: 09:00:00 via INTRAVENOUS

## 2015-09-19 MED ORDER — SUGAMMADEX SODIUM 200 MG/2ML IV SOLN
INTRAVENOUS | Status: DC | PRN
  Administered 2015-09-19: 200 mg via INTRAVENOUS

## 2015-09-19 MED ORDER — MENTHOL 3 MG MT LOZG: 1.0000 | LOZENGE | OROMUCOSAL | Status: DC | PRN

## 2015-09-19 MED ORDER — BUPIVACAINE LIPOSOME 1.3 % IJ SUSP
INTRAMUSCULAR | Status: DC | PRN
  Administered 2015-09-19: 20 mL

## 2015-09-19 MED ORDER — ARTIFICIAL TEARS OP OINT
TOPICAL_OINTMENT | OPHTHALMIC | Status: DC | PRN
  Administered 2015-09-19: 1 via OPHTHALMIC

## 2015-09-19 MED ORDER — METHOCARBAMOL 500 MG PO TABS
500.0000 mg | ORAL_TABLET | Freq: Four times a day (QID) | ORAL | Status: DC | PRN
  Administered 2015-09-19: 500 mg via ORAL
  Filled 2015-09-19: qty 1

## 2015-09-19 MED ORDER — SODIUM CHLORIDE 0.9 % IV SOLN
250.0000 mL | INTRAVENOUS | Status: DC
Start: 2015-09-19 — End: 2015-09-23

## 2015-09-19 MED ORDER — FENTANYL CITRATE (PF) 100 MCG/2ML IJ SOLN
INTRAMUSCULAR | Status: DC | PRN
  Administered 2015-09-19: 50 ug via INTRAVENOUS
  Administered 2015-09-19: 150 ug via INTRAVENOUS

## 2015-09-19 MED ORDER — PROPOFOL 10 MG/ML IV BOLUS
INTRAVENOUS | Status: DC | PRN
  Administered 2015-09-19: 130 mg via INTRAVENOUS

## 2015-09-19 MED ORDER — FLEET ENEMA 7-19 GM/118ML RE ENEM: 1.0000 | ENEMA | Freq: Once | RECTAL | Status: DC | PRN

## 2015-09-19 MED ORDER — ROCURONIUM BROMIDE 50 MG/5ML IV SOLN
INTRAVENOUS | Status: AC
  Filled 2015-09-19: qty 1

## 2015-09-19 MED ORDER — SODIUM CHLORIDE 0.9% FLUSH
3.0000 mL | Freq: Two times a day (BID) | INTRAVENOUS | Status: DC
  Administered 2015-09-19 – 2015-09-22 (×4): 3 mL via INTRAVENOUS

## 2015-09-19 MED ORDER — LIDOCAINE HCL (CARDIAC) 20 MG/ML IV SOLN
INTRAVENOUS | Status: AC
  Filled 2015-09-19: qty 5

## 2015-09-19 MED ORDER — FENTANYL CITRATE (PF) 250 MCG/5ML IJ SOLN
INTRAMUSCULAR | Status: AC
  Filled 2015-09-19: qty 5

## 2015-09-19 MED ORDER — LIDOCAINE-EPINEPHRINE 2 %-1:100000 IJ SOLN
INTRAMUSCULAR | Status: DC | PRN
  Administered 2015-09-19: 5 mL via INTRADERMAL

## 2015-09-19 MED ORDER — PANTOPRAZOLE SODIUM 40 MG IV SOLR
40.0000 mg | Freq: Every day | INTRAVENOUS | Status: DC
  Administered 2015-09-19 – 2015-09-20 (×2): 40 mg via INTRAVENOUS
  Filled 2015-09-19 (×2): qty 40

## 2015-09-19 MED ORDER — PHENYLEPHRINE HCL 10 MG/ML IJ SOLN
10.0000 mg | INTRAMUSCULAR | Status: DC | PRN
  Administered 2015-09-19: 50 ug/min via INTRAVENOUS

## 2015-09-19 MED ORDER — BUPIVACAINE LIPOSOME 1.3 % IJ SUSP
20.0000 mL | INTRAMUSCULAR | Status: DC
  Filled 2015-09-19: qty 20

## 2015-09-19 MED ORDER — SUGAMMADEX SODIUM 200 MG/2ML IV SOLN
INTRAVENOUS | Status: AC
  Filled 2015-09-19: qty 2

## 2015-09-19 MED ORDER — HYDROCODONE-ACETAMINOPHEN 5-325 MG PO TABS
1.0000 | ORAL_TABLET | ORAL | Status: DC | PRN
  Administered 2015-09-20 – 2015-09-21 (×2): 2 via ORAL
  Filled 2015-09-19 (×2): qty 2

## 2015-09-19 MED ORDER — OXYCODONE-ACETAMINOPHEN 5-325 MG PO TABS
1.0000 | ORAL_TABLET | ORAL | Status: DC | PRN
Start: 2015-09-19 — End: 2015-09-23
  Administered 2015-09-19 – 2015-09-20 (×3): 2 via ORAL
  Administered 2015-09-22: 1 via ORAL
  Filled 2015-09-19: qty 1

## 2015-09-19 MED ORDER — 0.9 % SODIUM CHLORIDE (POUR BTL) OPTIME
TOPICAL | Status: DC | PRN
  Administered 2015-09-19: 1000 mL

## 2015-09-19 MED ORDER — BUPIVACAINE HCL (PF) 0.25 % IJ SOLN
INTRAMUSCULAR | Status: DC | PRN
  Administered 2015-09-19: 5 mL

## 2015-09-19 MED ORDER — PHENYLEPHRINE HCL 10 MG/ML IJ SOLN
INTRAMUSCULAR | Status: DC | PRN
  Administered 2015-09-19 (×2): 80 ug via INTRAVENOUS
  Administered 2015-09-19: 120 ug via INTRAVENOUS

## 2015-09-19 MED ORDER — DEXTROSE 5 % IV SOLN
10.0000 mg | INTRAVENOUS | Status: DC
  Filled 2015-09-19: qty 1

## 2015-09-19 MED ORDER — ACETAMINOPHEN 650 MG RE SUPP: 650.0000 mg | RECTAL | Status: DC | PRN

## 2015-09-19 MED ORDER — LACTATED RINGERS IV SOLN
INTRAVENOUS | Status: DC | PRN
  Administered 2015-09-19 (×2): via INTRAVENOUS

## 2015-09-19 MED ORDER — CEFAZOLIN SODIUM-DEXTROSE 2-4 GM/100ML-% IV SOLN
INTRAVENOUS | Status: AC
  Filled 2015-09-19: qty 100

## 2015-09-19 MED ORDER — PHENOL 1.4 % MT LIQD: 1.0000 | OROMUCOSAL | Status: DC | PRN

## 2015-09-19 MED ORDER — ONDANSETRON HCL 4 MG/2ML IJ SOLN: 4.0000 mg | INTRAMUSCULAR | Status: DC | PRN

## 2015-09-19 MED ORDER — DOCUSATE SODIUM 100 MG PO CAPS: 100.0000 mg | ORAL_CAPSULE | Freq: Two times a day (BID) | ORAL | Status: DC

## 2015-09-19 MED ORDER — ONDANSETRON HCL 4 MG/2ML IJ SOLN
INTRAMUSCULAR | Status: AC
  Filled 2015-09-19: qty 2

## 2015-09-19 MED ORDER — PROPOFOL 10 MG/ML IV BOLUS
INTRAVENOUS | Status: AC
  Filled 2015-09-19: qty 20

## 2015-09-19 MED ORDER — VANCOMYCIN HCL 1000 MG IV SOLR
INTRAVENOUS | Status: DC | PRN
  Administered 2015-09-19: 1000 mg

## 2015-09-19 MED ORDER — VITAMIN K1 10 MG/ML IJ SOLN
10.0000 mg | INTRAMUSCULAR | Status: AC
  Administered 2015-09-19: 10 mg via INTRAVENOUS
  Filled 2015-09-19: qty 1

## 2015-09-19 MED ORDER — HEMOSTATIC AGENTS (NO CHARGE) OPTIME
TOPICAL | Status: DC | PRN
  Administered 2015-09-19: 1 via TOPICAL

## 2015-09-19 SURGICAL SUPPLY — 63 items
BAG DECANTER FOR FLEXI CONT (MISCELLANEOUS) ×3 IMPLANT
BENZOIN TINCTURE PRP APPL 2/3 (GAUZE/BANDAGES/DRESSINGS) IMPLANT
BIT DRILL NEURO 2X3.1 SFT TUCH (MISCELLANEOUS) ×1 IMPLANT
BLADE CLIPPER SURG (BLADE) IMPLANT
BLADE SURG 11 STRL SS (BLADE) ×3 IMPLANT
BUR MATCHSTICK NEURO 3.0X3.8 (BURR) ×3 IMPLANT
BUR ROUND FLUTED 5 RND (BURR) ×2 IMPLANT
BUR ROUND FLUTED 5MM RND (BURR) ×1
CANISTER SUCT 3000ML PPV (MISCELLANEOUS) ×6 IMPLANT
CHLORAPREP W/TINT 26ML (MISCELLANEOUS) ×3 IMPLANT
CLOSURE WOUND 1/2 X4 (GAUZE/BANDAGES/DRESSINGS)
DECANTER SPIKE VIAL GLASS SM (MISCELLANEOUS) ×3 IMPLANT
DERMABOND ADVANCED (GAUZE/BANDAGES/DRESSINGS) ×2
DERMABOND ADVANCED .7 DNX12 (GAUZE/BANDAGES/DRESSINGS) ×1 IMPLANT
DRAPE MICROSCOPE LEICA (MISCELLANEOUS) ×3 IMPLANT
DRAPE POUCH INSTRU U-SHP 10X18 (DRAPES) ×3 IMPLANT
DRAPE SURG 17X23 STRL (DRAPES) ×3 IMPLANT
DRILL NEURO 2X3.1 SOFT TOUCH (MISCELLANEOUS) ×3
DRSG OPSITE 4X5.5 SM (GAUZE/BANDAGES/DRESSINGS) ×3 IMPLANT
DRSG OPSITE POSTOP 4X10 (GAUZE/BANDAGES/DRESSINGS) ×3 IMPLANT
DRSG OPSITE POSTOP 4X6 (GAUZE/BANDAGES/DRESSINGS) ×3 IMPLANT
ELECT REM PT RETURN 9FT ADLT (ELECTROSURGICAL) ×3
ELECTRODE REM PT RTRN 9FT ADLT (ELECTROSURGICAL) ×1 IMPLANT
EVACUATOR 1/8 PVC DRAIN (DRAIN) ×3 IMPLANT
GAUZE SPONGE 4X4 12PLY STRL (GAUZE/BANDAGES/DRESSINGS) IMPLANT
GAUZE SPONGE 4X4 16PLY XRAY LF (GAUZE/BANDAGES/DRESSINGS) IMPLANT
GLOVE BIOGEL PI IND STRL 7.5 (GLOVE) ×1 IMPLANT
GLOVE BIOGEL PI INDICATOR 7.5 (GLOVE) ×2
GLOVE EXAM NITRILE LRG STRL (GLOVE) IMPLANT
GLOVE EXAM NITRILE MD LF STRL (GLOVE) IMPLANT
GLOVE EXAM NITRILE XL STR (GLOVE) IMPLANT
GLOVE EXAM NITRILE XS STR PU (GLOVE) IMPLANT
GLOVE SS BIOGEL STRL SZ 7 (GLOVE) ×2 IMPLANT
GLOVE SUPERSENSE BIOGEL SZ 7 (GLOVE) ×4
GOWN STRL REUS W/ TWL LRG LVL3 (GOWN DISPOSABLE) ×1 IMPLANT
GOWN STRL REUS W/ TWL XL LVL3 (GOWN DISPOSABLE) IMPLANT
GOWN STRL REUS W/TWL LRG LVL3 (GOWN DISPOSABLE) ×2
GOWN STRL REUS W/TWL XL LVL3 (GOWN DISPOSABLE)
HEMOSTAT POWDER KIT SURGIFOAM (HEMOSTASIS) ×3 IMPLANT
KIT BASIN OR (CUSTOM PROCEDURE TRAY) ×3 IMPLANT
KIT ROOM TURNOVER OR (KITS) ×3 IMPLANT
NEEDLE HYPO 21X1.5 SAFETY (NEEDLE) ×3 IMPLANT
NEEDLE HYPO 25X1 1.5 SAFETY (NEEDLE) ×3 IMPLANT
NS IRRIG 1000ML POUR BTL (IV SOLUTION) ×3 IMPLANT
PACK LAMINECTOMY NEURO (CUSTOM PROCEDURE TRAY) ×3 IMPLANT
PACK UNIVERSAL I (CUSTOM PROCEDURE TRAY) ×3 IMPLANT
PAD ARMBOARD 7.5X6 YLW CONV (MISCELLANEOUS) ×9 IMPLANT
PATTIES SURGICAL .5X1.5 (GAUZE/BANDAGES/DRESSINGS) ×3 IMPLANT
RUBBERBAND STERILE (MISCELLANEOUS) ×6 IMPLANT
SPONGE SURGIFOAM ABS GEL SZ50 (HEMOSTASIS) ×3 IMPLANT
STAPLER VISISTAT 35W (STAPLE) ×3 IMPLANT
STRIP CLOSURE SKIN 1/2X4 (GAUZE/BANDAGES/DRESSINGS) IMPLANT
SUT VIC AB 0 CT1 18XCR BRD8 (SUTURE) ×2 IMPLANT
SUT VIC AB 0 CT1 8-18 (SUTURE) ×4
SUT VIC AB 2-0 CT1 18 (SUTURE) ×6 IMPLANT
SUT VIC AB 3-0 SH 8-18 (SUTURE) ×6 IMPLANT
SUT VIC AB 4-0 PS2 27 (SUTURE) ×3 IMPLANT
SYR 30ML LL (SYRINGE) ×3 IMPLANT
TOWEL OR 17X24 6PK STRL BLUE (TOWEL DISPOSABLE) ×3 IMPLANT
TOWEL OR 17X26 10 PK STRL BLUE (TOWEL DISPOSABLE) ×3 IMPLANT
TUBE CONNECTING 12'X1/4 (SUCTIONS) ×1
TUBE CONNECTING 12X1/4 (SUCTIONS) ×2 IMPLANT
WATER STERILE IRR 1000ML POUR (IV SOLUTION) ×3 IMPLANT

## 2015-09-19 NOTE — Anesthesia Postprocedure Evaluation (Signed)
Anesthesia Post Note  Patient: [name] [name]  Procedure(s) Performed: Procedure(s) (LRB): THORACIC LAMINECTOMY DECOMPRESSION T2-3, T3-4,T5-6 (N/A)  Patient location during evaluation: PACU Anesthesia Type: General Level of consciousness: awake and alert Pain management: pain level controlled Vital Signs Assessment: post-procedure vital signs reviewed and stable Respiratory status: spontaneous breathing, nonlabored ventilation, respiratory function stable and patient connected to nasal cannula oxygen Cardiovascular status: blood pressure returned to baseline and stable Postop Assessment: no signs of nausea or vomiting Anesthetic complications: no    Last Vitals:  Filed Vitals:   09/19/15 1030 09/19/15 1045  BP: 90/60 95/60  Pulse:    Temp:  36.1 C  Resp: 15 14    Last Pain:  Filed Vitals:   09/19/15 1046  PainSc: 0-No pain                 [name] J

## 2015-09-19 NOTE — Brief Op Note (Signed)
09/15/2015 - 09/19/2015  10:01 AM  PATIENT:  [name] [name]  82 y.o. female  PRE-OPERATIVE DIAGNOSIS:  Thoracic myelopathy  POST-OPERATIVE DIAGNOSIS:  Thoracic myelopathy  PROCEDURE:  Procedure(s) with comments: THORACIC LAMINECTOMY DECOMPRESSION T2-3, T3-4,T5-6 (N/A) - THORACIC LAMINECTOMY DECOMPRESSION T2-3, T3-4,T5-6  SURGEON:  Surgeon(s) and Role:    * Tamala Fothergill, MD - Primary    * Consuella Lose, MD - Assisting  PHYSICIAN ASSISTANT:   ASSISTANTS: Consuella Lose, MD  ANESTHESIA:   general  EBL:  Total I/O In: 1850 [I.V.:1600; IV Piggyback:250] Out: 1050 [Urine:900; Blood:150]  BLOOD ADMINISTERED:none  DRAINS:Medium hemovac  LOCAL MEDICATIONS USED:  MARCAINE    and LIDOCAINE   SPECIMEN:  No Specimen  DISPOSITION OF SPECIMEN:  N/A  COUNTS:  YES  TOURNIQUET:  * No tourniquets in log *  DICTATION: .Dragon Dictation  PLAN OF CARE: Admit to inpatient   PATIENT DISPOSITION:  PACU - hemodynamically stable.   Delay start of Pharmacological VTE agent (>24hrs) due to surgical blood loss or risk of bleeding: yes

## 2015-09-19 NOTE — Progress Notes (Signed)
No acute events AVSS Awake and alert Right HF 4-/5, otherwise strength unchanged Slightly worse hip flexor strength from yesterday To OR this morning for thoracic decompression

## 2015-09-19 NOTE — Progress Notes (Signed)
[facility] PROGRESS NOTE  [name] [name] [id number] DOB: [birth year] DOA: 09/15/2015 PCP: Gennette Pac, MD  Assessment/Plan:  Spondylosis with myelopathy- patient presented with spinal cord compression as per MRI, has been seen by neurosurgery. Patient underwent  T2-3, 3-4, and 5-6 laminectomies for decompression.  History of atrial fibrillation- coumadin is on hold.  Hyperlipidemia- continue Crestor  Hypertension- blood pressure is controlled, continue Cozaar.  Diabetes mellitus- blood glucose well controlled, continue sliding scale insulin with NovoLog.  UTI- patient has abnormal UA, will obtain urine culture. Start ceftriaxone 1 g IV every 24 hours.   Code Status: Full code Family Communication: *No family present at bedside Disposition Plan: Pending surgery in a.m.   Consultants:  Neurosurgery  Procedures:  None  Antibiotics:  None  HPI/Subjective: Patient presented with the chief complaint of right leg pain and weakness. She was seen in the office two weeks ago with bilateral leg weakness. She reported that she had been fatigued because she had the flu. She did not have any neurological deficits when seen at the office 2 weeks ago. She was started on home PT but was unable to participate in more than one visit because of acute right leg pain. She reports that she went to the ED where they took x-rays of her pelvis and knees, showing no bony abnormalities or complications with her THAs and TKAs. Since being sent back home she has been unable to care for herself in anyway because of the pain and weakness. She presents with severe right leg pain and weakness with acute neuro deficits on exam. She has a history of a lumbar fusion in the 1980s by Dr. Agapito Games at [facility] spine clinic.   Patient denies any pain. Patient is s/p surgery.  Objective: Filed Vitals:   09/19/15 1104 09/19/15 1433  BP: 103/52 123/55  Pulse: 109 75  Temp: 97.4 F (36.3 C)  97.5 F (36.4 C)  Resp: 16 16    Intake/Output Summary (Last 24 hours) at 09/19/15 1538 Last data filed at 09/19/15 1044  Gross per 24 hour  Intake   2825 ml  Output   1150 ml  Net   1675 ml   Filed Weights   09/16/15 2136  Weight: 108.41 kg (239 lb)    Exam:   General:  Appears in no acute distress  Cardiovascular: S1-S2 regular  Respiratory: Clear to auscultation bilaterally  Abdomen: Soft, nontender, no organomegaly  Musculoskeletal: No cyanosis/clubbing/edema of the lower extremities   Data Reviewed: Basic Metabolic Panel:  Recent Labs Lab 09/15/15 1638 09/16/15 0433 09/19/15 0523  NA 143 140 140  K 3.7 4.8 4.2  CL 104 102 106  CO2 28 28 27   GLUCOSE 132* 153* 138*  BUN 50* 49* 31*  CREATININE 1.80* 1.61* 1.05*  CALCIUM 8.8* 8.6* 8.3*   Liver Function Tests:  Recent Labs Lab 09/15/15 1638 09/16/15 0433  AST 27 23  ALT 19 17  ALKPHOS 54 52  BILITOT 2.4* 2.5*  PROT 6.4* 6.2*  ALBUMIN 3.2* 3.0*   No results for input(s): LIPASE, AMYLASE in the last 168 hours. No results for input(s): AMMONIA in the last 168 hours. CBC:  Recent Labs Lab 09/15/15 1638 09/16/15 0433 09/19/15 0523  WBC 10.3 9.8 8.9  NEUTROABS 7.6  --   --   HGB 10.3* 10.0* 10.0*  HCT 31.2* 31.8* 31.9*  MCV 92.6 97.2 94.9  PLT 303 309 296    CBG:  Recent Labs Lab 09/18/15 1136 09/18/15 1628 09/18/15 2145  09/19/15 0644 09/19/15 1259  GLUCAP 145* 106* 152* 90 116*    Recent Results (from the past 240 hour(s))  Surgical PCR screen     Status: None   Collection Time: 09/18/15  6:23 AM  Result Value Ref Range Status   MRSA, PCR NEGATIVE NEGATIVE Final   Staphylococcus aureus NEGATIVE NEGATIVE Final    Comment:        The Xpert SA Assay (FDA approved for NASAL specimens in patients over 64 years of age), is one component of a comprehensive surveillance program.  Test performance has been validated by [facility] for patients greater than or equal to 46 year  old. It is not intended to diagnose infection nor to guide or monitor treatment.      Studies: Dg Cervical Spine 2-3 Views  09/19/2015  CLINICAL DATA:  T2-T3 thoracic laminectomy. EXAM: CERVICAL SPINE - 2-3 VIEW COMPARISON:  None. FINDINGS: Two intraoperative images were submitted. On image number 1, there is a surgical marker along the posterior aspect of the cervicothoracic junction. On the second image, there are surgical instruments in the region of T2-T3 IMPRESSION: Surgical marking in the region of T2-T3. Electronically Signed   By: Markus Daft M.D.   On: 09/19/2015 09:57   Dg Chest Port 1 View  09/17/2015  CLINICAL DATA:  Leg weakness EXAM: PORTABLE CHEST 1 VIEW COMPARISON:  10/29/2008 FINDINGS: Normal heart size. Lungs clear. No pneumothorax. No pleural effusion. Severe chronic changes at both shoulder joints with chronic rotator cuff tearing. IMPRESSION: No active disease. Electronically Signed   By: Marybelle Killings M.D.   On: 09/17/2015 15:54    Scheduled Meds: . bisoprolol-hydrochlorothiazide  1 tablet Oral BID  . ceFAZolin      .  ceFAZolin (ANCEF) IV  1 g Intravenous Q8H  . cefTRIAXone (ROCEPHIN)  IV  1 g Intravenous Q24H  . cholecalciferol  2,000 Units Oral Daily  . cyclobenzaprine  10 mg Oral QHS  . docusate sodium  100 mg Oral BID  . insulin aspart  0-5 Units Subcutaneous QHS  . insulin aspart  0-9 Units Subcutaneous TID WC  . losartan  100 mg Oral Daily  . oxybutynin  5 mg Oral BID  . pantoprazole (PROTONIX) IV  40 mg Intravenous QHS  . rosuvastatin  10 mg Oral Q M,W,F-2000  . senna  1 tablet Oral BID  . sodium chloride flush  3 mL Intravenous Q12H  . sodium chloride flush  3 mL Intravenous Q12H  . vancomycin       Continuous Infusions: . sodium chloride 75 mL/hr at 09/18/15 2024  . sodium chloride    . sodium chloride      Active Problems:   Atrial fibrillation (HCC)   History of lumbar fusion   Right foot drop   Cord compression ([place])    Time spent: 25  min    [place] [facility] Pager [phone]. If 7PM-7AM, please contact night-coverage at [email or web address], password [facility] 09/19/2015, 3:38 PM  LOS: 4 days

## 2015-09-19 NOTE — Op Note (Signed)
09/15/2015 - 09/19/2015  11:09 AM  PATIENT:  [name] [name]  82 y.o. female  PRE-OPERATIVE DIAGNOSIS:  Thoracic myelopathy, thoracic stenosis T2-3, T3-4, T5-6  POST-OPERATIVE DIAGNOSIS:  same  PROCEDURE:  Thoracic laminectomies for decompression, T2-T3, T3-T4, T5-T6  SURGEON:  Aldean Ast, MD  ASSISTANTS: Consuella Lose, MD  ANESTHESIA:   General  DRAINS: Medium hemovac  SPECIMEN:  None  INDICATION FOR PROCEDURE: 82 year old Caucasian woman with multiple medical problems who presents with progressive difficulty ambulating. She had a thoracic MRI which showed multiple levels of  Stenosis with spinal cord compression. I recommended T2-T3, T3-T4, T5-T6 laminectomies for decompression of her spinal cord.  Patient understood the risks, benefits, and alternatives and potential outcomes and wished to proceed.  PROCEDURE DETAILS: After smooth induction of general endotracheal anesthesia the patient was turned prone on a Wilson frame on a flat Jackson table.  Her skin was wiped down with alcohol and anesthetized with a mixture of lidocaine and Marcaine with epinephrine.  A preoperative localizing film was taken which identified the C7 and T1 spinous processes.  The skin was prepped and draped in the usual sterile fashion.  The skin was opened for approximately the bottom of the T1 spinous process to the bottom of the T6 spinous process.  Monopolar cautery was used to dissect through the soft tissues.  Subperiosteal dissection was performed to expose the lamina from T2 to T6.  A second localizing film was taken which identified the T2 spinous process.  Using high-speed drill, Leksell and Kerrison rongeurs and laminectomies were performed at T to T3, T3-T4 and T5-T6.  Hypertrophy ligamentum flavum was resected.  At T3-T4 there is substantial bony overgrowth from the underside of the lamina that was significantly indenting the thecal sac.  I obtained hemostasis.  I placed lengthwise in  pattern in the depths of the wound.  I closed the wound in routine anatomic layers using interrupted Vicryl sutures.  I irrigated at each layer and placed manually spattered each layer.  I injected Exparel in the subcutaneous tissues.  The skin was closed with staples.  The patient was returned to the supine position on the stretcher and taken to PACU after awakening from anesthesia uneventfully.  PATIENT DISPOSITION:  PACU - hemodynamically stable.   Delay start of Pharmacological VTE agent (>24hrs) due to surgical blood loss or risk of bleeding:  yes

## 2015-09-19 NOTE — Anesthesia Procedure Notes (Signed)
Procedure Name: Intubation Date/Time: 09/19/2015 7:40 AM Performed by: Gaylene Brooks Pre-anesthesia Checklist: Patient identified, Timeout performed, Emergency Drugs available, Suction available and Patient being monitored Patient Re-evaluated:Patient Re-evaluated prior to inductionOxygen Delivery Method: Circle system utilized Preoxygenation: Pre-oxygenation with 100% oxygen Intubation Type: IV induction Ventilation: Mask ventilation without difficulty Laryngoscope Size: Miller and 2 Grade View: Grade I Tube type: Oral Tube size: 7.5 mm Number of attempts: 1 Airway Equipment and Method: Stylet Placement Confirmation: ETT inserted through vocal cords under direct vision,  breath sounds checked- equal and bilateral,  positive ETCO2 and CO2 detector Secured at: 21 cm Tube secured with: Tape Dental Injury: Teeth and Oropharynx as per pre-operative assessment

## 2015-09-19 NOTE — Transfer of Care (Signed)
Immediate Anesthesia Transfer of Care Note  Patient: [name] [name]  Procedure(s) Performed: Procedure(s) with comments: THORACIC LAMINECTOMY DECOMPRESSION T2-3, T3-4,T5-6 (N/A) - THORACIC LAMINECTOMY DECOMPRESSION T2-3, T3-4,T5-6  Patient Location: PACU  Anesthesia Type:General  Level of Consciousness: awake, alert , oriented and sedated  Airway & Oxygen Therapy: Patient Spontanous Breathing and Patient connected to nasal cannula oxygen  Post-op Assessment: Report given to RN, Post -op Vital signs reviewed and stable and Patient moving all extremities X 4  Post vital signs: Reviewed and stable  Last Vitals:  Filed Vitals:   09/18/15 2132 09/19/15 0700  BP: 107/53 113/70  Pulse: 83 78  Temp: 36.6 C 36.4 C  Resp: 18 18    Complications: No apparent anesthesia complications

## 2015-09-19 NOTE — Anesthesia Preprocedure Evaluation (Addendum)
Anesthesia Evaluation  Patient identified by MRN, date of birth, ID band Patient awake    Reviewed: Allergy & Precautions, NPO status , Patient's Chart, lab work & pertinent test results  Airway Mallampati: II  TM Distance: >3 FB Neck ROM: Full    Dental no notable dental hx. (+) Edentulous Upper, Dental Advisory Given   Pulmonary former smoker,  CXR: NAD   Pulmonary exam normal breath sounds clear to auscultation       Cardiovascular hypertension, Pt. on medications and Pt. on home beta blockers Normal cardiovascular exam+ dysrhythmias Atrial Fibrillation  Rhythm:Regular Rate:Normal  ECG: Afib   Neuro/Psych negative neurological ROS  negative psych ROS   GI/Hepatic negative GI ROS, Neg liver ROS,   Endo/Other  diabetes, Type 2  Renal/GU negative Renal ROS  negative genitourinary   Musculoskeletal  (+) Arthritis ,   Abdominal (+) + obese,   Peds negative pediatric ROS (+)  Hematology negative hematology ROS (+)   Anesthesia Other Findings   Reproductive/Obstetrics negative OB ROS                           Anesthesia Physical Anesthesia Plan  ASA: III  Anesthesia Plan: General   Post-op Pain Management:    Induction: Intravenous  Airway Management Planned: Oral ETT  Additional Equipment:   Intra-op Plan:   Post-operative Plan: Extubation in OR  Informed Consent: I have reviewed the patients History and Physical, chart, labs and discussed the procedure including the risks, benefits and alternatives for the proposed anesthesia with the patient or authorized representative who has indicated his/her understanding and acceptance.   Dental advisory given  Plan Discussed with: CRNA  Anesthesia Plan Comments:         Anesthesia Quick Evaluation

## 2015-09-19 NOTE — Patient Outreach (Signed)
[facility]) Care Management  09/19/2015  [name] [name] 1934/03/12 [id number]   Assessment: Care Coordination follow-up call-- cancelled due to patient's hospitalization Care coordination follow-up call scheduled today was cancelled due to patient's admission to the hospital (09/15/15). Patient is scheduled for surgery (THORACIC LAMINECTOMY DECOMPRESSION T2-3, T3-4,T5-6) today.   Plan: Will follow-up with patient after hospital discharge for transition of care. Care coordination with hospital liaison of patient's update/ status.  [name] [name] A. [name] [name], BSN, RN-BC [place] Management Coordinator Cell: [phone]

## 2015-09-20 LAB — BASIC METABOLIC PANEL
Anion gap: 8 (ref 5–15)
BUN: 23 mg/dL — AB (ref 4–21)
BUN: 23 mg/dL — AB (ref 6–20)
CALCIUM: 8 mg/dL — AB (ref 8.9–10.3)
CO2: 24 mmol/L (ref 22–32)
CREATININE: 0.84 mg/dL (ref 0.44–1.00)
Chloride: 106 mmol/L (ref 101–111)
Creatinine: 0.8 mg/dL (ref 0.5–1.1)
GFR calc non Af Amer: >60 mL/min (ref >60)
GLUCOSE: 131 mg/dL
Glucose, Bld: 131 mg/dL — ABNORMAL HIGH (ref 65–99)
Potassium: 5 mmol/L (ref 3.5–5.1)
Sodium: 138 mmol/L (ref 135–145)
Sodium: 138 mmol/L (ref 137–147)

## 2015-09-20 LAB — CBC
HEMATOCRIT: 28.3 % — AB (ref 36.0–46.0)
Hemoglobin: 9.2 g/dL — ABNORMAL LOW (ref 12.0–15.0)
MCH: 30.9 pg (ref 26.0–34.0)
MCHC: 32.5 g/dL (ref 30.0–36.0)
MCV: 95 fL (ref 78.0–100.0)
Platelets: 278 10*3/uL (ref 150–400)
RBC: 2.98 MIL/uL — ABNORMAL LOW (ref 3.87–5.11)
RDW: 15.2 % (ref 11.5–15.5)
WBC: 8.8 10*3/uL (ref 4.0–10.5)

## 2015-09-20 LAB — GLUCOSE, CAPILLARY
Glucose-Capillary: 127 mg/dL — ABNORMAL HIGH (ref 65–99)
Glucose-Capillary: 132 mg/dL — ABNORMAL HIGH (ref 65–99)
Glucose-Capillary: 142 mg/dL — ABNORMAL HIGH (ref 65–99)
Glucose-Capillary: 126 mg/dL — ABNORMAL HIGH (ref 65–99)

## 2015-09-20 LAB — CBC AND DIFFERENTIAL: WBC: 8.8 10*3/mL

## 2015-09-20 LAB — TROPONIN I: TROPONIN I: 0.04 ng/mL — AB (ref <0.031)

## 2015-09-20 LAB — PROTIME-INR
INR: 1.37 (ref 0.00–1.49)
PROTHROMBIN TIME: 17 s — AB (ref 11.6–15.2)

## 2015-09-20 NOTE — Evaluation (Signed)
Physical Therapy Evaluation Patient Details Name: [name] [name] MRN: [id number] DOB: 06-02-1934 Today's Date: 09/20/2015   History of Present Illness  82 y.o. female transferred from [facility] with right leg pain and weakness with neuro deficits on exam in the ED.  MRI of her thoracic spine showed multilevel spondylosis with spinal cord compression.  Pt is now s/p T2-3, 3-4, and 5-6 laminectomies for decompression.  Pt with significant PMHx of A-fib, HTN, DM, neck surgery, back surgery, bil TKA, and bil THA.    Clinical Impression  Pt was able to sit EOB today and attempt standing, but unable to stand with only one person assist.  Trunk control is better with periods of supervision without upper extremity support.  She has a long road to recovery ahead of her and will likely need SNF level rehab at discharge.   PT to follow acutely for deficits listed below.       Follow Up Recommendations SNF    Equipment Recommendations  None recommended by PT    Recommendations for Other Services   NA    Precautions / Restrictions Precautions Precautions: Fall;Back Precaution Comments: verbally reviewed back precations, log roll, lifting restrictions      Mobility  Bed Mobility Overal bed mobility: Needs Assistance Bed Mobility: Rolling;Sidelying to Sit;Sit to Sidelying Rolling: Mod assist Sidelying to sit: Mod assist;HOB elevated     Sit to sidelying: Mod assist General bed mobility comments: Pt needed assist at trunk to get all the way to side lying, progress bil legs over EOB, and use bed pad to scoot further out.  Assist needed to help pt pull up to sitting from side lying.  she has an arthritic shoulder on her right and has a hard time pushing up on that elbow.  Assist needed to get both legs up into the bed from sitting.   Transfers Overall transfer level: Needs assistance Equipment used: Rolling walker (2 wheeled) Transfers: Sit to/from Stand           General transfer comment:  Attempted to stand x 2 from EOB with RW. Pt unable to clear bottom from bed with only one person assist.           Balance Overall balance assessment: Needs assistance Sitting-balance support: Feet supported;No upper extremity supported;Bilateral upper extremity supported Sitting balance-Leahy Scale: Fair Sitting balance - Comments: Pt was able for a brief period of time, hold her sitting balance without upper extremity support.                                      Pertinent Vitals/Pain Pain Assessment: Faces Faces Pain Scale: Hurts even more Pain Location: upper back, incisional Pain Descriptors / Indicators: Burning;Aching Pain Intervention(s): Limited activity within patient's tolerance;Monitored during session;Repositioned    Home Living Family/patient expects to be discharged to:: Skilled nursing facility [facility])                      Prior Function Level of Independence: Independent with assistive device(s)         Comments: used RW for gait PTA due to left knee would buckle and give away at times due to left knee cap issues        Extremity/Trunk Assessment   Upper Extremity Assessment: Defer to OT evaluation           Lower Extremity Assessment: Generalized weakness;RLE deficits/detail;LLE deficits/detail  RLE Deficits / Details: Pt with improved bil leg strength compared to just before surgery, at least 3-/5 dorsi flexion bil, able to minimally preform LAQ seated EOB 2+ to 3-/5 bil, but unable to lift legs in bed against gravity.  LLE Deficits / Details: Pt with improved bil leg strength compared to just before surgery, at least 3-/5 dorsi flexion bil, able to minimally preform LAQ seated EOB 2+ to 3-/5 bil, but unable to lift legs in bed against gravity.   Cervical / Trunk Assessment: Kyphotic;Other exceptions  Communication   Communication: No difficulties  Cognition Arousal/Alertness: Awake/alert Behavior During Therapy: WFL  for tasks assessed/performed Overall Cognitive Status: Within Functional Limits for tasks assessed                               Assessment/Plan    PT Assessment Patient needs continued PT services  PT Diagnosis Difficulty walking;Generalized weakness;Abnormality of gait;Acute pain   PT Problem List Decreased strength;Decreased activity tolerance;Decreased balance;Decreased mobility;Decreased knowledge of use of DME;Pain;Obesity  PT Treatment Interventions DME instruction;Gait training;Stair training;Functional mobility training;Therapeutic exercise;Therapeutic activities;Balance training;Neuromuscular re-education;Patient/family education;Modalities   PT Goals (Current goals can be found in the Care Plan section) Acute Rehab PT Goals Patient Stated Goal: to get stronger and back to being more independent, walking with a walker PT Goal Formulation: With patient Time For Goal Achievement: 09/27/15 Potential to Achieve Goals: Good    Frequency Min 5X/week           End of Session   Activity Tolerance: Patient limited by pain Patient left: in bed;with call bell/phone within reach;with family/visitor present Nurse Communication: Mobility status (to RN tech)         Time: [id number] PT Time Calculation (min) (ACUTE ONLY): 23 min   Charges:   PT Evaluation $PT Eval Moderate Complexity: 1 Procedure PT Treatments $Therapeutic Activity: 8-22 mins        Breckin Savannah B. [place], [place], DPT [phone]   09/20/2015, 2:22 PM

## 2015-09-20 NOTE — Progress Notes (Signed)
No acute events AVSS Awake and alert Stable lower extremity strength from pre-op Incision c/d/i Expect that if there's any improvement it will be slow Inpatient rehab vs. SNF I ordered for the drain to be removed at [facility] to start lovenox bridge to coumadin tomorrow

## 2015-09-20 NOTE — Progress Notes (Addendum)
ANTICOAGULATION CONSULT NOTE - Initial Consult  Pharmacy Consult for Coumadin-resume Indication:  H/o Afib  Allergies  Allergen Reactions  . Nsaids     On coumadin  . Statins     Cause leg cramps    Patient Measurements: Height: 5\' 7"  (170.2 cm) Weight: 239 lb (108.41 kg) IBW/kg (Calculated) : 61.6   Vital Signs: Temp: 98.1 F (36.7 C) (03/25 0652) Temp Source: Oral (03/25 0652) BP: 108/56 mmHg (03/25 0900) Pulse Rate: 106 (03/25 0900)  Labs:  Recent Labs  09/18/15 0526 09/19/15 0523 09/20/15 0546  HGB  --  10.0* 9.2*  HCT  --  31.9* 28.3*  PLT  --  296 278  LABPROT 20.3* 16.4* 17.0*  INR 1.74* 1.31 1.37  CREATININE  --  1.05* 0.84    Estimated Creatinine Clearance: 68.8 mL/min (by C-G formula based on Cr of 0.84).   Medical History: Past Medical History  Diagnosis Date  . Atrial fibrillation ([place])   . Hypertension   . Hyperlipidemia   . Osteoarthritis   . Diabetes ([place])     prediabetes    Medications:  Prescriptions prior to admission  Medication Sig Dispense Refill Last Dose  . bisoprolol-hydrochlorothiazide (ZIAC) 10-6.25 MG per tablet Take 1 tablet by mouth 2 (two) times daily.    09/15/2015 at 0800  . calcium carbonate (TUMS EX) 750 MG chewable tablet Chew 2 tablets by mouth daily as needed for heartburn.   Past Month at Unknown time  . Cholecalciferol (VITAMIN D) 2000 UNITS tablet Take 2,000 Units by mouth daily.    Past Week at Unknown time  . cyclobenzaprine (FLEXERIL) 5 MG tablet Take 1 tablet (5 mg total) by mouth 2 (two) times daily as needed for muscle spasms. (Patient taking differently: Take 10 mg by mouth at bedtime. ) 15 tablet 0 09/14/2015 at Unknown time  . hydrocortisone cream 1 % Apply 1 application topically daily as needed for itching.   2 weeks  . loperamide (IMODIUM A-D) 2 MG tablet Take 4 mg by mouth 4 (four) times daily as needed for diarrhea or loose stools.   Past Week at Unknown time  . losartan (COZAAR) 100 MG tablet Take 100  mg by mouth daily.   09/15/2015 at Unknown time  . Multiple Vitamin (MULTIVITAMIN WITH MINERALS) TABS tablet Take 1 tablet by mouth daily.   Past Week at Unknown time  . oxybutynin (DITROPAN) 5 MG tablet Take 5 mg by mouth 2 (two) times daily.    09/15/2015 at Unknown time  . ranitidine (ZANTAC) 75 MG tablet Take 75 mg by mouth daily as needed for heartburn.   09/14/2015 at Unknown time  . rosuvastatin (CRESTOR) 10 MG tablet Take 10 mg by mouth every Monday, Wednesday, and Friday. m w f at bedtime   09/12/2015  . warfarin (COUMADIN) 2 MG tablet Take 2 mg by mouth See admin instructions. Take 2mg s daily on Sun,Mon,Wed,Thurs,Sat and take 3mg s on Tues and Fri   09/14/2015 at 2200  . Wheat Dextrin (BENEFIBER) POWD Mix 1 heaping teaspoon in water or juice daily 236 g 0 09/14/2015 at Unknown time  . HYDROcodone-acetaminophen (NORCO) 5-325 MG tablet Take 1 tablet by mouth every 4 (four) hours as needed. 10 tablet 0 hasnt started   Scheduled:  . bisoprolol-hydrochlorothiazide  1 tablet Oral BID  . cefTRIAXone (ROCEPHIN)  IV  1 g Intravenous Q24H  . cholecalciferol  2,000 Units Oral Daily  . cyclobenzaprine  10 mg Oral QHS  . docusate sodium  100 mg Oral BID  . insulin aspart  0-5 Units Subcutaneous QHS  . insulin aspart  0-9 Units Subcutaneous TID WC  . losartan  100 mg Oral Daily  . oxybutynin  5 mg Oral BID  . pantoprazole (PROTONIX) IV  40 mg Intravenous QHS  . rosuvastatin  10 mg Oral Q M,W,F-2000  . senna  1 tablet Oral BID  . sodium chloride flush  3 mL Intravenous Q12H  . sodium chloride flush  3 mL Intravenous Q12H    Assessment: 82 y.o female who was on chronic coumadin PTA for h/o Afib.  Admitted to [facility] on 3/20, coumadin continued at [facility] until 3/22 then Vitamin K 10mg  IV given 3/22 PM to reverse coumadin. Also received 2nd dose of Vitamin K 10mg  IV on 09/18/15.  Yesterday 3/24 pt went to surgery for thoracic decompression.  Today POD#1 -Dr. Darrick Meigs order coumadin restart per pharmacy protocol.  However,  neurosurgeon , Dr. Cyndy Freeze noted that okay to start lovenox bridge to coumadin tomorrow.  Pharmacist, Guy Begin discussed anticoagulation plans & neurosurgeon's recommendation with Dr. Darrick Meigs.  Dr. Darrick Meigs would like to restart Coumadin tomorrow 3/26 without LMWH bridge. Currently patient has SCDs on.   Goal of Therapy:  INR 2-3 Monitor platelets by anticoagulation protocol: Yes   Plan:  Pharmacist will resume coumadin tomorrow. Daily PT/INR   Nicole Cella, RPh Clinical Pharmacist Pager: [phone] 09/20/2015,11:23 AM

## 2015-09-20 NOTE — Progress Notes (Signed)
PT Cancellation Note  Patient Details Name: [name] [name] MRN: [id number] DOB: 21-Dec-1933   Cancelled Treatment:    Reason Eval/Treat Not Completed: Medical issues which prohibited therapy.  Per RN pt reporting chest pain, so they are going to run triponin and do and EKG.  PT to check back in PM as time allows.    Thanks,  Barbarann Ehlers. [place], [place], DPT [phone]   09/20/2015, 9:44 AM

## 2015-09-20 NOTE — Progress Notes (Addendum)
[facility] PROGRESS NOTE  [name] [name] [id number] DOB: 1933/09/20 DOA: 09/15/2015 PCP: Gennette Pac, MD  Assessment/Plan:  Spondylosis with myelopathy- patient presented with spinal cord compression as per MRI, has been seen by neurosurgery. Patient underwent  T2-3, 3-4, and 5-6 laminectomies for decompression.  History of atrial fibrillation- coumadin is on hold. Patient has a history of bleeding with Xarelto, I do not feel that she will need bridging with Lovenox. Called and discussed with cardiology, and they agree with not to bridge with Lovenox due to high risk of bleeding. We'll start Coumadin per pharmacy from tomorrow as per neurosurgery recommendation.  Hyperlipidemia- continue Crestor  Hypertension- blood pressure is controlled, continue Cozaar.  Diabetes mellitus- blood glucose well controlled, continue sliding scale insulin with NovoLog.  UTI- patient has abnormal UA, urine culture showed no growth to date. Start ceftriaxone 1 g IV every 24 hours.   Code Status: Full code Family Communication: *No family present at bedside [facility]position Plan: [facility]   Consultants:  Neurosurgery  Procedures:  None  Antibiotics:  None  HPI/Subjective: Patient presented with the chief complaint of right leg pain and weakness. She was seen in the office two weeks ago with bilateral leg weakness. She reported that she had been fatigued because she had the flu. She did not have any neurological deficits when seen at the office 2 weeks ago. She was started on home PT but was unable to participate in more than one visit because of acute right leg pain. She reports that she went to the ED where they took x-rays of her pelvis and knees, showing no bony abnormalities or complications with her THAs and TKAs. Since being sent back home she has been unable to care for herself in anyway because of the pain and weakness. She presents with severe right leg pain and  weakness with acute neuro deficits on exam. She has a history of a lumbar fusion in the 1980s by Dr. Agapito Games at [facility] spine clinic.   Patient complains of back pain. Patient is s/p surgery.  Objective: Filed Vitals:   09/20/15 0900 09/20/15 1300  BP: 108/56 105/62  Pulse: 106 60  Temp:  98.4 F (36.9 C)  Resp:  16    Intake/Output Summary (Last 24 hours) at 09/20/15 1509 Last data filed at 09/20/15 1300  Gross per 24 hour  Intake    480 ml  Output    975 ml  Net   -495 ml   Filed Weights   09/16/15 2136  Weight: 108.41 kg (239 lb)    Exam:   General:  Appears in no acute distress  Cardiovascular: S1-S2 regular  Respiratory: Clear to auscultation bilaterally  Abdomen: Soft, nontender, no organomegaly  Musculoskeletal: No cyanosis/clubbing/edema of the lower extremities   Data Reviewed: Basic Metabolic Panel:  Recent Labs Lab 09/15/15 1638 09/16/15 0433 09/19/15 0523 09/20/15 0546  NA 143 140 140 138  K 3.7 4.8 4.2 5.0  CL 104 102 106 106  CO2 28 28 27 24   GLUCOSE 132* 153* 138* 131*  BUN 50* 49* 31* 23*  CREATININE 1.80* 1.61* 1.05* 0.84  CALCIUM 8.8* 8.6* 8.3* 8.0*   Liver Function Tests:  Recent Labs Lab 09/15/15 1638 09/16/15 0433  AST 27 23  ALT 19 17  ALKPHOS 54 52  BILITOT 2.4* 2.5*  PROT 6.4* 6.2*  ALBUMIN 3.2* 3.0*   No results for input(s): LIPASE, AMYLASE in the last 168 hours. No results for input(s): AMMONIA in the  last 168 hours. CBC:  Recent Labs Lab 09/15/15 1638 09/16/15 0433 09/19/15 0523 09/20/15 0546  WBC 10.3 9.8 8.9 8.8  NEUTROABS 7.6  --   --   --   HGB 10.3* 10.0* 10.0* 9.2*  HCT 31.2* 31.8* 31.9* 28.3*  MCV 92.6 97.2 94.9 95.0  PLT 303 309 296 278    CBG:  Recent Labs Lab 09/19/15 1259 09/19/15 1623 09/19/15 2230 09/20/15 0657 09/20/15 1155  GLUCAP 116* 103* 134* 127* 132*    Recent Results (from the past 240 hour(s))  Surgical PCR screen     Status: None   Collection Time: 09/18/15   6:23 AM  Result Value Ref Range Status   MRSA, PCR NEGATIVE NEGATIVE Final   Staphylococcus aureus NEGATIVE NEGATIVE Final    Comment:        The Xpert SA Assay (FDA approved for NASAL specimens in patients over 91 years of age), is one component of a comprehensive surveillance program.  Test performance has been validated by [facility] for patients greater than or equal to 33 year old. It is not intended to diagnose infection nor to guide or monitor treatment.   Urine culture     Status: None (Preliminary result)   Collection Time: 09/19/15  5:32 PM  Result Value Ref Range Status   Specimen Description URINE, CATHETERIZED  Final   Special Requests NONE  Final   Culture NO GROWTH < 24 HOURS  Final   Report Status PENDING  Incomplete     Studies: Dg Cervical Spine 2-3 Views  09/19/2015  CLINICAL DATA:  T2-T3 thoracic laminectomy. EXAM: CERVICAL SPINE - 2-3 VIEW COMPARISON:  None. FINDINGS: Two intraoperative images were submitted. On image number 1, there is a surgical marker along the posterior aspect of the cervicothoracic junction. On the second image, there are surgical instruments in the region of T2-T3 IMPRESSION: Surgical marking in the region of T2-T3. Electronically Signed   By: Markus Daft M.D.   On: 09/19/2015 09:57    Scheduled Meds: . bisoprolol-hydrochlorothiazide  1 tablet Oral BID  . cefTRIAXone (ROCEPHIN)  IV  1 g Intravenous Q24H  . cholecalciferol  2,000 Units Oral Daily  . cyclobenzaprine  10 mg Oral QHS  . docusate sodium  100 mg Oral BID  . insulin aspart  0-5 Units Subcutaneous QHS  . insulin aspart  0-9 Units Subcutaneous TID WC  . losartan  100 mg Oral Daily  . oxybutynin  5 mg Oral BID  . pantoprazole (PROTONIX) IV  40 mg Intravenous QHS  . rosuvastatin  10 mg Oral Q M,W,F-2000  . senna  1 tablet Oral BID  . sodium chloride flush  3 mL Intravenous Q12H  . sodium chloride flush  3 mL Intravenous Q12H   Continuous Infusions: . sodium chloride 75  mL/hr at 09/19/15 2310  . sodium chloride    . sodium chloride      Active Problems:   Atrial fibrillation ([facility])   History of lumbar fusion   Right foot drop   Cord compression ([place])    Time spent: 25 min    [facility] Pager [phone]. If 7PM-7AM, please contact night-coverage at [email or web address], password [facility] 09/20/2015, 3:09 PM  LOS: 5 days

## 2015-09-20 NOTE — Progress Notes (Signed)
OT Cancellation Note  Patient Details Name: [name] [name] MRN: [id number] DOB: 1934-04-16   Cancelled Treatment:    Reason Eval/Treat Not Completed: Medical issues which prohibited therapy. Will follow up for OT eval as time allows.  Binnie Kand M.S., OTR/L Pager: [phone]  09/20/2015, 10:06 AM

## 2015-09-21 LAB — TYPE AND SCREEN
ABO/RH(D): O POS
ANTIBODY SCREEN: NEGATIVE
Donor AG Type: NEGATIVE
Donor AG Type: NEGATIVE
UNIT DIVISION: 0
UNIT DIVISION: 0

## 2015-09-21 LAB — GLUCOSE, CAPILLARY
GLUCOSE-CAPILLARY: 135 mg/dL — AB (ref 65–99)
Glucose-Capillary: 132 mg/dL — ABNORMAL HIGH (ref 65–99)
Glucose-Capillary: 123 mg/dL — ABNORMAL HIGH (ref 65–99)
Glucose-Capillary: 112 mg/dL — ABNORMAL HIGH (ref 65–99)
Glucose-Capillary: 150 mg/dL — ABNORMAL HIGH (ref 65–99)

## 2015-09-21 LAB — URINE CULTURE: CULTURE: NO GROWTH

## 2015-09-21 LAB — PROTIME-INR
INR: 1.34 (ref 0.00–1.49)
PROTHROMBIN TIME: 16.7 s — AB (ref 11.6–15.2)

## 2015-09-21 MED ORDER — PANTOPRAZOLE SODIUM 40 MG PO TBEC
40.0000 mg | DELAYED_RELEASE_TABLET | Freq: Every day | ORAL | Status: DC
  Administered 2015-09-21 – 2015-09-23 (×3): 40 mg via ORAL
  Filled 2015-09-21 (×4): qty 1

## 2015-09-21 MED ORDER — WARFARIN - PHARMACIST DOSING INPATIENT: Freq: Every day | Status: DC

## 2015-09-21 MED ORDER — WARFARIN SODIUM 4 MG PO TABS
4.0000 mg | ORAL_TABLET | Freq: Once | ORAL | Status: AC
  Administered 2015-09-21: 4 mg via ORAL
  Filled 2015-09-21: qty 1

## 2015-09-21 NOTE — Evaluation (Signed)
Occupational Therapy Evaluation Patient Details Name: [name] [name] MRN: [id number] DOB: 06/14/1934 Today's Date: 09/21/2015    History of Present Illness 82 y.o. female transferred from [facility] with right leg pain and weakness with neuro deficits on exam in the ED.  MRI of her thoracic spine showed multilevel spondylosis with spinal cord compression.  Pt is now s/p T2-3, 3-4, and 5-6 laminectomies for decompression.  Pt with significant PMHx of A-fib, HTN, DM, neck surgery, back surgery, bil TKA, and bil THA.     Clinical Impression   Pt presenting with significant bil LE weakness limiting her independence and safety with ADLs and functional mobility currently. Pt requires max assist +2 for squat pivot transfers; unable to come into full standing position with leg weakness. Pt currently min assist for ADLs in sitting and max assist for LB ADLs. Recommend SNF for follow up in order to maximize independence and safety with ADLs and functional mobility prior to return home. Pt would benefit from continued skilled OT to address established goals.    Follow Up Recommendations  SNF;Supervision/Assistance - 24 hour    Equipment Recommendations  Other (comment) (TBD at next venue)    Recommendations for Other Services       Precautions / Restrictions Precautions Precautions: Fall;Back Precaution Comments: Pt able to verbally recall 2/3 back precautions. Reviewed all precautions with pt. Restrictions Weight Bearing Restrictions: No      Mobility Bed Mobility Overal bed mobility: Needs Assistance Bed Mobility: Rolling;Sidelying to Sit Rolling: Mod assist Sidelying to sit: Mod assist;+2 for physical assistance (for LEs and trunk)       General bed mobility comments: Pt with difficulty coming from sidelying to sitting-heavy mod assist required. Use of bed pad to shift hips to EOB.  Transfers Overall transfer level: Needs assistance Equipment used: None Transfers: Squat Pivot  Transfers     Squat pivot transfers: Max assist;+2 physical assistance     General transfer comment: Attempted sit to stand x 2 with use of RW-both attempts unsuccessful.     Balance Overall balance assessment: Needs assistance Sitting-balance support: Bilateral upper extremity supported;Feet supported Sitting balance-Leahy Scale: Poor     Standing balance support: Bilateral upper extremity supported Standing balance-Leahy Scale: Zero                              ADL Overall ADL's : Needs assistance/impaired Eating/Feeding: Set up;Sitting   Grooming: Set up;Sitting   Upper Body Bathing: Minimal assitance;Sitting   Lower Body Bathing: Maximal assistance;Sitting/lateral leans   Upper Body Dressing : Minimal assistance;Sitting   Lower Body Dressing: Maximal assistance;Sitting/lateral leans   Toilet Transfer: Maximal assistance;+2 for physical assistance;Squat-pivot;BSC Toilet Transfer Details (indicate cue type and reason): Simulated by transfer from EOB to chair Toileting- Clothing Manipulation and Hygiene: Total assistance       Functional mobility during ADLs: Maximal assistance;+2 for physical assistance (for squat pivot transfer) General ADL Comments: Pt with significant bil LE weakness limiting her ablility to assist with standing and transfers. Educated on bed mobility technique and maintaining back precautions during functional activities.     Vision Vision Assessment?: No apparent visual deficits   Perception     Praxis      Pertinent Vitals/Pain Pain Assessment: Faces Faces Pain Scale: Hurts even more Pain Location: back Pain Descriptors / Indicators: Aching Pain Intervention(s): Limited activity within patient's tolerance;Monitored during session;Repositioned     Hand Dominance     Extremity/Trunk  Assessment Upper Extremity Assessment Upper Extremity Assessment: Overall WFL for tasks assessed   Lower Extremity Assessment Lower  Extremity Assessment: Defer to PT evaluation   Cervical / Trunk Assessment Cervical / Trunk Assessment: Kyphotic;Other exceptions Cervical / Trunk Exceptions: Pt with history of both cervical and lumbar surgeries. Trunk control improved compared to prior to surgery.    Communication Communication Communication: No difficulties   Cognition Arousal/Alertness: Awake/alert Behavior During Therapy: WFL for tasks assessed/performed Overall Cognitive Status: Within Functional Limits for tasks assessed       Memory: Decreased recall of precautions             General Comments       Exercises       Shoulder Instructions      Home Living Family/patient expects to be discharged to:: Skilled nursing facility [facility])                                        Prior Functioning/Environment Level of Independence: Independent with assistive device(s)        Comments: used RW for gait PTA due to left knee would buckle and give away at times due to left knee cap issues    OT Diagnosis: Generalized weakness;Acute pain   OT Problem List: Decreased strength;Decreased activity tolerance;Impaired balance (sitting and/or standing);Decreased safety awareness;Decreased knowledge of use of DME or AE;Decreased knowledge of precautions;Obesity;Pain   OT Treatment/Interventions: Self-care/ADL training;Energy conservation;DME and/or AE instruction;Therapeutic activities;Patient/family education;Balance training    OT Goals(Current goals can be found in the care plan section) Acute Rehab OT Goals Patient Stated Goal: to get stronger and back to being more independent, walking with a walker OT Goal Formulation: With patient Time For Goal Achievement: 10/05/15 Potential to Achieve Goals: Fair ADL Goals Pt Will Perform Grooming: with set-up;sitting Pt Will Perform Lower Body Bathing: with min assist;sit to/from stand;with adaptive equipment Pt Will Perform Lower Body  Dressing: with min assist;with adaptive equipment;sit to/from stand Pt Will Transfer to Toilet: with min assist;stand pivot transfer;bedside commode Pt Will Perform Toileting - Clothing Manipulation and hygiene: with min assist;with adaptive equipment;sit to/from stand  OT Frequency: Min 2X/week   Barriers to D/C:            Co-evaluation              End of Session Equipment Utilized During Treatment: Gait belt;Oxygen (2L O2 reapplied at end of session) Nurse Communication: Mobility status;Need for lift equipment  Activity Tolerance: Patient limited by pain (bil LE weakness) Patient left: in chair;with call bell/phone within reach   Time: [id number] OT Time Calculation (min): 20 min Charges:  OT General Charges $OT Visit: 1 Procedure OT Evaluation $OT Eval Moderate Complexity: 1 Procedure G-Codes:     Binnie Kand M.S., OTR/L Pager: [phone]  09/21/2015, 12:11 PM

## 2015-09-21 NOTE — Progress Notes (Signed)
[facility] PROGRESS NOTE  [name] [name] [id number] DOB: 03-08-1934 DOA: 09/15/2015 PCP: Gennette Pac, MD  Assessment/Plan:  Spondylosis with myelopathy- patient presented with spinal cord compression as per MRI, has been seen by neurosurgery. Patient underwent  T2-3, 3-4, and 5-6 laminectomies for decompression.  History of atrial fibrillation- coumadin is to be restarted today as per neurosurgery recommendation. Patient has a history of bleeding with Xarelto, I do not feel that she will need bridging with Lovenox.   Hyperlipidemia- continue Crestor  Hypertension- blood pressure is controlled, continue Cozaar.  Diabetes mellitus- blood glucose well controlled, continue sliding scale insulin with NovoLog.  UTI- patient has abnormal UA, urine culture showed no growth. Patient was empirically started on ceftriaxone. Will discontinue ceftriaxone at this time.   Code Status: Full code Family Communication: *No family present at bedside Disposition Plan: [facility] nursing facility.   Consultants:  Neurosurgery  Procedures:  None  Antibiotics:  None  HPI/Subjective: Patient presented with the chief complaint of right leg pain and weakness. She was seen in the office two weeks ago with bilateral leg weakness. She reported that she had been fatigued because she had the flu. She did not have any neurological deficits when seen at the office 2 weeks ago. She was started on home PT but was unable to participate in more than one visit because of acute right leg pain. She reports that she went to the ED where they took x-rays of her pelvis and knees, showing no bony abnormalities or complications with her THAs and TKAs. Since being sent back home she has been unable to care for herself in anyway because of the pain and weakness. She presents with severe right leg pain and weakness with acute neuro deficits on exam. She has a history of a lumbar fusion in the 1980s by Dr. Agapito Games at [facility] spine clinic.   Patient denies any complaints this morning.  Objective: Filed Vitals:   09/21/15 0708 09/21/15 1246  BP: 110/59 103/42  Pulse: 77 72  Temp: 97.8 F (36.6 C) 98.9 F (37.2 C)  Resp: 16 18    Intake/Output Summary (Last 24 hours) at 09/21/15 1436 Last data filed at 09/21/15 1247  Gross per 24 hour  Intake    240 ml  Output   1000 ml  Net   -760 ml   Filed Weights   09/16/15 2136  Weight: 108.41 kg (239 lb)    Exam:   General:  Appears in no acute distress  Cardiovascular: S1-S2 regular  Respiratory: Clear to auscultation bilaterally  Abdomen: Soft, nontender, no organomegaly  Musculoskeletal: No cyanosis/clubbing/edema of the lower extremities   Data Reviewed: Basic Metabolic Panel:  Recent Labs Lab 09/15/15 1638 09/16/15 0433 09/19/15 0523 09/20/15 0546  NA 143 140 140 138  K 3.7 4.8 4.2 5.0  CL 104 102 106 106  CO2 28 28 27 24   GLUCOSE 132* 153* 138* 131*  BUN 50* 49* 31* 23*  CREATININE 1.80* 1.61* 1.05* 0.84  CALCIUM 8.8* 8.6* 8.3* 8.0*   Liver Function Tests:  Recent Labs Lab 09/15/15 1638 09/16/15 0433  AST 27 23  ALT 19 17  ALKPHOS 54 52  BILITOT 2.4* 2.5*  PROT 6.4* 6.2*  ALBUMIN 3.2* 3.0*   No results for input(s): LIPASE, AMYLASE in the last 168 hours. No results for input(s): AMMONIA in the last 168 hours. CBC:  Recent Labs Lab 09/15/15 1638 09/16/15 0433 09/19/15 0523 09/20/15 0546  WBC 10.3 9.8 8.9 8.8  NEUTROABS 7.6  --   --   --   HGB 10.3* 10.0* 10.0* 9.2*  HCT 31.2* 31.8* 31.9* 28.3*  MCV 92.6 97.2 94.9 95.0  PLT 303 309 296 278    CBG:  Recent Labs Lab 09/20/15 1155 09/20/15 1659 09/20/15 2126 09/21/15 0702 09/21/15 1110  GLUCAP 132* 142* 126* 123* 135*    Recent Results (from the past 240 hour(s))  Surgical PCR screen     Status: None   Collection Time: 09/18/15  6:23 AM  Result Value Ref Range Status   MRSA, PCR NEGATIVE NEGATIVE Final   Staphylococcus aureus  NEGATIVE NEGATIVE Final    Comment:        The Xpert SA Assay (FDA approved for NASAL specimens in patients over 81 years of age), is one component of a comprehensive surveillance program.  Test performance has been validated by [facility] for patients greater than or equal to 48 year old. It is not intended to diagnose infection nor to guide or monitor treatment.   Urine culture     Status: None   Collection Time: 09/19/15  5:32 PM  Result Value Ref Range Status   Specimen Description URINE, CATHETERIZED  Final   Special Requests NONE  Final   Culture NO GROWTH 2 DAYS  Final   Report Status 09/21/2015 FINAL  Final     Studies: No results found.  Scheduled Meds: . bisoprolol-hydrochlorothiazide  1 tablet Oral BID  . cefTRIAXone (ROCEPHIN)  IV  1 g Intravenous Q24H  . cholecalciferol  2,000 Units Oral Daily  . cyclobenzaprine  10 mg Oral QHS  . docusate sodium  100 mg Oral BID  . insulin aspart  0-5 Units Subcutaneous QHS  . insulin aspart  0-9 Units Subcutaneous TID WC  . losartan  100 mg Oral Daily  . oxybutynin  5 mg Oral BID  . pantoprazole  40 mg Oral Daily  . rosuvastatin  10 mg Oral Q M,W,F-2000  . senna  1 tablet Oral BID  . sodium chloride flush  3 mL Intravenous Q12H  . sodium chloride flush  3 mL Intravenous Q12H  . warfarin  4 mg Oral ONCE-1800  . Warfarin - Pharmacist Dosing Inpatient   Does not apply q1800   Continuous Infusions: . sodium chloride 75 mL/hr at 09/19/15 2310  . sodium chloride    . sodium chloride      Active Problems:   Atrial fibrillation (HCC)   History of lumbar fusion   Right foot drop   Cord compression ([place])    Time spent: 25 min    [place] [facility] Pager [phone]. If 7PM-7AM, please contact night-coverage at [email or web address], password [facility] 09/21/2015, 2:36 PM  LOS: 6 days

## 2015-09-21 NOTE — Progress Notes (Signed)
ANTICOAGULATION CONSULT NOTE - Initial Consult  Pharmacy Consult for Coumadin-resume Indication:  H/o Afib  Allergies  Allergen Reactions  . Nsaids     On coumadin  . Statins     Cause leg cramps    Patient Measurements: Height: 5\' 7"  (170.2 cm) Weight: 239 lb (108.41 kg) IBW/kg (Calculated) : 61.6   Vital Signs: Temp: 97.8 F (36.6 C) (03/26 0708) Temp Source: Oral (03/26 0708) BP: 110/59 mmHg (03/26 0708) Pulse Rate: 77 (03/26 0708)  Labs:  Recent Labs  09/19/15 0523 09/20/15 0546 09/20/15 0944 09/20/15 1450 09/20/15 2143 09/21/15 0601  HGB 10.0* 9.2*  --   --   --   --   HCT 31.9* 28.3*  --   --   --   --   PLT 296 278  --   --   --   --   LABPROT 16.4* 17.0*  --   --   --  16.7*  INR 1.31 1.37  --   --   --  1.34  CREATININE 1.05* 0.84  --   --   --   --   TROPONINI  --   --  <0.03 <0.03 0.04*  --     Estimated Creatinine Clearance: 68.8 mL/min (by C-G formula based on Cr of 0.84).   Medical History: Past Medical History  Diagnosis Date  . Atrial fibrillation ([place])   . Hypertension   . Hyperlipidemia   . Osteoarthritis   . Diabetes ([place])     prediabetes    Medications:  Prescriptions prior to admission  Medication Sig Dispense Refill Last Dose  . bisoprolol-hydrochlorothiazide (ZIAC) 10-6.25 MG per tablet Take 1 tablet by mouth 2 (two) times daily.    09/15/2015 at 0800  . calcium carbonate (TUMS EX) 750 MG chewable tablet Chew 2 tablets by mouth daily as needed for heartburn.   Past Month at Unknown time  . Cholecalciferol (VITAMIN D) 2000 UNITS tablet Take 2,000 Units by mouth daily.    Past Week at Unknown time  . cyclobenzaprine (FLEXERIL) 5 MG tablet Take 1 tablet (5 mg total) by mouth 2 (two) times daily as needed for muscle spasms. (Patient taking differently: Take 10 mg by mouth at bedtime. ) 15 tablet 0 09/14/2015 at Unknown time  . hydrocortisone cream 1 % Apply 1 application topically daily as needed for itching.   2 weeks  . loperamide  (IMODIUM A-D) 2 MG tablet Take 4 mg by mouth 4 (four) times daily as needed for diarrhea or loose stools.   Past Week at Unknown time  . losartan (COZAAR) 100 MG tablet Take 100 mg by mouth daily.   09/15/2015 at Unknown time  . Multiple Vitamin (MULTIVITAMIN WITH MINERALS) TABS tablet Take 1 tablet by mouth daily.   Past Week at Unknown time  . oxybutynin (DITROPAN) 5 MG tablet Take 5 mg by mouth 2 (two) times daily.    09/15/2015 at Unknown time  . ranitidine (ZANTAC) 75 MG tablet Take 75 mg by mouth daily as needed for heartburn.   09/14/2015 at Unknown time  . rosuvastatin (CRESTOR) 10 MG tablet Take 10 mg by mouth every Monday, Wednesday, and Friday. m w f at bedtime   09/12/2015  . warfarin (COUMADIN) 2 MG tablet Take 2 mg by mouth See admin instructions. Take 2mg s daily on Sun,Mon,Wed,Thurs,Sat and take 3mg s on Tues and Fri   09/14/2015 at 2200  . Wheat Dextrin (BENEFIBER) POWD Mix 1 heaping teaspoon in water or juice  daily 236 g 0 09/14/2015 at Unknown time  . HYDROcodone-acetaminophen (NORCO) 5-325 MG tablet Take 1 tablet by mouth every 4 (four) hours as needed. 10 tablet 0 hasnt started   Scheduled:  . bisoprolol-hydrochlorothiazide  1 tablet Oral BID  . cefTRIAXone (ROCEPHIN)  IV  1 g Intravenous Q24H  . cholecalciferol  2,000 Units Oral Daily  . cyclobenzaprine  10 mg Oral QHS  . docusate sodium  100 mg Oral BID  . insulin aspart  0-5 Units Subcutaneous QHS  . insulin aspart  0-9 Units Subcutaneous TID WC  . losartan  100 mg Oral Daily  . oxybutynin  5 mg Oral BID  . pantoprazole (PROTONIX) IV  40 mg Intravenous QHS  . rosuvastatin  10 mg Oral Q M,W,F-2000  . senna  1 tablet Oral BID  . sodium chloride flush  3 mL Intravenous Q12H  . sodium chloride flush  3 mL Intravenous Q12H    Assessment: 82 y.o female who was on chronic coumadin PTA for h/o Afib.  Admitted to [facility] on 3/20, coumadin continued at [facility] until 3/22 then Vitamin K 10mg  IV given 3/22 PM to reverse coumadin. Also received  2nd dose of Vitamin K 10mg  IV on 09/18/15.  Yesterday 3/24 pt went to surgery for thoracic decompression.  Today POD#2 -Dr. Darrick Meigs order coumadin restart per pharmacy protocol. However,  neurosurgeon , Dr. Cyndy Freeze noted that okay to start lovenox bridge to coumadin tomorrow.  Pharmacist, Guy Begin discussed anticoagulation plans & neurosurgeon's recommendation with Dr. Darrick Meigs.  Dr. Darrick Meigs would like to restart Coumadin tomorrow 3/26 without LMWH bridge. Currently patient has SCDs on.  Resuming coumadin today. Pt has gotten 2 doses of vitamin K 10mg , therefore, INR may be resistant now.   Goal of Therapy:  INR 2-3 Monitor platelets by anticoagulation protocol: Yes   Plan:   Coumadin 4mg  PO x1 Daily PT/INR  Onnie Boer, PharmD Pager: [phone] 09/21/2015 10:04 AM

## 2015-09-21 NOTE — Progress Notes (Signed)
Subjective: 2 Days Post-Op Procedure(s) (LRB): THORACIC LAMINECTOMY DECOMPRESSION T2-3, T3-4,T5-6 (N/A) Patient reports pain as 2 on 0-10 scale. Courtesy Visit. She feels that she can move her legs a little better.   Objective: Vital signs in last 24 hours: Temp:  [97.8 F (36.6 C)-99.6 F (37.6 C)] 97.8 F (36.6 C) (03/26 0708) Pulse Rate:  [60-110] 77 (03/26 0708) Resp:  [16] 16 (03/26 0708) BP: (84-110)/(56-62) 110/59 mmHg (03/26 0708) SpO2:  [92 %-100 %] 100 % (03/26 0708)  Intake/Output from previous day: 03/25 0701 - 03/26 0700 In: 360 [P.O.:360] Out: 450 [Urine:400; Drains:50] Intake/Output this shift: Total I/O In: -  Out: 850 [Urine:850]   Recent Labs  09/19/15 0523 09/20/15 0546  HGB 10.0* 9.2*    Recent Labs  09/19/15 0523 09/20/15 0546  WBC 8.9 8.8  RBC 3.36* 2.98*  HCT 31.9* 28.3*  PLT 296 278    Recent Labs  09/19/15 0523 09/20/15 0546  NA 140 138  K 4.2 5.0  CL 106 106  CO2 27 24  BUN 31* 23*  CREATININE 1.05* 0.84  GLUCOSE 138* 131*  CALCIUM 8.3* 8.0*    Recent Labs  09/20/15 0546 09/21/15 0601  INR 1.37 1.34    She moves her lowers better since surgery.  Assessment/Plan: 2 Days Post-Op Procedure(s) (LRB): THORACIC LAMINECTOMY DECOMPRESSION T2-3, T3-4,T5-6 (N/A) Up with therapy  [name] [name] A 09/21/2015, 7:35 AM

## 2015-09-22 ENCOUNTER — Encounter (HOSPITAL_COMMUNITY): Payer: Self-pay | Admitting: Neurological Surgery

## 2015-09-22 DIAGNOSIS — I482 Chronic atrial fibrillation: Secondary | ICD-10-CM

## 2015-09-22 DIAGNOSIS — N390 Urinary tract infection, site not specified: Secondary | ICD-10-CM

## 2015-09-22 DIAGNOSIS — E785 Hyperlipidemia, unspecified: Secondary | ICD-10-CM

## 2015-09-22 DIAGNOSIS — G9520 Unspecified cord compression: Secondary | ICD-10-CM

## 2015-09-22 LAB — GLUCOSE, CAPILLARY
GLUCOSE-CAPILLARY: 115 mg/dL — AB (ref 65–99)
GLUCOSE-CAPILLARY: 125 mg/dL — AB (ref 65–99)
Glucose-Capillary: 133 mg/dL — ABNORMAL HIGH (ref 65–99)
Glucose-Capillary: 141 mg/dL — ABNORMAL HIGH (ref 65–99)

## 2015-09-22 LAB — TROPONIN I: TROPONIN I: 0.03 ng/mL (ref <0.031)

## 2015-09-22 LAB — PROTIME-INR
INR: 1.22 (ref 0.00–1.49)
PROTHROMBIN TIME: 15.5 s — AB (ref 11.6–15.2)

## 2015-09-22 MED ORDER — WARFARIN SODIUM 4 MG PO TABS
4.0000 mg | ORAL_TABLET | Freq: Once | ORAL | Status: AC
  Administered 2015-09-22: 4 mg via ORAL
  Filled 2015-09-22: qty 1

## 2015-09-22 MED ORDER — ENSURE ENLIVE PO LIQD
237.0000 mL | Freq: Two times a day (BID) | ORAL | Status: DC
  Administered 2015-09-23 (×2): 237 mL via ORAL

## 2015-09-22 NOTE — Progress Notes (Addendum)
Patient ID: [name], female   DOB: 03/05/1934, 82 y.o.   MRN: [id number] [facility] PROGRESS NOTE  [name] [name] [id number] DOB: 1934-05-30 DOA: 09/15/2015 PCP: [name] Pac, MD  Brief narrative:    82 year old female with past medical history of atrial fibrillation, on anticoagulation with Coumadin, hypertension, dyslipidemia who was reporting ongoing bilateral leg weakness for about 2 weeks prior to the admission. She reports she could not participate with the physical therapy at home which was ordered for THAs and TKAs. She presented to [facility] because she could not take care of herself in any possible way in because of ongoing pain and weakness. Patient is status post thoracic laminectomies for decompression, T2-T3, T3-T4, T5-T6 on 09/19/2015 by neurosurgery.  Assessment/Plan:    Principal problem: Symptomatic cord compression at T3-4 and T5-6 secondary to spondylosis with thoracic myelopathy, thoracic stenosis T2-3, T3-4, T5-6 - Two levels of probably symptomatic cord compression at T3-4 and T5-6. Findings are primarily secondary to spondylosis at T3-4 and a central disc extrusion at T5-6 per MRI 09/17/2015  - Per PT - SNF placement   Active Problems: Atrial fibrillation - CHADS vasc score 3 - Continue anticoagulation with coumadin - Rate controlled with Ziac  Essential hypertension - Continue Ziac, losartan  Dyslipidemia  - Continue Crestor   UTI - Urine culture showed no growth.  - Stop rocephin today   Elevated troponin level - Mild trop level 0.04 but 2 other levels WNL - Likely demand ischemia from acute illness, cord compression - Will cycle for 2 more sets of CE   DVT Prophylaxis  - On full dose anticoagulation with Coumadin   Code Status: DNR/DNI Family Communication:  plan of care discussed with the patient; family not at the bedside this morning Disposition Plan: To skilled nursing facility likely by 09/24/2015  IV  access:  Peripheral IV  Procedures and diagnostic studies:    Dg Cervical Spine 2-3 Views 09/19/2015 Surgical marking in the region of T2-T3.  Mr Thoracic Spine Wo Contrast 09/17/2015  Two levels of probably symptomatic cord compression at T3-4 and T5-6. Findings are primarily secondary to spondylosis at T3-4 and a central disc extrusion at T5-6. At both levels, there is mild cord flattening and possible abnormal cord signal which may reflect edema or myelomalacia. 2. Mild degenerative changes at the other levels as described without definite cord compression. 3. No acute osseous findings. 4. Previous cervical and thoracic fusion. 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   Dg Chest Port 1 View 09/17/2015  No active disease.   Medical Consultants:  Neurosurgery  IAnti-Infectives:   None   [name] Lenz, MD  [facility] Pager [phone]  Time spent in minutes: 25 minutes  If 7PM-7AM, please contact night-coverage [email or web address] Password [facility] 09/22/2015, 12:19 PM   LOS: 7 days    HPI/Subjective: No acute overnight events. Patient reports she feels little better this am.   Objective: Filed Vitals:   09/21/15 0708 09/21/15 1246 09/21/15 2112 09/22/15 0615  BP: 110/59 103/42 117/56 118/65  Pulse: 77 72 82 89  Temp: 97.8 F (36.6 C) 98.9 F (37.2 C) 98.6 F (37 C) 98 F (36.7 C)  TempSrc: Oral  Oral Oral  Resp: 16 18 16 16   Height:      Weight:      SpO2: 100% 98% 95% 97%    Intake/Output Summary (Last 24 hours) at 09/22/15 1219 Last data filed at 09/22/15 0858  Gross per 24 hour  Intake  246 ml  Output   1000 ml  Net   -754 ml    Exam:   General:  Pt is alert, follows commands appropriately, not in acute distress  Cardiovascular: Regular rate and rhythm, S1/S2 appreciated   Respiratory: no wheezing, no crackles, no rhonchi  Abdomen: Soft, non tender, non distended, bowel sounds present  Extremities: No edema, pulses palpable bilaterally  Neuro: Grossly  nonfocal  Data Reviewed: Basic Metabolic Panel:  Recent Labs Lab 09/15/15 1638 09/16/15 0433 09/19/15 0523 09/20/15 0546  NA 143 140 140 138  K 3.7 4.8 4.2 5.0  CL 104 102 106 106  CO2 28 28 27 24   GLUCOSE 132* 153* 138* 131*  BUN 50* 49* 31* 23*  CREATININE 1.80* 1.61* 1.05* 0.84  CALCIUM 8.8* 8.6* 8.3* 8.0*   Liver Function Tests:  Recent Labs Lab 09/15/15 1638 09/16/15 0433  AST 27 23  ALT 19 17  ALKPHOS 54 52  BILITOT 2.4* 2.5*  PROT 6.4* 6.2*  ALBUMIN 3.2* 3.0*   No results for input(s): LIPASE, AMYLASE in the last 168 hours. No results for input(s): AMMONIA in the last 168 hours. CBC:  Recent Labs Lab 09/15/15 1638 09/16/15 0433 09/19/15 0523 09/20/15 0546  WBC 10.3 9.8 8.9 8.8  NEUTROABS 7.6  --   --   --   HGB 10.3* 10.0* 10.0* 9.2*  HCT 31.2* 31.8* 31.9* 28.3*  MCV 92.6 97.2 94.9 95.0  PLT 303 309 296 278   Cardiac Enzymes:  Recent Labs Lab 09/20/15 0944 09/20/15 1450 09/20/15 2143  TROPONINI <0.03 <0.03 0.04*   BNP: Invalid input(s): POCBNP CBG:  Recent Labs Lab 09/21/15 1110 09/21/15 1610 09/21/15 2119 09/22/15 0626 09/22/15 1216  GLUCAP 135* 112* 150* 115* 125*    Surgical PCR screen     Status: None   Collection Time: 09/18/15  6:23 AM  Result Value Ref Range Status   MRSA, PCR NEGATIVE NEGATIVE Final   Staphylococcus aureus NEGATIVE NEGATIVE Final  Urine culture     Status: None   Collection Time: 09/19/15  5:32 PM  Result Value Ref Range Status   Specimen Description URINE, CATHETERIZED  Final   Culture NO GROWTH 2 DAYS  Final   Report Status 09/21/2015 FINAL  Final     Scheduled Meds: . bisoprolol-hydrochloroth  1 tablet Oral BID  . cefTRIAXone  1 g Intravenous Q24H  . cholecalciferol  2,000 Units Oral Daily  . cyclobenzaprine  10 mg Oral QHS  . docusate sodium  100 mg Oral BID  . insulin aspart  0-5 Units Subcutaneous QHS  . insulin aspart  0-9 Units Subcutaneous TID WC  . losartan  100 mg Oral Daily  .  oxybutynin  5 mg Oral BID  . pantoprazole  40 mg Oral Daily  . rosuvastatin  10 mg Oral Q M,W,F-2000  . senna  1 tablet Oral BID  . warfarin  4 mg Oral ONCE-1800   Continuous Infusions: . sodium chloride 75 mL/hr at 09/19/15 2310

## 2015-09-22 NOTE — Progress Notes (Signed)
ANTICOAGULATION CONSULT NOTE - Follow Up Consult  Pharmacy Consult for Coumadin Indication: atrial fibrillation  Allergies  Allergen Reactions  . Nsaids     On coumadin  . Statins     Cause leg cramps  Patient Measurements: Height: 5\' 7"  (170.2 cm) Weight: 239 lb (108.41 kg) IBW/kg (Calculated) : 61.6 Vital Signs: Temp: 98 F (36.7 C) (03/27 0615) Temp Source: Oral (03/27 0615) BP: 118/65 mmHg (03/27 0615) Pulse Rate: 89 (03/27 0615) Labs:  Recent Labs  09/20/15 0546 09/20/15 0944 09/20/15 1450 09/20/15 2143 09/21/15 0601 09/22/15 0440  HGB 9.2*  --   --   --   --   --   HCT 28.3*  --   --   --   --   --   PLT 278  --   --   --   --   --   LABPROT 17.0*  --   --   --  16.7* 15.5*  INR 1.37  --   --   --  1.34 1.22  CREATININE 0.84  --   --   --   --   --   TROPONINI  --  <0.03 <0.03 0.04*  --   --    Estimated Creatinine Clearance: 68.8 mL/min (by C-G formula based on Cr of 0.84).  Assessment: 82 year old man on chronic Coumadin for atrial fibrillation.  Coumadin was placed on hold and reversed with Vitamin K 10mg  IV daily x3 doses from 3/22 to 3/24 for thoracic decompression. Drain was removed 3/25 and Coumadin was resumed 3/26.   Home dose was 2mg  daily except 3mg  on Tuesdays and Fridays.   Current INR is low and dropped at 1.22 despite 4mg  given on 3/26. This is likely due to vitamin K resistance. No bleeding reported.   Goal of Therapy:  INR 2-3 Monitor platelets by anticoagulation protocol: Yes   Plan:  Coumadin 4mg  again today (higher than home dose due to vitamin K resistance). Daily PT/INR  Sloan Leiter, PharmD, BCPS Clinical Pharmacist [phone] 09/22/2015,11:18 AM

## 2015-09-22 NOTE — Care Management Important Message (Signed)
Important Message  Patient Details  Name: [name] [name] MRN: [id number] Date of Birth: 03/04/1934   Medicare Important Message Given:  Yes    Loann Quill 09/22/2015, 1:02 PM

## 2015-09-22 NOTE — Progress Notes (Signed)
Physical Therapy Treatment Patient Details Name: [name] [name] MRN: [id number] DOB: 09/29/1933 Today's Date: 09/22/2015    History of Present Illness 82 y.o. female transferred from [facility] with right leg pain and weakness with neuro deficits on exam in the ED.  MRI of her thoracic spine showed multilevel spondylosis with spinal cord compression.  Pt is now s/p T2-3, 3-4, and 5-6 laminectomies for decompression.  Pt with significant PMHx of A-fib, HTN, DM, neck surgery, back surgery, bil TKA, and bil THA.      PT Comments    Patient is progressing gradually toward mobility goals. Stedy used for sit to stand from EOB. Continue to progress as tolerated with anticipated d/c to SNF for further skilled PT services.    Follow Up Recommendations  SNF     Equipment Recommendations  None recommended by PT    Recommendations for Other Services       Precautions / Restrictions Precautions Precautions: Fall;Back Precaution Comments: Pt able to verbally recall 2/3 back precautions. Reviewed all precautions with pt. Restrictions Weight Bearing Restrictions: No    Mobility  Bed Mobility Overal bed mobility: Needs Assistance Bed Mobility: Rolling;Sidelying to Sit;Sit to Sidelying Rolling: Mod assist Sidelying to sit: Mod assist (for LEs and trunk)     Sit to sidelying: Mod assist General bed mobility comments: cues for sequencing and technique with carry over demonstrated; assistance required to mobilize bilat LE into sidelying, to EOB, and elevate back into bed; pt with improved ability to elevate trunk into sitting  Transfers Overall transfer level: Needs assistance   Transfers: Sit to/from Stand           General transfer comment: sit to stand X 3 from EOB with cues for hand placement, technique with use of momentum and max A +2 (bed pad) to power up into standing  Ambulation/Gait                 Stairs            Wheelchair Mobility    Modified Rankin (Stroke  Patients Only)       Balance Overall balance assessment: Needs assistance Sitting-balance support: Bilateral upper extremity supported;Feet supported Sitting balance-Leahy Scale: Fair     Standing balance support: Bilateral upper extremity supported Standing balance-Leahy Scale: Zero                      Cognition Arousal/Alertness: Awake/alert Behavior During Therapy: WFL for tasks assessed/performed Overall Cognitive Status: Within Functional Limits for tasks assessed       Memory: Decreased recall of precautions              Exercises      General Comments        Pertinent Vitals/Pain Pain Assessment: Faces Faces Pain Scale: Hurts even more Pain Location: back and stomach Pain Descriptors / Indicators: Sore Pain Intervention(s): Limited activity within patient's tolerance;Monitored during session;Premedicated before session;Repositioned    Home Living                      Prior Function            PT Goals (current goals can now be found in the care plan section) Acute Rehab PT Goals Patient Stated Goal: to get stronger and back to being more independent, walking with a walker Progress towards PT goals: Progressing toward goals    Frequency  Min 5X/week    PT Plan Current plan remains appropriate  Co-evaluation             End of Session Equipment Utilized During Treatment: Gait belt Activity Tolerance: Patient limited by fatigue Patient left: in bed;with call bell/phone within reach     Time: [id number] PT Time Calculation (min) (ACUTE ONLY): 28 min  Charges:  $Therapeutic Activity: 23-37 mins                    G Codes:      [place] April, PTA Pager: [phone]   09/22/2015, 3:40 PM

## 2015-09-23 DIAGNOSIS — M6281 Muscle weakness (generalized): Secondary | ICD-10-CM | POA: Diagnosis not present

## 2015-09-23 DIAGNOSIS — M4804 Spinal stenosis, thoracic region: Secondary | ICD-10-CM | POA: Diagnosis not present

## 2015-09-23 DIAGNOSIS — R262 Difficulty in walking, not elsewhere classified: Secondary | ICD-10-CM | POA: Diagnosis not present

## 2015-09-23 DIAGNOSIS — E785 Hyperlipidemia, unspecified: Secondary | ICD-10-CM | POA: Diagnosis not present

## 2015-09-23 DIAGNOSIS — F4320 Adjustment disorder, unspecified: Secondary | ICD-10-CM | POA: Diagnosis not present

## 2015-09-23 DIAGNOSIS — M79604 Pain in right leg: Secondary | ICD-10-CM | POA: Diagnosis not present

## 2015-09-23 DIAGNOSIS — I10 Essential (primary) hypertension: Secondary | ICD-10-CM

## 2015-09-23 DIAGNOSIS — M546 Pain in thoracic spine: Secondary | ICD-10-CM | POA: Diagnosis not present

## 2015-09-23 DIAGNOSIS — M21371 Foot drop, right foot: Secondary | ICD-10-CM | POA: Diagnosis not present

## 2015-09-23 DIAGNOSIS — Z7901 Long term (current) use of anticoagulants: Secondary | ICD-10-CM | POA: Diagnosis not present

## 2015-09-23 DIAGNOSIS — E876 Hypokalemia: Secondary | ICD-10-CM | POA: Diagnosis not present

## 2015-09-23 DIAGNOSIS — I4891 Unspecified atrial fibrillation: Secondary | ICD-10-CM | POA: Diagnosis not present

## 2015-09-23 DIAGNOSIS — N3281 Overactive bladder: Secondary | ICD-10-CM | POA: Diagnosis not present

## 2015-09-23 DIAGNOSIS — R300 Dysuria: Secondary | ICD-10-CM | POA: Diagnosis not present

## 2015-09-23 DIAGNOSIS — G9529 Other cord compression: Secondary | ICD-10-CM | POA: Diagnosis not present

## 2015-09-23 DIAGNOSIS — M4326 Fusion of spine, lumbar region: Secondary | ICD-10-CM | POA: Diagnosis not present

## 2015-09-23 DIAGNOSIS — M4324 Fusion of spine, thoracic region: Secondary | ICD-10-CM | POA: Diagnosis not present

## 2015-09-23 DIAGNOSIS — N390 Urinary tract infection, site not specified: Secondary | ICD-10-CM | POA: Diagnosis not present

## 2015-09-23 DIAGNOSIS — E43 Unspecified severe protein-calorie malnutrition: Secondary | ICD-10-CM | POA: Diagnosis not present

## 2015-09-23 DIAGNOSIS — R29898 Other symptoms and signs involving the musculoskeletal system: Secondary | ICD-10-CM | POA: Diagnosis not present

## 2015-09-23 DIAGNOSIS — D62 Acute posthemorrhagic anemia: Secondary | ICD-10-CM | POA: Diagnosis not present

## 2015-09-23 DIAGNOSIS — E46 Unspecified protein-calorie malnutrition: Secondary | ICD-10-CM | POA: Diagnosis not present

## 2015-09-23 DIAGNOSIS — L299 Pruritus, unspecified: Secondary | ICD-10-CM | POA: Diagnosis not present

## 2015-09-23 DIAGNOSIS — K219 Gastro-esophageal reflux disease without esophagitis: Secondary | ICD-10-CM | POA: Diagnosis not present

## 2015-09-23 DIAGNOSIS — R600 Localized edema: Secondary | ICD-10-CM | POA: Diagnosis not present

## 2015-09-23 DIAGNOSIS — M5124 Other intervertebral disc displacement, thoracic region: Secondary | ICD-10-CM | POA: Diagnosis not present

## 2015-09-23 DIAGNOSIS — M549 Dorsalgia, unspecified: Secondary | ICD-10-CM | POA: Diagnosis not present

## 2015-09-23 DIAGNOSIS — I482 Chronic atrial fibrillation: Secondary | ICD-10-CM | POA: Diagnosis not present

## 2015-09-23 DIAGNOSIS — G9520 Unspecified cord compression: Secondary | ICD-10-CM | POA: Diagnosis not present

## 2015-09-23 DIAGNOSIS — L8990 Pressure ulcer of unspecified site, unspecified stage: Secondary | ICD-10-CM | POA: Diagnosis not present

## 2015-09-23 DIAGNOSIS — K5900 Constipation, unspecified: Secondary | ICD-10-CM | POA: Diagnosis not present

## 2015-09-23 LAB — PROTIME-INR
INR: 1.25 (ref 0.00–1.49)
Prothrombin Time: 15.8 seconds — ABNORMAL HIGH (ref 11.6–15.2)

## 2015-09-23 LAB — GLUCOSE, CAPILLARY
Glucose-Capillary: 114 mg/dL — ABNORMAL HIGH (ref 65–99)
Glucose-Capillary: 154 mg/dL — ABNORMAL HIGH (ref 65–99)

## 2015-09-23 LAB — TROPONIN I: Troponin I: <0.03 ng/mL (ref <0.031)

## 2015-09-23 MED ORDER — PANTOPRAZOLE SODIUM 40 MG PO TBEC: 40.0000 mg | DELAYED_RELEASE_TABLET | Freq: Every day | ORAL | Status: DC

## 2015-09-23 MED ORDER — SENNA 8.6 MG PO TABS: 1.0000 | ORAL_TABLET | Freq: Two times a day (BID) | ORAL | Status: DC

## 2015-09-23 MED ORDER — ACETAMINOPHEN 325 MG PO TABS: 650.0000 mg | ORAL_TABLET | ORAL | Status: AC | PRN

## 2015-09-23 MED ORDER — WARFARIN SODIUM 4 MG PO TABS
4.0000 mg | ORAL_TABLET | Freq: Once | ORAL | Status: DC
  Filled 2015-09-23: qty 1

## 2015-09-23 MED ORDER — WARFARIN SODIUM 4 MG PO TABS: 4.0000 mg | ORAL_TABLET | ORAL | Status: DC

## 2015-09-23 MED ORDER — OXYCODONE-ACETAMINOPHEN 5-325 MG PO TABS: 1.0000 | ORAL_TABLET | ORAL | Status: DC | PRN

## 2015-09-23 MED ORDER — ENSURE ENLIVE PO LIQD: 237.0000 mL | Freq: Two times a day (BID) | ORAL | Status: DC

## 2015-09-23 NOTE — Progress Notes (Signed)
Reviewed discharge information/medications with patient. Also called report to [facility] and talked to [place]. Patient is going to be picked up at 2.  Ilene Qua 1:36 PM 09/23/2015

## 2015-09-23 NOTE — Progress Notes (Signed)
No acute events AVSS Awake and alert Slightly improved lower extremity strength Stable Ready for d/c Follow up with me in 2 weeks for wound check

## 2015-09-23 NOTE — Care Management Note (Signed)
Case Management Note  Patient Details  Name: [name] [name] MRN: [id number] Date of Birth: 1933-12-07  Subjective/Objective:            S/p laminectomy T2-3, T 3-4,  T 5-6         Action/Plan: PT/OT recommending SNF, referral made to CSW. CSW working on SNF placement for short term rehab.     Expected Discharge Date:   (unknown)               Expected Discharge Plan:  Skilled Nursing Facility  In-House Referral:  Clinical Social Work  Discharge planning Services  CM Consult  Post Acute Care Choice:    Choice offered to:  Patient  DME Arranged:    DME Agency:     HH Arranged:    [place] Agency:     Status of Service:  Completed, signed off  Medicare Important Message Given:  Yes Date Medicare IM Given:    Medicare IM give by:    Date Additional Medicare IM Given:    Additional Medicare Important Message give by:     If discussed at [place] of Stay Meetings, dates discussed:    Additional Comments:  Nila Nephew, RN 09/23/2015, 12:35 PM

## 2015-09-23 NOTE — Clinical Social Work Note (Addendum)
Patient to be d/c'ed today to [facility].  Patient and family agreeable to plans will transport via ems RN to call report to [phone].  Cheskel Silverio, MSW, LCSWA [phone]  

## 2015-09-23 NOTE — Discharge Summary (Signed)
Physician Discharge Summary  [name] [name] [id number] DOB: 1934/03/19 DOA: 09/15/2015  PCP: Gennette Pac, MD  Admit date: 09/15/2015 Discharge date: 09/23/2015  Recommendations for Outpatient Follow-up:  Patient is on Coumadin. She will get Coumadin 4 mg tonight 09/23/2015. Please continue to check INR daily until goal INR 2-3. Please adjust Coumadin dose to reflect the goal INR 2-3.  Discharge Diagnoses:  Active Problems:   Atrial fibrillation (HCC)   History of lumbar fusion   Right foot drop   Cord compression [facility])    Discharge Condition: stable   Diet recommendation: as tolerated   History of present illness:  82 year old female with past medical history of atrial fibrillation, on anticoagulation with Coumadin, hypertension, dyslipidemia who was reporting ongoing bilateral leg weakness for about 2 weeks prior to the admission. She reports she could not participate with the physical therapy at home which was ordered for THAs and TKAs. She presented to [facility] because she could not take care of herself in any possible way in because of ongoing pain and weakness. Patient is status post thoracic laminectomies for decompression, T2-T3, T3-T4, T5-T6 on 09/19/2015 by neurosurgery.  Hospital Course:   Assessment/Plan:    Principal problem: Symptomatic cord compression at T3-4 and T5-6 secondary to spondylosis with thoracic myelopathy, thoracic stenosis T2-3, T3-4, T5-6 - Two levels of probably symptomatic cord compression at T3-4 and T5-6. Findings are primarily secondary to spondylosis at T3-4 and a central disc extrusion at T5-6 per MRI 09/17/2015  - Per PT - SNF placement   Active Problems: Atrial fibrillation - CHADS vasc score 3 - Continue anticoagulation with coumadin - As noted above, continue 4 mg of Coumadin tonight, continue to monitor INR daily until goal INR 2-3. Please adjust Coumadin dose to reflect goal INR 2-3 - Rate controlled with  Ziac  Essential hypertension - Continue Ziac, losartan  Dyslipidemia  - Continue Crestor   UTI - Urine culture showed no growth.  - We stopped Rocephin 09/22/2015  Elevated troponin level - Mild trop level 0.04 but 2 other levels WNL - Likely demand ischemia from acute illness, cord compression - 3 sets of cardiac enzymes repeated 09/22/2015 and all within normal limits   DVT Prophylaxis  - On full dose anticoagulation with Coumadin   Code Status: DNR/DNI Family Communication: plan of care discussed with the patient; family not at the bedside this morning   IV access:  Peripheral IV  Procedures and diagnostic studies:   Dg Cervical Spine 2-3 Views 09/19/2015 Surgical marking in the region of T2-T3.  Mr Thoracic Spine Wo Contrast 09/17/2015 Two levels of probably symptomatic cord compression at T3-4 and T5-6. Findings are primarily secondary to spondylosis at T3-4 and a central disc extrusion at T5-6. At both levels, there is mild cord flattening and possible abnormal cord signal which may reflect edema or myelomalacia. 2. Mild degenerative changes at the other levels as described without definite cord compression. 3. No acute osseous findings. 4. Previous cervical and thoracic fusion. 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   Dg Chest Port 1 View 09/17/2015 No active disease.   Medical Consultants:  Neurosurgery  IAnti-Infectives:   None   Signed:  Leisa Lenz, MD  [facility] 09/23/2015, 11:50 AM  Pager #: [phone]  Time spent in minutes: more than 30 minutes    Discharge Exam: Filed Vitals:   09/23/15 0500 09/23/15 0800  BP: 115/66 115/66  Pulse: 71 80  Temp: 98.4 F (36.9 C)   Resp: 16  Filed Vitals:   09/22/15 0615 09/22/15 2159 09/23/15 0500 09/23/15 0800  BP: 118/65 122/76 115/66 115/66  Pulse: 89 89 71 80  Temp: 98 F (36.7 C) 98.7 F (37.1 C) 98.4 F (36.9 C)   TempSrc: Oral Oral    Resp: 16 17 16    Height:       Weight:      SpO2: 97% 98% 98% 99%    General: Pt is alert, follows commands appropriately, not in acute distress Cardiovascular: Regular rate and rhythm, S1/S2 +, no murmurs Respiratory: Clear to auscultation bilaterally, no wheezing, no crackles, no rhonchi Abdominal: Soft, non tender, non distended, bowel sounds +, no guarding Extremities: no edema, no cyanosis, pulses palpable bilaterally DP and PT Neuro: Grossly nonfocal  Discharge Instructions  Discharge Instructions    Call MD for:  difficulty breathing, headache or visual disturbances    Complete by:  As directed      Call MD for:  persistant dizziness or light-headedness    Complete by:  As directed      Call MD for:  persistant nausea and vomiting    Complete by:  As directed      Call MD for:  severe uncontrolled pain    Complete by:  As directed      Diet - low sodium heart healthy    Complete by:  As directed      Discharge instructions    Complete by:  As directed   Patient is on Coumadin. She will get Coumadin 4 mg tonight 09/23/2015. Please continue to check INR daily until goal INR 2-3. Please adjust Coumadin dose to reflect the goal INR 2-3.     Increase activity slowly    Complete by:  As directed             Medication List    STOP taking these medications        BENEFIBER Powd     HYDROcodone-acetaminophen 5-325 MG tablet  Commonly known as:  NORCO      TAKE these medications        acetaminophen 325 MG tablet  Commonly known as:  TYLENOL  Take 2 tablets (650 mg total) by mouth every 4 (four) hours as needed (temp > 100.5).     bisoprolol-hydrochlorothiazide 10-6.25 MG tablet  Commonly known as:  ZIAC  Take 1 tablet by mouth 2 (two) times daily.     calcium carbonate 750 MG chewable tablet  Commonly known as:  TUMS EX  Chew 2 tablets by mouth daily as needed for heartburn.     cyclobenzaprine 5 MG tablet  Commonly known as:  FLEXERIL  Take 1 tablet (5 mg total) by mouth 2 (two) times  daily as needed for muscle spasms.     feeding supplement (ENSURE ENLIVE) Liqd  Take 237 mLs by mouth 2 (two) times daily between meals.     hydrocortisone cream 1 %  Apply 1 application topically daily as needed for itching.     loperamide 2 MG tablet  Commonly known as:  IMODIUM A-D  Take 4 mg by mouth 4 (four) times daily as needed for diarrhea or loose stools.     losartan 100 MG tablet  Commonly known as:  COZAAR  Take 100 mg by mouth daily.     multivitamin with minerals Tabs tablet  Take 1 tablet by mouth daily.     oxybutynin 5 MG tablet  Commonly known as:  DITROPAN  Take 5 mg by  mouth 2 (two) times daily.     oxyCODONE-acetaminophen 5-325 MG tablet  Commonly known as:  PERCOCET/ROXICET  Take 1-2 tablets by mouth every 4 (four) hours as needed for moderate pain.     pantoprazole 40 MG tablet  Commonly known as:  PROTONIX  Take 1 tablet (40 mg total) by mouth daily.     ranitidine 75 MG tablet  Commonly known as:  ZANTAC  Take 75 mg by mouth daily as needed for heartburn.     rosuvastatin 10 MG tablet  Commonly known as:  CRESTOR  Take 10 mg by mouth every Monday, Wednesday, and Friday. m w f at bedtime     senna 8.6 MG Tabs tablet  Commonly known as:  SENOKOT  Take 1 tablet (8.6 mg total) by mouth 2 (two) times daily.     Vitamin D 2000 units tablet  Take 2,000 Units by mouth daily.     warfarin 4 MG tablet  Commonly known as:  COUMADIN  Take 1 tablet (4 mg total) by mouth See admin instructions. Take 2mg s daily on Sun,Mon,Wed,Thurs,Sat and take 3mg s on Tues and Fri           Follow-up Information    Follow up with Gennette Pac, MD. Schedule an appointment as soon as possible for a visit in 1 week.   Specialty:  Family Medicine   Why:  Follow up appt after recent hospitalization   Contact information:   [place] [address] [phone]        The results of significant diagnostics from this hospitalization  (including imaging, microbiology, ancillary and laboratory) are listed below for reference.    Significant Diagnostic Studies: Dg Cervical Spine 2-3 Views  09/19/2015  CLINICAL DATA:  T2-T3 thoracic laminectomy. EXAM: CERVICAL SPINE - 2-3 VIEW COMPARISON:  None. FINDINGS: Two intraoperative images were submitted. On image number 1, there is a surgical marker along the posterior aspect of the cervicothoracic junction. On the second image, there are surgical instruments in the region of T2-T3 IMPRESSION: Surgical marking in the region of T2-T3. Electronically Signed   By: Markus Daft M.D.   On: 09/19/2015 09:57   Dg Lumbar Spine 2-3 Views  09/15/2015  CLINICAL DATA:  Pre mri images; c/o abdominal spasms, numerous previous spinal surgeries EXAM: LUMBAR SPINE - 2-3 VIEW COMPARISON:  None. FINDINGS: Spinal rods throughout the lumbar spine. Bilateral hip replacements. Spinal rods extend from T12 through L5. Cross-table lateral extremely limited. There appears to be interbody fusion device at L4-5, L3-4, and L2-3. IMPRESSION: Extensive prior surgery. Electronically Signed   By: Skipper Cliche M.D.   On: 09/15/2015 20:47   Dg Pelvis 1-2 Views  09/10/2015  CLINICAL DATA:  Severe right-sided pelvic and thigh pain since last night. No known injury. EXAM: PELVIS - 1-2 VIEW COMPARISON:  10/28/2008 FINDINGS: There is no fracture or bone destruction or other acute abnormality. The bowel gas pattern is normal. Bilateral total hip prostheses. Extensive lumbar fusion and posterior decompression. Dystrophic calcifications in the soft tissues adjacent to the right greater trochanter. IMPRESSION: No acute abnormalities. Electronically Signed   By: Lorriane Shire M.D.   On: 09/10/2015 14:53   Mr Thoracic Spine Wo Contrast  09/17/2015  ADDENDUM REPORT: 09/17/2015 13:04 ADDENDUM: Critical Value/emergent results were called by telephone at the time of interpretation on 09/17/2015 at 1:00 pm toDr. Kerney Elbe, who verbally  acknowledged these results. Electronically Signed   By: Richardean Sale M.D.   On: 09/17/2015  13:04  09/17/2015  CLINICAL DATA:  Bilateral lower extremity weakness with limited ability to walk over the last week. Evaluate for cord compression. EXAM: MRI THORACIC SPINE WITHOUT CONTRAST TECHNIQUE: Multiplanar, multisequence MR imaging of the thoracic spine was performed. No intravenous contrast was administered. COMPARISON:  Lumbar MRI 09/15/2015.  Lumbar myelogram CT 12/08/2005. FINDINGS: Alignment:  Normal. Bones: The sagittal localizing images through the cervical spine demonstrate previous C4-6 ACDF. Patient is also status post thoracolumbar fusion extending from T12 through L5. Within the thoracic spine, no evidence of acute fracture or worrisome osseous lesion. There are endplate degenerative changes anteriorly at T3-4. There is also prominent adjacent segment disease at T11-12 with T2 hyperintensity in the disc, but no endplate destruction. The thoracic spinal canal is relatively small on a congenital basis. Cord: The CSF surrounding the cord is effaced at multiple levels. There is mild cord flattening with possible associated T2 hyperintensity at the T3-4 and T5-6 levels, best seen on the sagittal images. The distal thoracic cord and conus medullaris appear unchanged. Paraspinal and other soft tissues: No significant paraspinal abnormalities. Hiatal hernia noted. Disc levels: T1-2: Disc bulging with posterior osteophytes. No cord compression or high-grade foraminal narrowing. T2-3: Disc bulging with posterior osteophytes contributing to partial effacement of the CSF surrounding the cord. There is no cord deformity. The left foramen appears mildly narrowed. T3-4: Disc degeneration with broad-based central disc protrusion and osteophytes. There is facet and ligamentous hypertrophy. These factors contribute to the effacement of the CSF surrounding the cord and mild cord flattening. The AP diameter of the canal  is approximately 5 mm. There is right-greater-than-left foraminal narrowing. T4-5: Anterior osteophytes. No significant spinal stenosis or nerve root encroachment. T5-6: Moderate size central disc extrusion is best seen on the sagittal images, causing cord compression and narrowing the AP diameter of the canal to 4 mm. The foramina appear sufficiently patent. From T6-7 through T10-11, there is mild disc degeneration with anterior osteophytes, but no cord compression or significant foraminal compromise. T11-12: Adjacent segment disease with T2 hyperintensity in the disc and prominent anterior osteophytes. Mild bilateral facet hypertrophy. No cord deformity or significant foraminal compromise. T12-L1: Solid interbody fusion. No spinal stenosis or nerve root encroachment. IMPRESSION: 1. Two levels of probably symptomatic cord compression at T3-4 and T5-6. Findings are primarily secondary to spondylosis at T3-4 and a central disc extrusion at T5-6. At both levels, there is mild cord flattening and possible abnormal cord signal which may reflect edema or myelomalacia. 2. Mild degenerative changes at the other levels as described without definite cord compression. 3. No acute osseous findings. 4. Previous cervical and thoracic fusion. 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 Electronically Signed: By: Richardean Sale M.D. On: 09/17/2015 12:52   Mr Lumbar Spine Wo Contrast  09/15/2015  CLINICAL DATA:  Initial evaluation for right foot drop. EXAM: MRI LUMBAR SPINE WITHOUT CONTRAST TECHNIQUE: Multiplanar, multisequence MR imaging of the lumbar spine was performed. No intravenous contrast was administered. COMPARISON:  Prior radiograph from earlier the same day. FINDINGS: For the purposes of this dictation, lowest well-formed intervertebral disc spaces presumed to be the L5-S1 level, and there presumed to be 5 lumbar type vertebral bodies. Dextroscoliosis with apex at the L2-3 level. Vertebral bodies otherwise normally aligned  with preservation of the normal lumbar lordosis. Vertebral body heights maintained. No evidence for fracture. Signal intensity within the vertebral body bone marrow within normal limits. Postoperative changes from fairly extensive remote thoracolumbar fusion and decompression present. Patient is fused from the T12  through L5 levels. Bilateral transpedicular screws in place at T12 L4 and L5. Right-sided transpedicular screws in place at L1 and L2, with a left-sided transpedicular screw in place at L3. Interbody fusions in place at L2-3, L3-4, and L4-5. Essentially complete ankylosis/ fusion at the T12-L1 and L1-2 levels as well. No complication about the hardware seen on this examination. Conus medullaris terminates normally at the T12-L1 level. Signal intensity within the visualized cord is normal. Nerve roots of the cauda equina within normal limits. No acute paraspinous soft tissue abnormality. Fatty atrophy noted within the paraspinous and psoas musculature, greater on the left. Partially visualized urinary bladder appears moderately distended. T10-11: Seen only on sagittal projection. Disc desiccation without significant disc bulge. Mild canal stenosis. No significant foraminal narrowing. T11-12: Disc desiccation with mild annular disc bulge. Endplate osteophytic spurring along the right anterior lateral aspect of the T11-12 disc space. Bilateral facet arthrosis. Resultant mild canal stenosis. Mild bilateral foraminal narrowing. T12-L1: Status post fusion. Bilateral facet arthrosis. Prominent osteophytic overgrowth along the right anterolateral and lateral aspect of the fused T12-L1 disc space. Mild right lateral recess stenosis. Mild foraminal narrowing bilaterally. L1-2: Status post fusion and posterior decompression. Small central osseous ridge or possibly residual disc material indents the ventral thecal sac without significant canal stenosis. Foramina grossly patent. L2-3: Status post decompression with  fusion. Central canal widely patent. No significant foraminal stenosis. L3-4: Status post decompression with fusion. Central canal widely patent. Probable mild left foraminal narrowing. Right neural foramen widely patent. L4-5: Status post wide decompression with fusion. Central canal widely patent. Bilateral facet arthrosis. Mild to moderate right foraminal narrowing related to facet disease. Left neural foramina widely patent. L5-S1: Diffuse degenerative intervertebral disc space narrowing with disc desiccation and disc bulge. No focal disc protrusion. Moderate to severe bilateral facet arthrosis. Fairly severe right foraminal narrowing related to disc bulge and osteophytic overgrowth at the hypertrophied right L5-S1 facet (series 3, image 6). More mild left foraminal stenosis. Mild to moderate canal narrowing. IMPRESSION: 1. Postoperative changes from remote thoracolumbar fusion and decompression at T12 through L5 as above. No significant canal or foraminal stenosis at these levels. 2. Multifactorial degenerative spondylolysis at L5-S1 with resultant severe right foraminal stenosis. This could potentially result in the patient's right lower extremity symptoms. 3. Dextroscoliosis with additional more mild multilevel degenerative changes as above. Please see above report for a full description of these findings. 4. Age-related fatty atrophy within the paraspinous and psoas musculature. Electronically Signed   By: Jeannine Boga M.D.   On: 09/15/2015 22:04   Dg Chest Port 1 View  09/17/2015  CLINICAL DATA:  Leg weakness EXAM: PORTABLE CHEST 1 VIEW COMPARISON:  10/29/2008 FINDINGS: Normal heart size. Lungs clear. No pneumothorax. No pleural effusion. Severe chronic changes at both shoulder joints with chronic rotator cuff tearing. IMPRESSION: No active disease. Electronically Signed   By: Marybelle Killings M.D.   On: 09/17/2015 15:54   Dg Knee Complete 4 Views Right  09/10/2015  CLINICAL DATA:  82 year old  female with a history of right knee pain. EXAM: RIGHT KNEE - COMPLETE 4+ VIEW COMPARISON:  None. FINDINGS: Surgical changes of prior right knee arthroplasty. No fracture identified. No joint effusion or significant soft tissue swelling. Vascular calcifications of the femoral popliteal system. IMPRESSION: Surgical changes of right knee arthroplasty, with no fracture identified. Vascular calcifications. Signed, Dulcy Fanny. Earleen Newport, DO [facility] Electronically Signed   By: Corrie Mckusick D.O.   On: 09/10/2015 10:58  Dg Femur, Min 2 Views Right  09/10/2015  CLINICAL DATA:  Right-sided femur pain, no known injury, initial encounter EXAM: RIGHT FEMUR 2 VIEWS COMPARISON:  None. FINDINGS: Right hip and knee prostheses are seen. No acute fracture or loosening is noted. No soft tissue abnormality is seen. IMPRESSION: Postsurgical changes without acute abnormality. Electronically Signed   By: Inez Catalina M.D.   On: 09/10/2015 14:50    Microbiology: Recent Results (from the past 240 hour(s))  Surgical PCR screen     Status: None   Collection Time: 09/18/15  6:23 AM  Result Value Ref Range Status   MRSA, PCR NEGATIVE NEGATIVE Final   Staphylococcus aureus NEGATIVE NEGATIVE Final    Comment:        The Xpert SA Assay (FDA approved for NASAL specimens in patients over 78 years of age), is one component of a comprehensive surveillance program.  Test performance has been validated by [facility] for patients greater than or equal to 81 year old. It is not intended to diagnose infection nor to guide or monitor treatment.   Urine culture     Status: None   Collection Time: 09/19/15  5:32 PM  Result Value Ref Range Status   Specimen Description URINE, CATHETERIZED  Final   Special Requests NONE  Final   Culture NO GROWTH 2 DAYS  Final   Report Status 09/21/2015 FINAL  Final     Labs: Basic Metabolic Panel:  Recent Labs Lab 09/19/15 0523  09/20/15 0546  NA 140 138  K 4.2 5.0  CL 106 106  CO2 27 24  GLUCOSE 138* 131*  BUN 31* 23*  CREATININE 1.05* 0.84  CALCIUM 8.3* 8.0*   Liver Function Tests: No results for input(s): AST, ALT, ALKPHOS, BILITOT, PROT, ALBUMIN in the last 168 hours. No results for input(s): LIPASE, AMYLASE in the last 168 hours. No results for input(s): AMMONIA in the last 168 hours. CBC:  Recent Labs Lab 09/19/15 0523 09/20/15 0546  WBC 8.9 8.8  HGB 10.0* 9.2*  HCT 31.9* 28.3*  MCV 94.9 95.0  PLT 296 278   Cardiac Enzymes:  Recent Labs Lab 09/20/15 1450 09/20/15 2143 09/22/15 1346 09/22/15 1840 09/23/15 0025  TROPONINI <0.03 0.04* 0.03 <0.03 <0.03   BNP: BNP (last 3 results) No results for input(s): BNP in the last 8760 hours.  ProBNP (last 3 results) No results for input(s): PROBNP in the last 8760 hours.  CBG:  Recent Labs Lab 09/22/15 0626 09/22/15 1216 09/22/15 1750 09/22/15 2208 09/23/15 0847  GLUCAP 115* 125* 133* 141* 114*

## 2015-09-23 NOTE — Progress Notes (Signed)
ANTICOAGULATION CONSULT NOTE - Follow Up Consult  Pharmacy Consult for Coumadin Indication: atrial fibrillation  Allergies  Allergen Reactions  . Nsaids     On coumadin  . Statins     Cause leg cramps  Patient Measurements: Height: 5\' 7"  (170.2 cm) Weight: 239 lb (108.41 kg) IBW/kg (Calculated) : 61.6 Vital Signs: Temp: 98.4 F (36.9 C) (03/28 0500) BP: 115/66 mmHg (03/28 0800) Pulse Rate: 80 (03/28 0800) Labs:  Recent Labs  09/21/15 0601 09/22/15 0440 09/22/15 1346 09/22/15 1840 09/23/15 0025  LABPROT 16.7* 15.5*  --   --  15.8*  INR 1.34 1.22  --   --  1.25  TROPONINI  --   --  0.03 <0.03 <0.03   Estimated Creatinine Clearance: 68.8 mL/min (by C-G formula based on Cr of 0.84).  Assessment: 82 year old female on chronic Coumadin for atrial fibrillation.  Coumadin was placed on hold and reversed with Vitamin K 10mg  IV daily x3 doses from 3/22 to 3/24 for thoracic decompression. Drain was removed 3/25 and Coumadin was resumed 3/26.   Home dose was 2mg  daily except 3mg  on Tuesdays and Fridays.   Current INR is low likely due to vitamin K resistance. No bleeding reported. Will continue with increased Coumadin doses.  Goal of Therapy:  INR 2-3 Monitor platelets by anticoagulation protocol: Yes   Plan:  Coumadin 4mg  again today (higher than home dose due to vitamin K resistance). Daily PT/INR  [employer], Pharm.D., BCPS Clinical Pharmacist Pager [phone] 09/23/2015 10:35 AM

## 2015-09-23 NOTE — Progress Notes (Signed)
Physical Therapy Treatment Patient Details Name: [name] [name] MRN: [id number] DOB: Jun 01, 1934 Today's Date: 09/23/2015    History of Present Illness 82 y.o. female transferred from [facility] with right leg pain and weakness with neuro deficits on exam in the ED.  MRI of her thoracic spine showed multilevel spondylosis with spinal cord compression.  Pt is now s/p T2-3, 3-4, and 5-6 laminectomies for decompression.  Pt with significant PMHx of A-fib, HTN, DM, neck surgery, back surgery, bil TKA, and bil THA.      PT Comments    Patient continues to make gradual progress toward goals. Continue to progress as tolerated until d/c.   Follow Up Recommendations  SNF     Equipment Recommendations  None recommended by PT    Recommendations for Other Services       Precautions / Restrictions Precautions Precautions: Fall;Back Precaution Comments: Pt able to verbally recall 3/3 back precautions. Reviewed all precautions with pt. Restrictions Weight Bearing Restrictions: No    Mobility  Bed Mobility Overal bed mobility: Needs Assistance Bed Mobility: Rolling;Sidelying to Sit Rolling: Mod assist Sidelying to sit: Mod assist       General bed mobility comments: cues for sequencing; assist to roll all the way into sidelying, bringing L LE to EOB and elevating trunk into sitting  Transfers Overall transfer level: Needs assistance Equipment used: None Transfers: Sit to/from Stand Sit to Stand: Max assist;+2 physical assistance;+2 safety/equipment;From elevated surface         General transfer comment: sit to stand X 4 from EOB with pt able to stand without use of bed pad to elevate hips from surface and with improved upright posture; max A +2 to power up into sitting and maintain balance in stand with bilat knees blocked and cues for technique; transferred to chair from EOB using stedy; educated RN on use of stedy for transfer back to bed   Ambulation/Gait                  Stairs            Wheelchair Mobility    Modified Rankin (Stroke Patients Only)       Balance     Sitting balance-Leahy Scale: Fair       Standing balance-Leahy Scale: Zero                      Cognition Arousal/Alertness: Awake/alert Behavior During Therapy: WFL for tasks assessed/performed Overall Cognitive Status: Within Functional Limits for tasks assessed                      Exercises      General Comments        Pertinent Vitals/Pain Pain Assessment: No/denies pain Pain Intervention(s): Monitored during session;Premedicated before session;Repositioned    Home Living                      Prior Function            PT Goals (current goals can now be found in the care plan section) Acute Rehab PT Goals Patient Stated Goal: to get stronger and back to being more independent, walking with a walker Progress towards PT goals: Progressing toward goals    Frequency  Min 5X/week    PT Plan Current plan remains appropriate    Co-evaluation             End of Session Equipment Utilized During Treatment: Gait  belt Activity Tolerance: Patient limited by fatigue Patient left: with call bell/phone within reach;in chair;with chair alarm set     Time: [phone] PT Time Calculation (min) (ACUTE ONLY): 33 min  Charges:  $Therapeutic Activity: 23-37 mins                    G Codes:      Salina April, PTA Pager: [phone]   09/23/2015, 10:08 AM

## 2015-09-24 ENCOUNTER — Encounter: Payer: Self-pay | Admitting: Internal Medicine

## 2015-09-24 ENCOUNTER — Non-Acute Institutional Stay (SKILLED_NURSING_FACILITY): Payer: Medicare Other | Admitting: Internal Medicine

## 2015-09-24 DIAGNOSIS — L299 Pruritus, unspecified: Secondary | ICD-10-CM

## 2015-09-24 DIAGNOSIS — I482 Chronic atrial fibrillation, unspecified: Secondary | ICD-10-CM

## 2015-09-24 DIAGNOSIS — K5900 Constipation, unspecified: Secondary | ICD-10-CM | POA: Diagnosis not present

## 2015-09-24 DIAGNOSIS — D62 Acute posthemorrhagic anemia: Secondary | ICD-10-CM | POA: Diagnosis not present

## 2015-09-24 DIAGNOSIS — R29898 Other symptoms and signs involving the musculoskeletal system: Secondary | ICD-10-CM

## 2015-09-24 DIAGNOSIS — K219 Gastro-esophageal reflux disease without esophagitis: Secondary | ICD-10-CM | POA: Diagnosis not present

## 2015-09-24 DIAGNOSIS — N390 Urinary tract infection, site not specified: Secondary | ICD-10-CM

## 2015-09-24 DIAGNOSIS — E46 Unspecified protein-calorie malnutrition: Secondary | ICD-10-CM

## 2015-09-24 DIAGNOSIS — I10 Essential (primary) hypertension: Secondary | ICD-10-CM

## 2015-09-24 DIAGNOSIS — L8990 Pressure ulcer of unspecified site, unspecified stage: Secondary | ICD-10-CM | POA: Diagnosis not present

## 2015-09-24 DIAGNOSIS — M4804 Spinal stenosis, thoracic region: Secondary | ICD-10-CM | POA: Diagnosis not present

## 2015-09-24 NOTE — Progress Notes (Signed)
LOCATION: [place]  PCP: Gennette Pac, MD   Code Status: DNR  Goals of care: Advanced Directive information Advanced Directives 09/24/2015  Does patient have an advance directive? Yes  Type of Advance Directive Out of facility DNR (pink MOST or yellow form)  Does patient want to make changes to advanced directive? No - Patient declined  Copy of advanced directive(s) in chart? Yes  Would patient like information on creating an advanced directive? -       Extended Emergency Contact Information Primary Emergency Contact: Aderhold,John Address: [address]          [place], [place] [address] [place] Phone: [phone] Relation: Son Secondary Emergency Contact: Krenzer,Gay  [place] Mobile Phone: [phone] Relation: Relative   Allergies  Allergen Reactions  . Nsaids     On coumadin  . Statins     Cause leg cramps    Chief Complaint  Patient presents with  . New Admit To SNF    New Admission     HPI:  Patient is a 82 y.o. female seen today for short term rehabilitation post hospital admission from 09/15/15-09/23/15 with bilateral lower extremity weakness. She was noted to have cord compression at T3-4 and T5-6 and thoracic myelopathy. She underwent thoracic laminectomies for decompression T2-T3, T3-T4, T5-T6 on 09/19/2015 by neurosurgery.She has past medical history of atrial fibrillation, hypertension, dyslipidemia among others. She is seen in her room today.    Review of Systems:  Constitutional: Negative for fever, chills, diaphoresis.  HENT: Negative for headache, congestion, nasal discharge, hearing loss, sore throat, difficulty swallowing.   Eyes: Negative for blurred vision, double vision and discharge.  Respiratory: Negative for cough, shortness of breath and wheezing.   Cardiovascular: Negative for chest pain, palpitations, leg swelling.  Gastrointestinal: Negative for heartburn, nausea, vomiting, abdominal pain.  Last bowel movement was 4 days ago.  Genitourinary: Negative for dysuria and flank pain.  Musculoskeletal: Negative for back pain, fall in the facility.  Skin: Negative for rash. Positive for itching at surgical site.  Neurological: Negative for dizziness. Positive for weakness in both legs. Psychiatric/Behavioral: Negative for depression   Past Medical History  Diagnosis Date  . Atrial fibrillation ([place])   . Hypertension   . Hyperlipidemia   . Osteoarthritis   . Diabetes ([place])     prediabetes   Past Surgical History  Procedure Laterality Date  . Wisdom tooth extraction    . Tonsillectomy    . Neck surgery    . Back surgery    . Knee surgery      2 total knees -right and left  . Total hip arthroplasty      right and left hips  . Cataract extraction  Apr 11, 2012, Jun 04 2012  . Carpal tunnel release Right   . Hip surgery      restrictor in left hip due to 3 hip dislocations  . Thoracic discectomy N/A 09/19/2015    Procedure: THORACIC LAMINECTOMY DECOMPRESSION T2-3, T3-4,T5-6;  Surgeon: Kevan Ny Ditty, MD;  Location: [place] [facility];  Service: Neurosurgery;  Laterality: N/A;  THORACIC LAMINECTOMY DECOMPRESSION T2-3, T3-4,T5-6   Social History:   reports that she quit smoking about 40 years ago. Her smoking use included Cigarettes. She quit after 20 years of use. She has never used smokeless tobacco. She reports that she drinks alcohol. She reports that she does not use illicit drugs.  Family History  Problem Relation Age of Onset  .  Heart disease Mother   . Colon cancer Father     dx in his late 24's, died age 56  . Uterine cancer Sister 72    spread to colon    Medications:   Medication List       This list is accurate as of: 09/24/15  1:48 PM.  Always use your most recent med list.               acetaminophen 325 MG tablet  Commonly known as:  TYLENOL  Take 2 tablets (650 mg total) by mouth every 4 (four) hours as needed (temp > 100.5).      bisoprolol-hydrochlorothiazide 10-6.25 MG tablet  Commonly known as:  ZIAC  Take 1 tablet by mouth 2 (two) times daily.     calcium carbonate 750 MG chewable tablet  Commonly known as:  TUMS EX  Chew 2 tablets by mouth daily as needed for heartburn.     cyclobenzaprine 5 MG tablet  Commonly known as:  FLEXERIL  Take 5 mg by mouth 2 (two) times daily as needed for muscle spasms.     hydrocortisone cream 1 %  Apply 1 application topically daily as needed for itching.     loperamide 2 MG tablet  Commonly known as:  IMODIUM A-D  Take 4 mg by mouth 4 (four) times daily as needed for diarrhea or loose stools.     losartan 100 MG tablet  Commonly known as:  COZAAR  Take 100 mg by mouth daily.     multivitamin with minerals Tabs tablet  Take 1 tablet by mouth daily.     oxybutynin 5 MG tablet  Commonly known as:  DITROPAN  Take 5 mg by mouth 2 (two) times daily.     oxyCODONE-acetaminophen 5-325 MG tablet  Commonly known as:  PERCOCET/ROXICET  Take 1-2 tablets by mouth every 4 (four) hours as needed for moderate pain.     pantoprazole 40 MG tablet  Commonly known as:  PROTONIX  Take 1 tablet (40 mg total) by mouth daily.     ranitidine 75 MG tablet  Commonly known as:  ZANTAC  Take 75 mg by mouth daily as needed for heartburn.     rosuvastatin 10 MG tablet  Commonly known as:  CRESTOR  Take 10 mg by mouth every Monday, Wednesday, and Friday. m w f at bedtime     senna 8.6 MG Tabs tablet  Commonly known as:  SENOKOT  Take 1 tablet (8.6 mg total) by mouth 2 (two) times daily.     UNABLE TO FIND  Med Name: Med pass 120 mL twice daily between meals     Vitamin D 2000 units tablet  Take 2,000 Units by mouth daily.     warfarin 2 MG tablet  Commonly known as:  COUMADIN  Take 2 mg by mouth. By mouth on Sunday, Monday, Wednesday, Thursday and Saturday     warfarin 3 MG tablet  Commonly known as:  COUMADIN  Take 3 mg by mouth. By mouth every Tuesday and Friday         Immunizations:  There is no immunization history on file for this patient.   Physical Exam: Filed Vitals:   09/24/15 1008  BP: 126/72  Pulse: 76  Temp: 98 F (36.7 C)  TempSrc: Oral  Resp: 20  Height: 5\' 7"  (1.702 m)  Weight: 239 lb (108.41 kg)  SpO2: 98%   Body mass index is 37.42 kg/(m^2).  General- elderly female,  obese, in no acute distress Head- normocephalic, atraumatic Nose-  no maxillary or frontal sinus tenderness, no nasal discharge Throat- moist mucus membrane Eyes- PERRLA, EOMI, no pallor, no icterus, no discharge, normal conjunctiva, normal sclera Neck- no cervical lymphadenopathy Cardiovascular- normal s1,s2, no murmur, trace leg edema Respiratory- bilateral clear to auscultation, no wheeze, no rhonchi, no crackles, no use of accessory muscles Abdomen- bowel sounds present, soft, non tender Musculoskeletal- able to move all 4 extremities, generalized weakness more to her lower extremities Neurological- alert and oriented to person, place and time Skin- warm and dry, thoracic region surgical incision with honeycomb dressing. Has dressing to her sacrum area, bruising to arms Psychiatry- normal mood and affect    Labs reviewed: Basic Metabolic Panel:  Recent Labs  09/16/15 0433 09/19/15 0523 09/20/15 09/20/15 0546  NA 140 140 138 138  K 4.8 4.2  --  5.0  CL 102 106  --  106  CO2 28 27  --  24  GLUCOSE 153* 138*  --  131*  BUN 49* 31* 23* 23*  CREATININE 1.61* 1.05* 0.8 0.84  CALCIUM 8.6* 8.3*  --  8.0*   Liver Function Tests:  Recent Labs  09/15/15 1638 09/16/15 0433  AST 27 23  ALT 19 17  ALKPHOS 54 52  BILITOT 2.4* 2.5*  PROT 6.4* 6.2*  ALBUMIN 3.2* 3.0*   No results for input(s): LIPASE, AMYLASE in the last 8760 hours. No results for input(s): AMMONIA in the last 8760 hours. CBC:  Recent Labs  02/27/15 1615 09/15/15 1638 09/16/15 0433 09/19/15 0523 09/20/15 09/20/15 0546  WBC 7.9 10.3 9.8 8.9 8.8 8.8  NEUTROABS 5.3 7.6   --   --   --   --   HGB 14.3 10.3* 10.0* 10.0*  --  9.2*  HCT 44.2 31.2* 31.8* 31.9*  --  28.3*  MCV 95.3 92.6 97.2 94.9  --  95.0  PLT 205 303 309 296  --  278   Cardiac Enzymes:  Recent Labs  09/22/15 1346 09/22/15 1840 09/23/15 0025  TROPONINI 0.03 <0.03 <0.03   BNP: Invalid input(s): POCBNP CBG:  Recent Labs  09/22/15 2208 09/23/15 0847 09/23/15 1114  GLUCAP 141* 114* 154*    Radiological Exams: Dg Cervical Spine 2-3 Views  09/19/2015  CLINICAL DATA:  T2-T3 thoracic laminectomy. EXAM: CERVICAL SPINE - 2-3 VIEW COMPARISON:  None. FINDINGS: Two intraoperative images were submitted. On image number 1, there is a surgical marker along the posterior aspect of the cervicothoracic junction. On the second image, there are surgical instruments in the region of T2-T3 IMPRESSION: Surgical marking in the region of T2-T3. Electronically Signed   By: Markus Daft M.D.   On: 09/19/2015 09:57   Dg Lumbar Spine 2-3 Views  09/15/2015  CLINICAL DATA:  Pre mri images; c/o abdominal spasms, numerous previous spinal surgeries EXAM: LUMBAR SPINE - 2-3 VIEW COMPARISON:  None. FINDINGS: Spinal rods throughout the lumbar spine. Bilateral hip replacements. Spinal rods extend from T12 through L5. Cross-table lateral extremely limited. There appears to be interbody fusion device at L4-5, L3-4, and L2-3. IMPRESSION: Extensive prior surgery. Electronically Signed   By: Skipper Cliche M.D.   On: 09/15/2015 20:47   Dg Pelvis 1-2 Views  09/10/2015  CLINICAL DATA:  Severe right-sided pelvic and thigh pain since last night. No known injury. EXAM: PELVIS - 1-2 VIEW COMPARISON:  10/28/2008 FINDINGS: There is no fracture or bone destruction or other acute abnormality. The bowel gas pattern is normal. Bilateral total hip  prostheses. Extensive lumbar fusion and posterior decompression. Dystrophic calcifications in the soft tissues adjacent to the right greater trochanter. IMPRESSION: No acute abnormalities.  Electronically Signed   By: Lorriane Shire M.D.   On: 09/10/2015 14:53   Mr Thoracic Spine Wo Contrast  09/17/2015  ADDENDUM REPORT: 09/17/2015 13:04 ADDENDUM: Critical Value/emergent results were called by telephone at the time of interpretation on 09/17/2015 at 1:00 pm toDr. Kerney Elbe, who verbally acknowledged these results. Electronically Signed   By: Richardean Sale M.D.   On: 09/17/2015 13:04  09/17/2015  CLINICAL DATA:  Bilateral lower extremity weakness with limited ability to walk over the last week. Evaluate for cord compression. EXAM: MRI THORACIC SPINE WITHOUT CONTRAST TECHNIQUE: Multiplanar, multisequence MR imaging of the thoracic spine was performed. No intravenous contrast was administered. COMPARISON:  Lumbar MRI 09/15/2015.  Lumbar myelogram CT 12/08/2005. FINDINGS: Alignment:  Normal. Bones: The sagittal localizing images through the cervical spine demonstrate previous C4-6 ACDF. Patient is also status post thoracolumbar fusion extending from T12 through L5. Within the thoracic spine, no evidence of acute fracture or worrisome osseous lesion. There are endplate degenerative changes anteriorly at T3-4. There is also prominent adjacent segment disease at T11-12 with T2 hyperintensity in the disc, but no endplate destruction. The thoracic spinal canal is relatively small on a congenital basis. Cord: The CSF surrounding the cord is effaced at multiple levels. There is mild cord flattening with possible associated T2 hyperintensity at the T3-4 and T5-6 levels, best seen on the sagittal images. The distal thoracic cord and conus medullaris appear unchanged. Paraspinal and other soft tissues: No significant paraspinal abnormalities. Hiatal hernia noted. Disc levels: T1-2: Disc bulging with posterior osteophytes. No cord compression or high-grade foraminal narrowing. T2-3: Disc bulging with posterior osteophytes contributing to partial effacement of the CSF surrounding the cord. There is no cord  deformity. The left foramen appears mildly narrowed. T3-4: Disc degeneration with broad-based central disc protrusion and osteophytes. There is facet and ligamentous hypertrophy. These factors contribute to the effacement of the CSF surrounding the cord and mild cord flattening. The AP diameter of the canal is approximately 5 mm. There is right-greater-than-left foraminal narrowing. T4-5: Anterior osteophytes. No significant spinal stenosis or nerve root encroachment. T5-6: Moderate size central disc extrusion is best seen on the sagittal images, causing cord compression and narrowing the AP diameter of the canal to 4 mm. The foramina appear sufficiently patent. From T6-7 through T10-11, there is mild disc degeneration with anterior osteophytes, but no cord compression or significant foraminal compromise. T11-12: Adjacent segment disease with T2 hyperintensity in the disc and prominent anterior osteophytes. Mild bilateral facet hypertrophy. No cord deformity or significant foraminal compromise. T12-L1: Solid interbody fusion. No spinal stenosis or nerve root encroachment. IMPRESSION: 1. Two levels of probably symptomatic cord compression at T3-4 and T5-6. Findings are primarily secondary to spondylosis at T3-4 and a central disc extrusion at T5-6. At both levels, there is mild cord flattening and possible abnormal cord signal which may reflect edema or myelomalacia. 2. Mild degenerative changes at the other levels as described without definite cord compression. 3. No acute osseous findings. 4. Previous cervical and thoracic fusion. 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 Electronically Signed: By: Richardean Sale M.D. On: 09/17/2015 12:52   Mr Lumbar Spine Wo Contrast  09/15/2015  CLINICAL DATA:  Initial evaluation for right foot drop. EXAM: MRI LUMBAR SPINE WITHOUT CONTRAST TECHNIQUE: Multiplanar, multisequence MR imaging of the lumbar spine was performed. No intravenous contrast was administered. COMPARISON:  Prior  radiograph from earlier the same day. FINDINGS: For the purposes of this dictation, lowest well-formed intervertebral disc spaces presumed to be the L5-S1 level, and there presumed to be 5 lumbar type vertebral bodies. Dextroscoliosis with apex at the L2-3 level. Vertebral bodies otherwise normally aligned with preservation of the normal lumbar lordosis. Vertebral body heights maintained. No evidence for fracture. Signal intensity within the vertebral body bone marrow within normal limits. Postoperative changes from fairly extensive remote thoracolumbar fusion and decompression present. Patient is fused from the T12 through L5 levels. Bilateral transpedicular screws in place at T12 L4 and L5. Right-sided transpedicular screws in place at L1 and L2, with a left-sided transpedicular screw in place at L3. Interbody fusions in place at L2-3, L3-4, and L4-5. Essentially complete ankylosis/ fusion at the T12-L1 and L1-2 levels as well. No complication about the hardware seen on this examination. Conus medullaris terminates normally at the T12-L1 level. Signal intensity within the visualized cord is normal. Nerve roots of the cauda equina within normal limits. No acute paraspinous soft tissue abnormality. Fatty atrophy noted within the paraspinous and psoas musculature, greater on the left. Partially visualized urinary bladder appears moderately distended. T10-11: Seen only on sagittal projection. Disc desiccation without significant disc bulge. Mild canal stenosis. No significant foraminal narrowing. T11-12: Disc desiccation with mild annular disc bulge. Endplate osteophytic spurring along the right anterior lateral aspect of the T11-12 disc space. Bilateral facet arthrosis. Resultant mild canal stenosis. Mild bilateral foraminal narrowing. T12-L1: Status post fusion. Bilateral facet arthrosis. Prominent osteophytic overgrowth along the right anterolateral and lateral aspect of the fused T12-L1 disc space. Mild right  lateral recess stenosis. Mild foraminal narrowing bilaterally. L1-2: Status post fusion and posterior decompression. Small central osseous ridge or possibly residual disc material indents the ventral thecal sac without significant canal stenosis. Foramina grossly patent. L2-3: Status post decompression with fusion. Central canal widely patent. No significant foraminal stenosis. L3-4: Status post decompression with fusion. Central canal widely patent. Probable mild left foraminal narrowing. Right neural foramen widely patent. L4-5: Status post wide decompression with fusion. Central canal widely patent. Bilateral facet arthrosis. Mild to moderate right foraminal narrowing related to facet disease. Left neural foramina widely patent. L5-S1: Diffuse degenerative intervertebral disc space narrowing with disc desiccation and disc bulge. No focal disc protrusion. Moderate to severe bilateral facet arthrosis. Fairly severe right foraminal narrowing related to disc bulge and osteophytic overgrowth at the hypertrophied right L5-S1 facet (series 3, image 6). More mild left foraminal stenosis. Mild to moderate canal narrowing. IMPRESSION: 1. Postoperative changes from remote thoracolumbar fusion and decompression at T12 through L5 as above. No significant canal or foraminal stenosis at these levels. 2. Multifactorial degenerative spondylolysis at L5-S1 with resultant severe right foraminal stenosis. This could potentially result in the patient's right lower extremity symptoms. 3. Dextroscoliosis with additional more mild multilevel degenerative changes as above. Please see above report for a full description of these findings. 4. Age-related fatty atrophy within the paraspinous and psoas musculature. Electronically Signed   By: Jeannine Boga M.D.   On: 09/15/2015 22:04   Dg Chest Port 1 View  09/17/2015  CLINICAL DATA:  Leg weakness EXAM: PORTABLE CHEST 1 VIEW COMPARISON:  10/29/2008 FINDINGS: Normal heart size.  Lungs clear. No pneumothorax. No pleural effusion. Severe chronic changes at both shoulder joints with chronic rotator cuff tearing. IMPRESSION: No active disease. Electronically Signed   By: Marybelle Killings M.D.   On: 09/17/2015 15:54   Dg Knee Complete 4 Views  Right  09/10/2015  CLINICAL DATA:  82 year old female with a history of right knee pain. EXAM: RIGHT KNEE - COMPLETE 4+ VIEW COMPARISON:  None. FINDINGS: Surgical changes of prior right knee arthroplasty. No fracture identified. No joint effusion or significant soft tissue swelling. Vascular calcifications of the femoral popliteal system. IMPRESSION: Surgical changes of right knee arthroplasty, with no fracture identified. Vascular calcifications. Signed, Dulcy Fanny. Earleen Newport, DO [facility] Electronically Signed   By: Corrie Mckusick D.O.   On: 09/10/2015 10:58   Dg Femur, Min 2 Views Right  09/10/2015  CLINICAL DATA:  Right-sided femur pain, no known injury, initial encounter EXAM: RIGHT FEMUR 2 VIEWS COMPARISON:  None. FINDINGS: Right hip and knee prostheses are seen. No acute fracture or loosening is noted. No soft tissue abnormality is seen. IMPRESSION: Postsurgical changes without acute abnormality. Electronically Signed   By: Inez Catalina M.D.   On: 09/10/2015 14:50    Assessment/Plan  Lower extremity weakness With thoracic stenosis with myelopathy. Will have patient work with PT/OT as tolerated to regain strength and restore function.  Fall precautions are in place.  thoracic stenosis with myelopathy S/p laminectomy and decompression. Will have her work with physical therapy and occupational therapy team to help with gait training and muscle strengthening exercises.fall precautions. Skin care. Encourage to be out of bed. Continue tylenol 650 mg q4h prn pain, percocet 5-325 mg 1-2 tab q4h prn pain and flexeril 5 mg bid prn muscle spasm. Has follow up with neurosurgery. Get physiatry  consult  Blood loss anemia Post op, monitor cbc  HTN Monitor bp, continue bisoprolol- hctz 10-6.25 mg bid, losartan 100 mg daily. check bmp  Protein calorie malnutrition Continue feeding supplement and MVI  Itching To her back area. Give benadry 25 mg q8h prn itching.  Pressure ulcer Has preventative dressing to her sacrum area. With her limited mobility, get gel overlay mattress to help prevent pressure ulcer  Constipation Continue senokot bid and add miralax daily for now. Hydration encouraged  afib Rate controlled. Continue bisoprolol and coumadin. inr checked today. Change coumadin to 3 mg daily, check inr 3/08/27/15  gerd Stable, continue protonix and zantac  UTI Completed antibiotics, monitor clinically, hydration encouraged  HLD Continue her home regimen statin   Goals of care: short term rehabilitation   Labs/tests ordered: cbc, cmp  Family/ staff Communication: reviewed care plan with patient and nursing supervisor    Blanchie Serve, MD Internal Medicine [place], [place] [address] Cell Phone (Monday-Friday 8 am - 5 pm): [phone] On Call: [phone] and follow prompts after 5 pm and on weekends Office Phone: [phone] Office Fax: [phone]

## 2015-09-25 ENCOUNTER — Other Ambulatory Visit: Payer: Self-pay | Admitting: *Deleted

## 2015-09-25 DIAGNOSIS — G9529 Other cord compression: Secondary | ICD-10-CM | POA: Diagnosis not present

## 2015-09-25 DIAGNOSIS — M4326 Fusion of spine, lumbar region: Secondary | ICD-10-CM | POA: Diagnosis not present

## 2015-09-25 DIAGNOSIS — M6281 Muscle weakness (generalized): Secondary | ICD-10-CM | POA: Diagnosis not present

## 2015-09-25 DIAGNOSIS — R262 Difficulty in walking, not elsewhere classified: Secondary | ICD-10-CM | POA: Diagnosis not present

## 2015-09-25 DIAGNOSIS — M1712 Unilateral primary osteoarthritis, left knee: Secondary | ICD-10-CM

## 2015-09-25 DIAGNOSIS — R600 Localized edema: Secondary | ICD-10-CM | POA: Diagnosis not present

## 2015-09-25 NOTE — Patient Outreach (Signed)
[facility]) Care Management  09/25/2015  [name] [name] Nov 06, 1933 [id number]   Assessment: Care coordination Patient noted to be discharged to [facility] facility on 3/29 for short term rehabilitation. Referral to [place] worker done to follow-up patient while at skilled nursing facility and to notify care management coordinator of patient's discharge from [facility] in order to follow- up appropriately.   Plan: Will await for [facility] social worker's advise of patient's SNF (Skilled Nursing facility) discharge. Will follow-up patient after discharge from [facility].   Tamula Morrical A. Stefania Goulart, BSN, RN-BC [place] Management Coordinator Cell: [phone]

## 2015-09-26 ENCOUNTER — Encounter: Payer: Self-pay | Admitting: Adult Health

## 2015-09-26 ENCOUNTER — Non-Acute Institutional Stay (SKILLED_NURSING_FACILITY): Payer: Medicare Other | Admitting: Adult Health

## 2015-09-26 ENCOUNTER — Other Ambulatory Visit: Payer: Self-pay | Admitting: Licensed Clinical Social Worker

## 2015-09-26 DIAGNOSIS — I482 Chronic atrial fibrillation, unspecified: Secondary | ICD-10-CM

## 2015-09-26 DIAGNOSIS — Z7901 Long term (current) use of anticoagulants: Secondary | ICD-10-CM | POA: Diagnosis not present

## 2015-09-26 DIAGNOSIS — G9529 Other cord compression: Secondary | ICD-10-CM | POA: Diagnosis not present

## 2015-09-26 DIAGNOSIS — M4326 Fusion of spine, lumbar region: Secondary | ICD-10-CM | POA: Diagnosis not present

## 2015-09-26 DIAGNOSIS — R262 Difficulty in walking, not elsewhere classified: Secondary | ICD-10-CM | POA: Diagnosis not present

## 2015-09-26 DIAGNOSIS — M6281 Muscle weakness (generalized): Secondary | ICD-10-CM | POA: Diagnosis not present

## 2015-09-26 DIAGNOSIS — R600 Localized edema: Secondary | ICD-10-CM | POA: Diagnosis not present

## 2015-09-26 NOTE — Progress Notes (Signed)
Patient ID: [name] [name], female   DOB: 01-Nov-1933, 82 y.o.   MRN: [id number] Subjective:     Indication: atrial fibrillation Bleeding signs/symptoms: None Thromboembolic signs/symptoms: None  Missed Coumadin doses: None Medication changes: no Dietary changes: no Bacterial/viral infection: no Other concerns: no  The following portions of the patient's history were reviewed and updated as appropriate: allergies, current medications, past family history, past medical history, past social history, past surgical history and problem list.  Review of Systems A comprehensive review of systems was negative.   Objective:    INR Today: 1.2 Current dose: Coumadin 3 mg daily     Assessment:    Subtherapeutic INR for goal of 2-3   Plan:    1. New dose: increase Coumadin to 4 mg PO daily   2. Next INR: 09/30/15

## 2015-09-26 NOTE — Patient Outreach (Signed)
[name]) Care Management  09/26/2015  [name] [name] January 17, 1934 [id number]   Assessment-This CSW is covering for [name] [name]. Referral received. CSW arrived at [facility] to complete assessment. CSW found patient's room but door was closed. CSW knocked several times and did not hear an answer. CSW asked a nurse if patient was in room. Nurse opened door and patient was not in her room. Nurse states that she thinks patient is at therapy at this time.  Plan-CSW will update [name] [name] upon her return that initial outreach SNF visit was unsuccessful.  [name] [name], BSW, MSW, [name] Phone: [phone] Fax: [phone]

## 2015-09-29 DIAGNOSIS — R262 Difficulty in walking, not elsewhere classified: Secondary | ICD-10-CM | POA: Diagnosis not present

## 2015-09-29 DIAGNOSIS — M4326 Fusion of spine, lumbar region: Secondary | ICD-10-CM | POA: Diagnosis not present

## 2015-09-29 DIAGNOSIS — R600 Localized edema: Secondary | ICD-10-CM | POA: Diagnosis not present

## 2015-09-29 DIAGNOSIS — G9529 Other cord compression: Secondary | ICD-10-CM | POA: Diagnosis not present

## 2015-09-29 DIAGNOSIS — M6281 Muscle weakness (generalized): Secondary | ICD-10-CM | POA: Diagnosis not present

## 2015-09-29 LAB — CBC AND DIFFERENTIAL
HCT: 31 % — AB (ref 36–46)
Hemoglobin: 9.3 g/dL — AB (ref 12.0–16.0)
NEUTROS ABS: 5 /uL
Platelets: 423 10*3/uL — AB (ref 150–399)
WBC: 8.3 10*3/mL

## 2015-09-29 LAB — BASIC METABOLIC PANEL
BUN: 16 mg/dL (ref 4–21)
Creatinine: 0.8 mg/dL (ref 0.5–1.1)
GLUCOSE: 111 mg/dL
Potassium: 4.5 mmol/L (ref 3.4–5.3)
Sodium: 141 mmol/L (ref 137–147)

## 2015-09-29 LAB — HEPATIC FUNCTION PANEL
ALT: 13 U/L (ref 7–35)
AST: 17 U/L (ref 13–35)
Alkaline Phosphatase: 90 U/L (ref 25–125)
BILIRUBIN, TOTAL: 0.8 mg/dL

## 2015-09-30 ENCOUNTER — Encounter: Payer: Self-pay | Admitting: Adult Health

## 2015-09-30 ENCOUNTER — Non-Acute Institutional Stay (SKILLED_NURSING_FACILITY): Payer: Medicare Other | Admitting: Adult Health

## 2015-09-30 DIAGNOSIS — G9529 Other cord compression: Secondary | ICD-10-CM | POA: Diagnosis not present

## 2015-09-30 DIAGNOSIS — M6281 Muscle weakness (generalized): Secondary | ICD-10-CM | POA: Diagnosis not present

## 2015-09-30 DIAGNOSIS — I482 Chronic atrial fibrillation, unspecified: Secondary | ICD-10-CM

## 2015-09-30 DIAGNOSIS — Z7901 Long term (current) use of anticoagulants: Secondary | ICD-10-CM | POA: Diagnosis not present

## 2015-09-30 DIAGNOSIS — R600 Localized edema: Secondary | ICD-10-CM | POA: Diagnosis not present

## 2015-09-30 DIAGNOSIS — M4326 Fusion of spine, lumbar region: Secondary | ICD-10-CM | POA: Diagnosis not present

## 2015-09-30 DIAGNOSIS — E46 Unspecified protein-calorie malnutrition: Secondary | ICD-10-CM

## 2015-09-30 DIAGNOSIS — R262 Difficulty in walking, not elsewhere classified: Secondary | ICD-10-CM | POA: Diagnosis not present

## 2015-09-30 NOTE — Progress Notes (Signed)
Patient ID: [name] [name], female   DOB: 01-Dec-1933, 82 y.o.   MRN: [id number]    DATE:   09/30/15  MRN:  [id number]  BIRTHDAY: 1934-06-05  Facility:  Nursing Home Location:  [place] Room Number: 1107-P  LEVEL OF CARE:  SNF (31)  Contact Information    Name Relation Home Work Mobile   Ybanez,John Son [phone]     Mcguirk,Gay Relative   [phone]       Code Status History    Date Active Date Inactive Code Status Order ID Comments User Context   09/19/2015  7:32 AM 09/23/2015  5:44 PM DNR [id number]  Kevan Ny Ditty, MD Inpatient   09/15/2015  4:14 PM 09/19/2015  7:32 AM Full Code [id number]  Ardeen Jourdain, PA-C Inpatient   05/02/2012 10:48 PM 05/05/2012  8:35 PM Full Code [id number]  Blinda Leatherwood, RN Inpatient    Questions for Most Recent Historical Code Status (Order [id number])    Question Answer Comment   In the event of cardiac or respiratory ARREST Do not call a "code blue"    In the event of cardiac or respiratory ARREST Do not perform Intubation, CPR, defibrillation or ACLS    In the event of cardiac or respiratory ARREST Use medication by any route, position, wound care, and other measures to relive pain and suffering. May use oxygen, suction and manual treatment of airway obstruction as needed for comfort.     Advance Directive Documentation        Most Recent Value   Type of Advance Directive  Out of facility DNR (pink MOST or yellow form)   Pre-existing out of facility DNR order (yellow form or pink MOST form)     "MOST" Form in Place?         Chief Complaint  Patient presents with  . Acute Visit    Coumadin therapy, protein-calorie malnutrition    HISTORY OF PRESENT ILLNESS:  This is a 82 year old female who has INR 1.3, subtherapeutic, with goal INR 2-3. She is on chronic Coumadin therapy due to atrial fibrillation. It was noted, upon review of her labs, that her albumin is 2.65.  She has been admitted to [facility] on  09/23/15 from [facility] She has PMH of atrial fibrillation, hypertension and dyslipidemia. She was hospitalized due to bilateral lower extremity weakness. 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 She had thoracic laminectomies for decompression T2-T3, T3-T4, T5-T6 on 09/19/15 by neurosurgery.  She has been admitted to [facility] for short-term rehabilitation.  PAST MEDICAL HISTORY:  Past Medical History  Diagnosis Date  . Chronic atrial fibrillation ([place])   . Hypertension   . Hyperlipidemia   . Osteoarthritis   . Diabetes ([place])     prediabetes  . Weakness of both lower extremities   . Acute blood loss anemia   . Protein calorie malnutrition ([place])   . Spinal stenosis, thoracic   . UTI (lower urinary tract infection)   . Constipation   . Pressure ulcer      CURRENT MEDICATIONS: Reviewed  Patient's Medications  New Prescriptions   No medications on file  Previous Medications   ACETAMINOPHEN (TYLENOL) 325 MG TABLET    Take 2 tablets (650 mg total) by mouth every 4 (four) hours as needed (temp > 100.5).   BISOPROLOL-HYDROCHLOROTHIAZIDE (ZIAC) 10-6.25 MG PER TABLET    Take 1 tablet by mouth 2 (two) times daily.  CALCIUM CARBONATE (TUMS EX) 750 MG CHEWABLE TABLET    Chew 2 tablets by mouth daily as needed for heartburn.   CYCLOBENZAPRINE (FLEXERIL) 5 MG TABLET    Take 5 mg by mouth 2 (two) times daily as needed for muscle spasms.   DIPHENHYDRAMINE (BENADRYL) 25 MG TABLET    Take 25 mg by mouth at bedtime as needed.   ERGOCALCIFEROL (VITAMIN D2) 2000 UNITS TABS    Take 1 tablet by mouth daily.       HYDROCORTISONE CREAM 1 %    Apply 1 application topically daily as needed for itching.   LOPERAMIDE (IMODIUM A-D) 2 MG TABLET    Take 4 mg by mouth 4 (four) times daily as needed for diarrhea or loose stools.   LOSARTAN (COZAAR) 100 MG TABLET    Take 100 mg by mouth daily.   MULTIPLE VITAMIN (MULTIVITAMIN WITH MINERALS) TABS TABLET    Take  1 tablet by mouth daily.   OXYBUTYNIN (DITROPAN) 5 MG TABLET    Take 5 mg by mouth 2 (two) times daily.    OXYCODONE-ACETAMINOPHEN (PERCOCET/ROXICET) 5-325 MG TABLET    Take 1-2 tablets by mouth every 4 (four) hours as needed for moderate pain.   POLYETHYLENE GLYCOL (MIRALAX / GLYCOLAX) PACKET    Take 17 g by mouth daily.   PROTEIN (PROCEL) POWD    Take 2 scoop by mouth 2 (two) times daily.    RANITIDINE (ZANTAC) 75 MG TABLET    Take 75 mg by mouth daily as needed for heartburn.   ROSUVASTATIN (CRESTOR) 10 MG TABLET    Take 10 mg by mouth. M-W-F at bedtime   SENNA (SENOKOT) 8.6 MG TABS TABLET    Take 1 tablet (8.6 mg total) by mouth 2 (two) times daily.   WARFARIN SODIUM (COUMADIN PO)    Take 7.5 mg by mouth X 1 then 5 mg daily. Re-check INR on 10/03/15   PANTOPRAZOLE (PROTONIX) 40 MG TABLET               Take 1 tablet (40 mg total) by mouth daily.    UNABLE TO FIND                                                                                                                                                  Med Name: MedPass 120 mL twice daily between meals                            Allergies  Allergen Reactions  . Nsaids     On coumadin  . Statins     Cause leg cramps     REVIEW OF SYSTEMS:  GENERAL: no change in appetite, no fatigue, no weight changes, no fever, chills or weakness EYES: Denies change in vision, dry eyes, eye  pain, itching or discharge EARS: Denies change in hearing, ringing in ears, or earache NOSE: Denies nasal congestion or epistaxis MOUTH and THROAT: Denies oral discomfort, gingival pain or bleeding, pain from teeth or hoarseness   RESPIRATORY: no cough, SOB, DOE, wheezing, hemoptysis CARDIAC: no chest pain, or palpitations GI: no abdominal pain, diarrhea, constipation, heart burn, nausea or vomiting GU: Denies dysuria, frequency, hematuria, incontinence, or discharge PSYCHIATRIC: Denies feeling of depression or anxiety. No report of hallucinations,  insomnia, paranoia, or agitation    PHYSICAL EXAMINATION  GENERAL APPEARANCE: Well nourished. In no acute distress. Normal body habitus SKIN:  Midline upper back surgical incision is dry, no erythema HEAD: Normal in size and contour. No evidence of trauma EYES: Lids open and close normally. No blepharitis, entropion or ectropion. PERRL. Conjunctivae are clear and sclerae are white. Lenses are without opacity EARS: Pinnae are normal. Patient hears normal voice tunes of the examiner MOUTH and THROAT: Lips are without lesions. Oral mucosa is moist and without lesions. Tongue is normal in shape, size, and color and without lesions NECK: supple, trachea midline, no neck masses, no thyroid tenderness, no thyromegaly LYMPHATICS: no LAN in the neck, no supraclavicular LAN RESPIRATORY: breathing is even & unlabored, BS CTAB CARDIAC: Irregularly irregular, no murmur,no extra heart sounds GI: abdomen soft, normal BS, no masses, no tenderness, no hepatomegaly, no splenomegaly EXTREMITIES:  Able to move X 4 extremities PSYCHIATRIC: Alert and oriented X 3. Affect and behavior are appropriate  LABS/RADIOLOGY: Labs reviewed: Basic Metabolic Panel:  Recent Labs  09/16/15 0433 09/19/15 0523 09/20/15 09/20/15 0546 09/29/15  NA 140 140 138 138 141  K 4.8 4.2  --  5.0 4.5  CL 102 106  --  106  --   CO2 28 27  --  24  --   GLUCOSE 153* 138*  --  131*  --   BUN 49* 31* 23* 23* 16  CREATININE 1.61* 1.05* 0.8 0.84 0.8  CALCIUM 8.6* 8.3*  --  8.0*  --    Liver Function Tests:  Recent Labs  09/15/15 1638 09/16/15 0433 09/29/15  AST 27 23 17   ALT 19 17 13   ALKPHOS 54 52 90  BILITOT 2.4* 2.5*  --   PROT 6.4* 6.2*  --   ALBUMIN 3.2* 3.0*  --     CBC:  Recent Labs  02/27/15 1615 09/15/15 1638 09/16/15 0433 09/19/15 0523 09/20/15 09/20/15 0546 09/29/15  WBC 7.9 10.3 9.8 8.9 8.8 8.8 8.3  NEUTROABS 5.3 7.6  --   --   --   --  5  HGB 14.3 10.3* 10.0* 10.0*  --  9.2* 9.3*  HCT 44.2 31.2*  31.8* 31.9*  --  28.3* 31*  MCV 95.3 92.6 97.2 94.9  --  95.0  --   PLT 205 303 309 296  --  278 423*   Cardiac Enzymes:  Recent Labs  09/22/15 1346 09/22/15 1840 09/23/15 0025  TROPONINI 0.03 <0.03 <0.03   CBG:  Recent Labs  09/22/15 2208 09/23/15 0847 09/23/15 1114  GLUCAP 141* 114* 154*      ASSESSMENT/PLAN:  Protein calorie malnutrition, severe - albumin 2.65; start Procel 2 scoops by mouth twice a day and RD consult  Long-term use of anticoagulant - INR 1.3, subtherapeutic; give Coumadin 7.5 mg by mouth 1 today then Coumadin 5 mg by mouth daily, INR check on 10/03/15  Chronic atrial fibrillation - rate controlled; continue Coumadin and [place], NP [employer] [phone]

## 2015-10-01 DIAGNOSIS — M4326 Fusion of spine, lumbar region: Secondary | ICD-10-CM | POA: Diagnosis not present

## 2015-10-01 DIAGNOSIS — M6281 Muscle weakness (generalized): Secondary | ICD-10-CM | POA: Diagnosis not present

## 2015-10-01 DIAGNOSIS — G9529 Other cord compression: Secondary | ICD-10-CM | POA: Diagnosis not present

## 2015-10-01 DIAGNOSIS — R600 Localized edema: Secondary | ICD-10-CM | POA: Diagnosis not present

## 2015-10-01 DIAGNOSIS — R262 Difficulty in walking, not elsewhere classified: Secondary | ICD-10-CM | POA: Diagnosis not present

## 2015-10-02 DIAGNOSIS — R262 Difficulty in walking, not elsewhere classified: Secondary | ICD-10-CM | POA: Diagnosis not present

## 2015-10-02 DIAGNOSIS — R600 Localized edema: Secondary | ICD-10-CM | POA: Diagnosis not present

## 2015-10-02 DIAGNOSIS — M4326 Fusion of spine, lumbar region: Secondary | ICD-10-CM | POA: Diagnosis not present

## 2015-10-02 DIAGNOSIS — M6281 Muscle weakness (generalized): Secondary | ICD-10-CM | POA: Diagnosis not present

## 2015-10-02 DIAGNOSIS — G9529 Other cord compression: Secondary | ICD-10-CM | POA: Diagnosis not present

## 2015-10-09 ENCOUNTER — Other Ambulatory Visit: Payer: Self-pay | Admitting: *Deleted

## 2015-10-09 ENCOUNTER — Encounter: Payer: Self-pay | Admitting: *Deleted

## 2015-10-09 NOTE — Patient Outreach (Signed)
[name]) Care [name]  10/09/2015  [name] [name] 1933-06-30 [id number]   CSW was able to make initial contact with patient today to perform the assessment, as well as assess and assist with social work needs and services.  CSW met with patient at [name] where patient currently resides to receive short-term rehabilitative services.  CSW introduced self, explained role and types of services provided through [name] ([name]).  CSW further explained to patient that CSW works with patient's RNCM, also with [name]. CSW then explained the reason for the call, indicating that [name] [name] thought that patient would benefit from social work services and resources to assist with .  CSW obtained two HIPAA compliant identifiers from patient, which included patient's name and date of birth. Patient was extremely pleasant and appreciative of CSW's visit.  Patient was also appreciative of CSW's willingness to assist with her discharge planning needs.  CSW agreed to work with the [name] [name] at [name] to ensure that patient has all the home care services and durable medical equipment she needs, prior to returning home to live independently.  Patient admits to working well with therapies (both physical and occupational), wanting CSW to communicate her progress to her Primary Care Physician, [name] [name].  CSW agreed to meet with patient again in a few weeks, but provided patient with CSW's contact information, in the event that patient needs social work assistance in the meantime. [name] [name], BSW, MSW, LCSW  Licensed Education officer, [facility] System  Mailing [place] [address] [place], [place] [address] Physical Address-[address], [place], [place] [address] Toll Free Main # [phone] Fax #  [phone] Cell # [phone]  Fax # [phone]  [name]  Patient's preferred language:  [place]   English  ATTENTION:  If you speak [place], language assistance services, free of charge, are available to you.    Nondiscrimination and Accessibility Statement: Discrimination is Against the [employer], a subsidiary of [employer], complies with [employer] civil rights laws and does not discriminate on the basis of race, color, national origin, age, disability, or sex.  [place] does not exclude people or treat them differently because of race, color, national origin, age, disability, or sex.  [place] Providers will:  . Provide free aids and services to people with disabilities to communicate effectively with [place], such as:     ? Qualified sign language interpreters  ? Written information in other formats (large print, audio, accessible electronic formats, other formats)   . Provide free language services to people whose primary language is not [place], such as:    ? Qualified interpreters    ? Information written in other languages   If you need these services, contact your [facility].  If you believe that a [facility] [employer] has failed to provide these services or discriminated in another way on the basis of race, color, national origin, age, disability, or sex, you can file a Tourist information centre [name] with: [place], [phone] or http://chapman.info/.  You can file a grievance in person or by mail, fax, or email. If you need help filing a grievance, you may contact Valrie Hart, Interim Compliance Officer, [name] Department of Compliance and Integrity, [address], [place], [place]. [place], [phone], [email or web address].  You can also file a civil rights complaint  with the U.S. Department of Health and Financial controller, Office for [employer], electronically through the Office for Civil Rights Complaint Portal, available at OnSiteLending.nl.jsf, or by mail or phone at:  [place]. Department of Health and Human Services [address], [place] Room [phone], [name] Building [place], [place]  [phone], [phone] (TDD) Complaint forms are available at CutFunds.[email or web address].

## 2015-10-13 ENCOUNTER — Encounter: Payer: Self-pay | Admitting: Adult Health

## 2015-10-13 ENCOUNTER — Non-Acute Institutional Stay (SKILLED_NURSING_FACILITY): Payer: Medicare Other | Admitting: Adult Health

## 2015-10-13 DIAGNOSIS — Z7901 Long term (current) use of anticoagulants: Secondary | ICD-10-CM | POA: Diagnosis not present

## 2015-10-13 DIAGNOSIS — R600 Localized edema: Secondary | ICD-10-CM

## 2015-10-13 DIAGNOSIS — I482 Chronic atrial fibrillation, unspecified: Secondary | ICD-10-CM

## 2015-10-13 DIAGNOSIS — R262 Difficulty in walking, not elsewhere classified: Secondary | ICD-10-CM | POA: Diagnosis not present

## 2015-10-13 DIAGNOSIS — G9529 Other cord compression: Secondary | ICD-10-CM | POA: Diagnosis not present

## 2015-10-13 DIAGNOSIS — M6281 Muscle weakness (generalized): Secondary | ICD-10-CM | POA: Diagnosis not present

## 2015-10-13 DIAGNOSIS — M4326 Fusion of spine, lumbar region: Secondary | ICD-10-CM | POA: Diagnosis not present

## 2015-10-13 NOTE — Progress Notes (Addendum)
Patient ID: [name], female   DOB: Jun 23, 1934, 82 y.o.   MRN: [id number]    DATE:   10/13/15  MRN:  [id number]  BIRTHDAY: 08-Sep-1933  Facility:  Nursing Home Location:  [place] Room Number: 1107-P  LEVEL OF CARE:  SNF (31)  Contact Information    Name Relation Home Work Mobile   Theys,John Son [phone]     Bartolo,Gay Relative   [phone]       Code Status History    Date Active Date Inactive Code Status Order ID Comments User Context   09/19/2015  7:32 AM 09/23/2015  5:44 PM DNR [id number]  Kevan Ny Ditty, MD Inpatient   09/15/2015  4:14 PM 09/19/2015  7:32 AM Full Code [id number]  Ardeen Jourdain, PA-C Inpatient   05/02/2012 10:48 PM 05/05/2012  8:35 PM Full Code [id number]  Blinda Leatherwood, RN Inpatient    Questions for Most Recent Historical Code Status (Order [id number])    Question Answer Comment   In the event of cardiac or respiratory ARREST Do not call a "code blue"    In the event of cardiac or respiratory ARREST Do not perform Intubation, CPR, defibrillation or ACLS    In the event of cardiac or respiratory ARREST Use medication by any route, position, wound care, and other measures to relive pain and suffering. May use oxygen, suction and manual treatment of airway obstruction as needed for comfort.     Advance Directive Documentation        Most Recent Value   Type of Advance Directive  Out of facility DNR (pink MOST or yellow form)   Pre-existing out of facility DNR order (yellow form or pink MOST form)     "MOST" Form in Place?         Chief Complaint  Patient presents with  . Acute Visit    BLE edema    HISTORY OF PRESENT ILLNESS:  This is a 82 year old female who was noted to have BLE edema 3+. NOted ted hose was tightly fitting on both lower extremity. Venous ultrasound done is negative for BLE DVT. She is currently on chronic Coumadin therapy for atrial fibrillation. INR is 3.0, therapeutic.  She has been  admitted to [facility] on 09/23/15 from [facility]. She has PMH of atrial fibrillation, hypertension and dyslipidemia. She was hospitalized due to bilateral lower extremity weakness. 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 She had thoracic laminectomies for decompression T2-T3, T3-T4, T5-T6 on 09/19/15 by neurosurgery.  She has been admitted to [facility] for short-term rehabilitation.  PAST MEDICAL HISTORY:  Past Medical History  Diagnosis Date  . Chronic atrial fibrillation ([place])   . Hypertension   . Hyperlipidemia   . Osteoarthritis   . Diabetes ([place])     prediabetes  . Weakness of both lower extremities   . Acute blood loss anemia   . Protein calorie malnutrition ([place])   . Spinal stenosis, thoracic   . UTI (lower urinary tract infection)   . Constipation   . Pressure ulcer      CURRENT MEDICATIONS: Reviewed  Patient's Medications  New Prescriptions   No medications on file  Previous Medications   ACETAMINOPHEN (TYLENOL) 325 MG TABLET    Take 2 tablets (650 mg total) by mouth every 4 (four) hours as needed (temp > 100.5).   BISOPROLOL-HYDROCHLOROTHIAZIDE (ZIAC) 10-6.25 MG PER TABLET    Take 1 tablet  by mouth 2 (two) times daily.    CALCIUM CARBONATE (TUMS EX) 750 MG CHEWABLE TABLET    Chew 2 tablets by mouth daily as needed for heartburn.   CYCLOBENZAPRINE (FLEXERIL) 5 MG TABLET    Take 5 mg by mouth 2 (two) times daily as needed for muscle spasms.   DIPHENHYDRAMINE (BENADRYL) 25 MG TABLET    Take 25 mg by mouth at bedtime as needed.   ERGOCALCIFEROL (VITAMIN D2) 2000 UNITS TABS    Take 1 tablet by mouth daily.       HYDROCORTISONE CREAM 1 %    Apply 1 application topically daily as needed for itching.   LOPERAMIDE (IMODIUM A-D) 2 MG TABLET    Take 4 mg by mouth 4 (four) times daily as needed for diarrhea or loose stools.   LOSARTAN (COZAAR) 100 MG TABLET    Take 100 mg by mouth daily.   MULTIPLE VITAMIN (MULTIVITAMIN WITH  MINERALS) TABS TABLET    Take 1 tablet by mouth daily.   OXYBUTYNIN (DITROPAN) 5 MG TABLET    Take 5 mg by mouth 2 (two) times daily.    OXYCODONE-ACETAMINOPHEN (PERCOCET/ROXICET) 5-325 MG TABLET    Take 1-2 tablets by mouth every 4 (four) hours as needed for moderate pain.   POLYETHYLENE GLYCOL (MIRALAX / GLYCOLAX) PACKET    Take 17 g by mouth daily.   PROTEIN (PROCEL) POWD    Take 2 scoop by mouth 2 (two) times daily.    RANITIDINE (ZANTAC) 75 MG TABLET    Take 75 mg by mouth daily as needed for heartburn.   ROSUVASTATIN (CRESTOR) 10 MG TABLET    Take 10 mg by mouth. M-W-F at bedtime   SENNA (SENOKOT) 8.6 MG TABS TABLET    Take 1 tablet (8.6 mg total) by mouth 2 (two) times daily.   WARFARIN SODIUM (COUMADIN PO)    Take 4 mg by mouth daily. Re-check INR on 10/14/15   PANTOPRAZOLE (PROTONIX) 40 MG TABLET               Take 1 tablet (40 mg total) by mouth daily.    UNABLE TO FIND                                                                                                                                                  Med Name: MedPass 120 mL twice daily between meals                            Allergies  Allergen Reactions  . Nsaids     On coumadin  . Statins     Cause leg cramps     REVIEW OF SYSTEMS:  GENERAL: no change in appetite, no fatigue, no weight changes, no fever, chills or weakness EYES: Denies change in  vision, dry eyes, eye pain, itching or discharge EARS: Denies change in hearing, ringing in ears, or earache NOSE: Denies nasal congestion or epistaxis MOUTH and THROAT: Denies oral discomfort, gingival pain or bleeding, pain from teeth or hoarseness   RESPIRATORY: no cough, SOB, DOE, wheezing, hemoptysis CARDIAC: no chest pain, or palpitations, +edema GI: no abdominal pain, diarrhea, constipation, heart burn, nausea or vomiting GU: Denies dysuria, frequency, hematuria, incontinence, or discharge PSYCHIATRIC: Denies feeling of depression or anxiety. No report of  hallucinations, insomnia, paranoia, or agitation    PHYSICAL EXAMINATION  GENERAL APPEARANCE: Well nourished. In no acute distress. Normal body habitus SKIN:  Midline upper back surgical incision is dry, no erythema HEAD: Normal in size and contour. No evidence of trauma EYES: Lids open and close normally. No blepharitis, entropion or ectropion. PERRL. Conjunctivae are clear and sclerae are white. Lenses are without opacity EARS: Pinnae are normal. Patient hears normal voice tunes of the examiner MOUTH and THROAT: Lips are without lesions. Oral mucosa is moist and without lesions. Tongue is normal in shape, size, and color and without lesions NECK: supple, trachea midline, no neck masses, no thyroid tenderness, no thyromegaly LYMPHATICS: no LAN in the neck, no supraclavicular LAN RESPIRATORY: breathing is even & unlabored, BS CTAB CARDIAC: Irregularly irregular, no murmur,no extra heart sounds, BLE edema 3+ GI: abdomen soft, normal BS, no masses, no tenderness, no hepatomegaly, no splenomegaly EXTREMITIES:  Able to move X 4 extremities PSYCHIATRIC: Alert and oriented X 3. Affect and behavior are appropriate  LABS/RADIOLOGY: Labs reviewed: Basic Metabolic Panel:  Recent Labs  09/16/15 0433 09/19/15 0523 09/20/15 09/20/15 0546 09/29/15  NA 140 140 138 138 141  K 4.8 4.2  --  5.0 4.5  CL 102 106  --  106  --   CO2 28 27  --  24  --   GLUCOSE 153* 138*  --  131*  --   BUN 49* 31* 23* 23* 16  CREATININE 1.61* 1.05* 0.8 0.84 0.8  CALCIUM 8.6* 8.3*  --  8.0*  --    Liver Function Tests:  Recent Labs  09/15/15 1638 09/16/15 0433 09/29/15  AST 27 23 17   ALT 19 17 13   ALKPHOS 54 52 90  BILITOT 2.4* 2.5*  --   PROT 6.4* 6.2*  --   ALBUMIN 3.2* 3.0*  --     CBC:  Recent Labs  02/27/15 1615 09/15/15 1638 09/16/15 0433 09/19/15 0523 09/20/15 09/20/15 0546 09/29/15  WBC 7.9 10.3 9.8 8.9 8.8 8.8 8.3  NEUTROABS 5.3 7.6  --   --   --   --  5  HGB 14.3 10.3* 10.0* 10.0*   --  9.2* 9.3*  HCT 44.2 31.2* 31.8* 31.9*  --  28.3* 31*  MCV 95.3 92.6 97.2 94.9  --  95.0  --   PLT 205 303 309 296  --  278 423*   Cardiac Enzymes:  Recent Labs  09/22/15 1346 09/22/15 1840 09/23/15 0025  TROPONINI 0.03 <0.03 <0.03   CBG:  Recent Labs  09/22/15 2208 09/23/15 0847 09/23/15 1114  GLUCAP 141* 114* 154*      ASSESSMENT/PLAN:  BLE Edema - start Lasix 40 mg daily, BMP in 1 week and discontinue TED hose  Long-term use of anticoagulant - INR 3.0, therapeutic; start Coumadin 4 mg by mouth daily, INR check on 10/14/15  Chronic atrial fibrillation - rate controlled; continue Coumadin and [place], NP [employer] [phone]

## 2015-10-15 DIAGNOSIS — R262 Difficulty in walking, not elsewhere classified: Secondary | ICD-10-CM | POA: Diagnosis not present

## 2015-10-15 DIAGNOSIS — G9529 Other cord compression: Secondary | ICD-10-CM | POA: Diagnosis not present

## 2015-10-15 DIAGNOSIS — M4326 Fusion of spine, lumbar region: Secondary | ICD-10-CM | POA: Diagnosis not present

## 2015-10-15 DIAGNOSIS — R600 Localized edema: Secondary | ICD-10-CM | POA: Diagnosis not present

## 2015-10-15 DIAGNOSIS — M6281 Muscle weakness (generalized): Secondary | ICD-10-CM | POA: Diagnosis not present

## 2015-10-16 DIAGNOSIS — R262 Difficulty in walking, not elsewhere classified: Secondary | ICD-10-CM | POA: Diagnosis not present

## 2015-10-16 DIAGNOSIS — M6281 Muscle weakness (generalized): Secondary | ICD-10-CM | POA: Diagnosis not present

## 2015-10-16 DIAGNOSIS — G9529 Other cord compression: Secondary | ICD-10-CM | POA: Diagnosis not present

## 2015-10-16 DIAGNOSIS — R600 Localized edema: Secondary | ICD-10-CM | POA: Diagnosis not present

## 2015-10-16 DIAGNOSIS — M4326 Fusion of spine, lumbar region: Secondary | ICD-10-CM | POA: Diagnosis not present

## 2015-10-17 DIAGNOSIS — M79604 Pain in right leg: Secondary | ICD-10-CM | POA: Diagnosis not present

## 2015-10-17 DIAGNOSIS — R262 Difficulty in walking, not elsewhere classified: Secondary | ICD-10-CM | POA: Diagnosis not present

## 2015-10-17 DIAGNOSIS — R29898 Other symptoms and signs involving the musculoskeletal system: Secondary | ICD-10-CM | POA: Diagnosis not present

## 2015-10-17 DIAGNOSIS — M21371 Foot drop, right foot: Secondary | ICD-10-CM | POA: Diagnosis not present

## 2015-10-17 DIAGNOSIS — M6281 Muscle weakness (generalized): Secondary | ICD-10-CM | POA: Diagnosis not present

## 2015-10-17 DIAGNOSIS — R600 Localized edema: Secondary | ICD-10-CM | POA: Diagnosis not present

## 2015-10-17 DIAGNOSIS — G9529 Other cord compression: Secondary | ICD-10-CM | POA: Diagnosis not present

## 2015-10-17 DIAGNOSIS — M5124 Other intervertebral disc displacement, thoracic region: Secondary | ICD-10-CM | POA: Diagnosis not present

## 2015-10-17 DIAGNOSIS — M4326 Fusion of spine, lumbar region: Secondary | ICD-10-CM | POA: Diagnosis not present

## 2015-10-20 DIAGNOSIS — G9529 Other cord compression: Secondary | ICD-10-CM | POA: Diagnosis not present

## 2015-10-20 DIAGNOSIS — M4326 Fusion of spine, lumbar region: Secondary | ICD-10-CM | POA: Diagnosis not present

## 2015-10-20 DIAGNOSIS — M6281 Muscle weakness (generalized): Secondary | ICD-10-CM | POA: Diagnosis not present

## 2015-10-20 DIAGNOSIS — R262 Difficulty in walking, not elsewhere classified: Secondary | ICD-10-CM | POA: Diagnosis not present

## 2015-10-20 DIAGNOSIS — R600 Localized edema: Secondary | ICD-10-CM | POA: Diagnosis not present

## 2015-10-20 LAB — BASIC METABOLIC PANEL
BUN: 21 mg/dL (ref 4–21)
Creatinine: 0.8 mg/dL (ref 0.5–1.1)
GLUCOSE: 105 mg/dL
Potassium: 3.3 mmol/L — AB (ref 3.4–5.3)
SODIUM: 144 mmol/L (ref 137–147)

## 2015-10-21 DIAGNOSIS — M6281 Muscle weakness (generalized): Secondary | ICD-10-CM | POA: Diagnosis not present

## 2015-10-21 DIAGNOSIS — R262 Difficulty in walking, not elsewhere classified: Secondary | ICD-10-CM | POA: Diagnosis not present

## 2015-10-21 DIAGNOSIS — M4326 Fusion of spine, lumbar region: Secondary | ICD-10-CM | POA: Diagnosis not present

## 2015-10-21 DIAGNOSIS — G9529 Other cord compression: Secondary | ICD-10-CM | POA: Diagnosis not present

## 2015-10-21 DIAGNOSIS — R600 Localized edema: Secondary | ICD-10-CM | POA: Diagnosis not present

## 2015-10-22 ENCOUNTER — Encounter: Payer: Self-pay | Admitting: *Deleted

## 2015-10-22 ENCOUNTER — Encounter: Payer: Self-pay | Admitting: Adult Health

## 2015-10-22 ENCOUNTER — Non-Acute Institutional Stay (SKILLED_NURSING_FACILITY): Payer: Medicare Other | Admitting: Adult Health

## 2015-10-22 DIAGNOSIS — R600 Localized edema: Secondary | ICD-10-CM

## 2015-10-22 DIAGNOSIS — M4804 Spinal stenosis, thoracic region: Secondary | ICD-10-CM | POA: Diagnosis not present

## 2015-10-22 DIAGNOSIS — K219 Gastro-esophageal reflux disease without esophagitis: Secondary | ICD-10-CM | POA: Diagnosis not present

## 2015-10-22 DIAGNOSIS — E46 Unspecified protein-calorie malnutrition: Secondary | ICD-10-CM

## 2015-10-22 DIAGNOSIS — K5900 Constipation, unspecified: Secondary | ICD-10-CM

## 2015-10-22 DIAGNOSIS — E43 Unspecified severe protein-calorie malnutrition: Secondary | ICD-10-CM

## 2015-10-22 DIAGNOSIS — N3281 Overactive bladder: Secondary | ICD-10-CM | POA: Diagnosis not present

## 2015-10-22 DIAGNOSIS — I482 Chronic atrial fibrillation, unspecified: Secondary | ICD-10-CM

## 2015-10-22 DIAGNOSIS — I10 Essential (primary) hypertension: Secondary | ICD-10-CM | POA: Diagnosis not present

## 2015-10-22 DIAGNOSIS — M6281 Muscle weakness (generalized): Secondary | ICD-10-CM | POA: Diagnosis not present

## 2015-10-22 DIAGNOSIS — R262 Difficulty in walking, not elsewhere classified: Secondary | ICD-10-CM | POA: Diagnosis not present

## 2015-10-22 DIAGNOSIS — G9529 Other cord compression: Secondary | ICD-10-CM | POA: Diagnosis not present

## 2015-10-22 DIAGNOSIS — E876 Hypokalemia: Secondary | ICD-10-CM

## 2015-10-22 DIAGNOSIS — E785 Hyperlipidemia, unspecified: Secondary | ICD-10-CM

## 2015-10-22 DIAGNOSIS — M4326 Fusion of spine, lumbar region: Secondary | ICD-10-CM | POA: Diagnosis not present

## 2015-10-22 NOTE — Progress Notes (Signed)
Patient ID: [name] [name], female   DOB: [birth year], 82 y.o.   MRN: [id number]    DATE:   10/22/15  MRN:  [id number]  BIRTHDAY: [birth year]  Facility:  Nursing Home Location:  [place] Room Number: 1107-P  LEVEL OF CARE:  SNF (31)  Contact Information    Name Relation Home Work Mobile   Nordlund,John Son [phone]     Jagoda,Gay Relative   [phone]       Code Status History    Date Active Date Inactive Code Status Order ID Comments User Context   09/19/2015  7:32 AM 09/23/2015  5:44 PM DNR [id number]  Kevan Ny Ditty, MD Inpatient   09/15/2015  4:14 PM 09/19/2015  7:32 AM Full Code [id number]  Ardeen Jourdain, PA-C Inpatient   05/02/2012 10:48 PM 05/05/2012  8:35 PM Full Code [id number]  Blinda Leatherwood, RN Inpatient    Questions for Most Recent Historical Code Status (Order [id number])    Question Answer Comment   In the event of cardiac or respiratory ARREST Do not call a "code blue"    In the event of cardiac or respiratory ARREST Do not perform Intubation, CPR, defibrillation or ACLS    In the event of cardiac or respiratory ARREST Use medication by any route, position, wound care, and other measures to relive pain and suffering. May use oxygen, suction and manual treatment of airway obstruction as needed for comfort.     Advance Directive Documentation        Most Recent Value   Type of Advance Directive  Out of facility DNR (pink MOST or yellow form)   Pre-existing out of facility DNR order (yellow form or pink MOST form)     "MOST" Form in Place?         Chief Complaint  Patient presents with  . Medical Management of Chronic Issues    HISTORY OF PRESENT ILLNESS:  This is a 82 year old female who is being seen for a routine visit. She was recently started on Lasix due to BLE edema. Review of labs showed K 3.3, low. No complaints of numbness. Protonix was recently discontinued since she is also on Ranitidine. She was started on Procel due  to low albumin, 2.65  She has been admitted to [facility] on 09/23/15 from [facility]. She has PMH of atrial fibrillation, hypertension and dyslipidemia. She was hospitalized due to bilateral lower extremity weakness. 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 She had thoracic laminectomies for decompression T2-T3, T3-T4, T5-T6 on 09/19/15 by neurosurgery.  She has been admitted to [facility] for short-term rehabilitation.  PAST MEDICAL HISTORY:  Past Medical History  Diagnosis Date  . Chronic atrial fibrillation ([place])   . Hypertension   . Hyperlipidemia   . Osteoarthritis   . Diabetes ([place])     prediabetes  . Weakness of both lower extremities   . Acute blood loss anemia   . Protein calorie malnutrition ([place])   . Spinal stenosis, thoracic   . UTI (lower urinary tract infection)   . Constipation   . Pressure ulcer      CURRENT MEDICATIONS: Reviewed    Medication List       This list is accurate as of: 10/22/15 11:59 PM.  Always use your most recent med list.               acetaminophen 325 MG tablet  Commonly known  as:  TYLENOL  Take 2 tablets (650 mg total) by mouth every 4 (four) hours as needed (temp > 100.5).     bisoprolol-hydrochlorothiazide 10-6.25 MG tablet  Commonly known as:  ZIAC  Take 1 tablet by mouth 2 (two) times daily.     calcium carbonate 750 MG chewable tablet  Commonly known as:  TUMS EX  Chew 2 tablets by mouth daily as needed for heartburn.     COUMADIN PO  Take 3.5 mg by mouth daily. Take a 1 mg tablet along with a 2.5 mg tablet to = 3.5 mg qd.  Recheck INR 10/23/15     cyclobenzaprine 5 MG tablet  Commonly known as:  FLEXERIL  Take 5 mg by mouth 2 (two) times daily as needed for muscle spasms.     diphenhydrAMINE 25 MG tablet  Commonly known as:  BENADRYL  Take 25 mg by mouth at bedtime as needed.     furosemide 40 MG tablet  Commonly known as:  LASIX  Take 40 mg by mouth daily. BLE  edema     hydrocortisone cream 1 %  Apply 1 application topically daily as needed for itching.     loperamide 2 MG tablet  Commonly known as:  IMODIUM A-D  Take 4 mg by mouth 4 (four) times daily as needed for diarrhea or loose stools.     losartan 100 MG tablet  Commonly known as:  COZAAR  Take 100 mg by mouth daily.     multivitamin with minerals Tabs tablet  Take 1 tablet by mouth daily.     oxybutynin 5 MG tablet  Commonly known as:  DITROPAN  Take 5 mg by mouth 2 (two) times daily.     oxyCODONE-acetaminophen 5-325 MG tablet  Commonly known as:  PERCOCET/ROXICET  Take 1-2 tablets by mouth every 4 (four) hours as needed for moderate pain.     polyethylene glycol packet  Commonly known as:  MIRALAX / GLYCOLAX  Take 17 g by mouth daily.     potassium chloride SA 20 MEQ tablet  Commonly known as:  K-DUR,KLOR-CON  Take 20 mEq by mouth daily. Q Mondays-Wednesdays-Fridays     PROCEL Powd  Take 2 scoop by mouth 2 (two) times daily.     ranitidine 75 MG tablet  Commonly known as:  ZANTAC  Take 75 mg by mouth daily as needed for heartburn.     rosuvastatin 10 MG tablet  Commonly known as:  CRESTOR  Take 10 mg by mouth. M-W-F at bedtime     senna 8.6 MG Tabs tablet  Commonly known as:  SENOKOT  Take 1 tablet (8.6 mg total) by mouth 2 (two) times daily.     Vitamin D2 2000 units Tabs  Take 1 tablet by mouth daily.         Allergies  Allergen Reactions  . Nsaids     On coumadin  . Statins     Cause leg cramps     REVIEW OF SYSTEMS:  GENERAL: no change in appetite, no fatigue, no weight changes, no fever, chills or weakness EYES: Denies change in vision, dry eyes, eye pain, itching or discharge EARS: Denies change in hearing, ringing in ears, or earache NOSE: Denies nasal congestion or epistaxis MOUTH and THROAT: Denies oral discomfort, gingival pain or bleeding, pain from teeth or hoarseness   RESPIRATORY: no cough, SOB, DOE, wheezing,  hemoptysis CARDIAC: no chest pain, or palpitations, +edema GI: no abdominal pain, diarrhea, constipation, heart burn,  nausea or vomiting GU: Denies dysuria, frequency, hematuria, incontinence, or discharge PSYCHIATRIC: Denies feeling of depression or anxiety. No report of hallucinations, insomnia, paranoia, or agitation    PHYSICAL EXAMINATION  GENERAL APPEARANCE: Well nourished. In no acute distress. Normal body habitus SKIN:  Midline upper back surgical incision is dry, no erythema HEAD: Normal in size and contour. No evidence of trauma EYES: Lids open and close normally. No blepharitis, entropion or ectropion. PERRL. Conjunctivae are clear and sclerae are white. Lenses are without opacity EARS: Pinnae are normal. Patient hears normal voice tunes of the examiner MOUTH and THROAT: Lips are without lesions. Oral mucosa is moist and without lesions. Tongue is normal in shape, size, and color and without lesions NECK: supple, trachea midline, no neck masses, no thyroid tenderness, no thyromegaly LYMPHATICS: no LAN in the neck, no supraclavicular LAN RESPIRATORY: breathing is even & unlabored, BS CTAB CARDIAC: Irregularly irregular, no murmur,no extra heart sounds, BLE edema 2+ GI: abdomen soft, normal BS, no masses, no tenderness, no hepatomegaly, no splenomegaly EXTREMITIES:  Able to move X 4 extremities PSYCHIATRIC: Alert and oriented X 3. Affect and behavior are appropriate  LABS/RADIOLOGY: Labs reviewed: Basic Metabolic Panel:  Recent Labs  09/16/15 0433 09/19/15 0523 09/20/15 09/20/15 0546 09/29/15  NA 140 140 138 138 141  K 4.8 4.2  --  5.0 4.5  CL 102 106  --  106  --   CO2 28 27  --  24  --   GLUCOSE 153* 138*  --  131*  --   BUN 49* 31* 23* 23* 16  CREATININE 1.61* 1.05* 0.8 0.84 0.8  CALCIUM 8.6* 8.3*  --  8.0*  --    Liver Function Tests:  Recent Labs  09/15/15 1638 09/16/15 0433 09/29/15  AST 27 23 17   ALT 19 17 13   ALKPHOS 54 52 90  BILITOT 2.4* 2.5*  --    PROT 6.4* 6.2*  --   ALBUMIN 3.2* 3.0*  --     CBC:  Recent Labs  02/27/15 1615 09/15/15 1638 09/16/15 0433 09/19/15 0523 09/20/15 09/20/15 0546 09/29/15  WBC 7.9 10.3 9.8 8.9 8.8 8.8 8.3  NEUTROABS 5.3 7.6  --   --   --   --  5  HGB 14.3 10.3* 10.0* 10.0*  --  9.2* 9.3*  HCT 44.2 31.2* 31.8* 31.9*  --  28.3* 31*  MCV 95.3 92.6 97.2 94.9  --  95.0  --   PLT 205 303 309 296  --  278 423*   Cardiac Enzymes:  Recent Labs  09/22/15 1346 09/22/15 1840 09/23/15 0025  TROPONINI 0.03 <0.03 <0.03   CBG:  Recent Labs  09/22/15 2208 09/23/15 0847 09/23/15 1114  GLUCAP 141* 114* 154*      ASSESSMENT/PLAN:  Lower extremity weakness - continue rehabilitation  Thoracic stenosis with myelopathy S/P Laminectomy and decompression - continue rehabilitation; continue Percocet 5/325 mg 1-2 tab PO Q 4 hours PRN and Tylenol 325 mg take 2 tabs Q 4 hours PRN; cyclobenzaprine 5 mg 1 tab by mouth twice a day when necessary for muscle spasm  GERD - continue ranitidine 75 mg 1 tab by mouth daily when necessary  Constipation - continue MiraLAX 17 g by mouth daily and Senokot 1 tab by mouth twice a day  Hypertension - well-controlled; continue Cozaar 100 mg 1 tab by mouth daily and Bisoprolol-HCTZ 10-6.25 mg BID  Hyperlipidemia - continue Crestor 10 mg 1 tab by mouth daily at bedtime  OAB - continue  oxybutynin 5 mg 1 tab by mouth twice a day  BLE Edema - continue Lasix 40 mg daily  Chronic atrial fibrillation - rate controlled; continue Coumadin and Bisoprolol-HCTZ  Hypokalemia - K 3.3; start KCL 20 meq 1 tab PO Q M-W-F; BMP on 10/27/15  Protein calorie malnutrition - continue Procel 2 scoops by mouth twice a day    Goals of care:  Short-term rehabilitation    [facility], [facility] [phone]

## 2015-10-23 ENCOUNTER — Other Ambulatory Visit: Payer: Self-pay | Admitting: *Deleted

## 2015-10-23 NOTE — Patient Outreach (Signed)
[facility]) Care Management  10/23/2015  [name] [name] Nov 13, 1933 [id number]   CSW was able to meet with patient today at [facility], [place] where patient currently resides to receive short-term rehabilitative services, to perform a routine visit.  Patient was in great spirits today, reporting that she is optimistic about her plan of care and her ability to now be able to walk 80 steps with her walker.  Patient reported, "Before I came here, I could not walk at all, now I am walking and actually working toward climbing steps".  CSW congratulated patient and encouraged her to keep up the good work.  Patient denies experiencing any pain at present.  Patient commended the staff at [facility], reporting that she has had a great experience, all around.  Patient is hopeful that she will be able to return home to live independently in two weeks.  CSW agreed to attend patient's Discharge Planning Meeting, scheduled for Wednesday, Oct 29, 2015.  At that time, CSW will assist with possible discharge planning needs and services, which will include arranging home health services, as well as ordering durable medical equipment. [name], BSW, MSW, LCSW  Licensed Education officer, environmental Health System  Mailing [place] [address], [place], [place] [address] Physical Address-[address], [place], [place] [address] Toll Free Main # [phone] Fax # [phone] Cell # [phone]  Fax # [phone]  [name]  Patient's preferred language:  [place]   English  ATTENTION:  If you speak [place], language assistance services, free of charge, are available to you.    Nondiscrimination and Accessibility Statement: Discrimination is Against the [employer], a subsidiary of [employer], complies with [employer] civil rights laws and does not discriminate on the basis of  race, color, national origin, age, disability, or sex.  [place] does not exclude people or treat them differently because of race, color, national origin, age, disability, or sex.  [place] Providers will:  . Provide free aids and services to people with disabilities to communicate effectively with [place], such as:     ? Qualified sign language interpreters  ? Written information in other formats (large print, audio, accessible electronic formats, other formats)   . Provide free language services to people whose primary language is not [place], such as:    ? Qualified interpreters    ? Information written in other languages   If you need these services, contact your [facility] psychologist.  If you believe that a [facility] [employer] has failed to provide these services or discriminated in another way on the basis of race, color, national origin, age, disability, or sex, you can file a Tourist information centre manager with: [place], [phone] or [email or web address]  You can file a grievance in person or by mail, fax, or email. If you need help filing a grievance, you may contact Valrie Hart, Interim Compliance Officer, [facility] Department of Compliance and Integrity, [place]nd Floor, [employer], [place]. [place], [phone], [name].    You can also file a civil rights complaint with the U.S. Department of Health and Financial controller, Office for [employer], electronically through the Office for Civil Rights Complaint Portal, available at OnSiteLending.nl.jsf, or by mail or phone at:  [place]. Department of Health and Human Services [address] [place] [phone], [facility] Building [place], [place]  [phone], [phone] (TDD) Complaint forms  are available at [email or web address].

## 2015-10-24 DIAGNOSIS — G9529 Other cord compression: Secondary | ICD-10-CM | POA: Diagnosis not present

## 2015-10-24 DIAGNOSIS — M4326 Fusion of spine, lumbar region: Secondary | ICD-10-CM | POA: Diagnosis not present

## 2015-10-24 DIAGNOSIS — R262 Difficulty in walking, not elsewhere classified: Secondary | ICD-10-CM | POA: Diagnosis not present

## 2015-10-24 DIAGNOSIS — R600 Localized edema: Secondary | ICD-10-CM | POA: Diagnosis not present

## 2015-10-24 DIAGNOSIS — M6281 Muscle weakness (generalized): Secondary | ICD-10-CM | POA: Diagnosis not present

## 2015-10-27 DIAGNOSIS — M6281 Muscle weakness (generalized): Secondary | ICD-10-CM | POA: Diagnosis not present

## 2015-10-27 DIAGNOSIS — R29898 Other symptoms and signs involving the musculoskeletal system: Secondary | ICD-10-CM | POA: Diagnosis not present

## 2015-10-27 DIAGNOSIS — N3281 Overactive bladder: Secondary | ICD-10-CM | POA: Diagnosis not present

## 2015-10-27 DIAGNOSIS — M4326 Fusion of spine, lumbar region: Secondary | ICD-10-CM | POA: Diagnosis not present

## 2015-10-27 DIAGNOSIS — R600 Localized edema: Secondary | ICD-10-CM | POA: Diagnosis not present

## 2015-10-27 DIAGNOSIS — E46 Unspecified protein-calorie malnutrition: Secondary | ICD-10-CM | POA: Diagnosis not present

## 2015-10-27 DIAGNOSIS — R262 Difficulty in walking, not elsewhere classified: Secondary | ICD-10-CM | POA: Diagnosis not present

## 2015-10-27 DIAGNOSIS — I4891 Unspecified atrial fibrillation: Secondary | ICD-10-CM | POA: Diagnosis not present

## 2015-10-27 DIAGNOSIS — K219 Gastro-esophageal reflux disease without esophagitis: Secondary | ICD-10-CM | POA: Diagnosis not present

## 2015-10-27 DIAGNOSIS — G9529 Other cord compression: Secondary | ICD-10-CM | POA: Diagnosis not present

## 2015-10-27 DIAGNOSIS — M546 Pain in thoracic spine: Secondary | ICD-10-CM | POA: Diagnosis not present

## 2015-10-27 DIAGNOSIS — R300 Dysuria: Secondary | ICD-10-CM | POA: Diagnosis not present

## 2015-10-27 DIAGNOSIS — E876 Hypokalemia: Secondary | ICD-10-CM | POA: Diagnosis not present

## 2015-10-27 DIAGNOSIS — M4804 Spinal stenosis, thoracic region: Secondary | ICD-10-CM | POA: Diagnosis not present

## 2015-10-27 DIAGNOSIS — N390 Urinary tract infection, site not specified: Secondary | ICD-10-CM | POA: Diagnosis not present

## 2015-10-27 DIAGNOSIS — E785 Hyperlipidemia, unspecified: Secondary | ICD-10-CM | POA: Diagnosis not present

## 2015-10-27 DIAGNOSIS — I10 Essential (primary) hypertension: Secondary | ICD-10-CM | POA: Diagnosis not present

## 2015-10-27 DIAGNOSIS — I482 Chronic atrial fibrillation: Secondary | ICD-10-CM | POA: Diagnosis not present

## 2015-10-27 DIAGNOSIS — M21371 Foot drop, right foot: Secondary | ICD-10-CM | POA: Diagnosis not present

## 2015-10-27 DIAGNOSIS — M4324 Fusion of spine, thoracic region: Secondary | ICD-10-CM | POA: Diagnosis not present

## 2015-10-27 DIAGNOSIS — K5900 Constipation, unspecified: Secondary | ICD-10-CM | POA: Diagnosis not present

## 2015-10-27 LAB — BASIC METABOLIC PANEL
BUN: 23 mg/dL — AB (ref 4–21)
CREATININE: 1 mg/dL (ref 0.5–1.1)
Glucose: 105 mg/dL
POTASSIUM: 4 mmol/L (ref 3.4–5.3)
Sodium: 144 mmol/L (ref 137–147)

## 2015-10-29 ENCOUNTER — Other Ambulatory Visit: Payer: Self-pay | Admitting: *Deleted

## 2015-10-29 DIAGNOSIS — R600 Localized edema: Secondary | ICD-10-CM | POA: Diagnosis not present

## 2015-10-29 DIAGNOSIS — R262 Difficulty in walking, not elsewhere classified: Secondary | ICD-10-CM | POA: Diagnosis not present

## 2015-10-29 DIAGNOSIS — G9529 Other cord compression: Secondary | ICD-10-CM | POA: Diagnosis not present

## 2015-10-29 DIAGNOSIS — M4324 Fusion of spine, thoracic region: Secondary | ICD-10-CM | POA: Diagnosis not present

## 2015-10-29 DIAGNOSIS — M6281 Muscle weakness (generalized): Secondary | ICD-10-CM | POA: Diagnosis not present

## 2015-10-29 NOTE — Patient Outreach (Signed)
[facility] [facility]) Care Management  10/29/2015  [name] [name] July 28, 1933 [id number]  CSW was able to meet with patient today at [facility] [place] where patient currently resides to receive short-term rehabilitative services, to attend the Discharge Planning Meeting.  Patient is scheduled for discharge from [facility] on Saturday, Nov 08, 2015.  Patient plans to return home to live independently.  Home health services has been arranged for patient, as well as durable medical equipment.  Patient reports being ready to return home to perform all activities of daily living independently.  No additional social work needs identified by patient. CSW will perform a case closure on patient, as all goals of treatment have been met from social work standpoint and no additional social work needs have been identified at this time. CSW will notify patient's RNCM with [place] Management, Dannielle Huh of CSW's plans to close patient's case. CSW will fax a correspondence letter to patient's Primary Care Physician, Dr. Hulan Fess to ensure that Dr. Rex Kras is aware of CSW's involvement with patient. CSW will submit a case closure request to Lurline Del, Care Management Assistant with [place] [facility], in the form of an In [employer].  CSW will ensure that [name] [name] is aware of Dannielle Huh, RNCM with [place] [facility], continued involvement with patient's care.  [name], BSW, MSW, LCSW  Licensed Education officer, environmental Health System  Mailing [place] [address] [place], [place] [address] Physical Address-[address], [place], [place] [address] Toll Free Main # [phone] Fax # [phone] Cell # [phone]  Fax # [phone]  [email or web address]  Patient's preferred language:  [place]   English  ATTENTION:  If you speak  [place], language assistance services, free of charge, are available to you.    Nondiscrimination and Accessibility Statement: Discrimination is Against the [employer], a subsidiary of [employer], complies with [employer] civil rights laws and does not discriminate on the basis of race, color, national origin, age, disability, or sex.  [place] does not exclude people or treat them differently because of race, color, national origin, age, disability, or sex.  [place] Providers will:  . Provide free aids and services to people with disabilities to communicate effectively with [place], such as:     ? Qualified sign language interpreters  ? Written information in other formats (large print, audio, accessible electronic formats, other formats)   . Provide free language services to people whose primary language is not [place], such as:    ? Qualified interpreters    ? Information written in other languages   If you need these services, contact your [facility] psychologist.  If you believe that a [facility] [employer] has failed to provide these services or discriminated in another way on the basis of race, color, national origin, age, disability, or sex, you can file a Tourist information centre manager with: [place], [phone] or [email or web address]  You can file a grievance in person or by mail, fax, or email. If you need help filing a grievance, you may contact Valrie Hart, Interim Compliance Officer, [facility] Department of Compliance and Integrity, [place]nd Floor, [place], [place]. [place], [phone], [email or web address].    You can also file a civil rights complaint with the U.S. Department of Health and Financial controller, Office for [employer], electronically through the Office  for Civil Rights Complaint Portal, available at  OnSiteLending.nl.jsf, or by mail or phone at:  [place]. Department of Health and Human Services [address] [place] Room [phone], [facility] Building [place], [place]  [phone], [phone] (TDD) Complaint forms are available at [email or web address].

## 2015-10-31 ENCOUNTER — Encounter: Payer: Self-pay | Admitting: Adult Health

## 2015-10-31 ENCOUNTER — Non-Acute Institutional Stay (SKILLED_NURSING_FACILITY): Payer: Medicare Other | Admitting: Adult Health

## 2015-10-31 DIAGNOSIS — G9529 Other cord compression: Secondary | ICD-10-CM | POA: Diagnosis not present

## 2015-10-31 DIAGNOSIS — M4324 Fusion of spine, thoracic region: Secondary | ICD-10-CM | POA: Diagnosis not present

## 2015-10-31 DIAGNOSIS — N390 Urinary tract infection, site not specified: Secondary | ICD-10-CM

## 2015-10-31 DIAGNOSIS — M6281 Muscle weakness (generalized): Secondary | ICD-10-CM | POA: Diagnosis not present

## 2015-10-31 DIAGNOSIS — R262 Difficulty in walking, not elsewhere classified: Secondary | ICD-10-CM | POA: Diagnosis not present

## 2015-10-31 DIAGNOSIS — R600 Localized edema: Secondary | ICD-10-CM | POA: Diagnosis not present

## 2015-10-31 LAB — BASIC METABOLIC PANEL
BUN: 22 mg/dL — AB (ref 4–21)
Creatinine: 0.9 mg/dL (ref 0.5–1.1)
GLUCOSE: 107 mg/dL
POTASSIUM: 3.9 mmol/L (ref 3.4–5.3)
SODIUM: 143 mmol/L (ref 137–147)

## 2015-10-31 NOTE — Progress Notes (Signed)
Patient ID: [name] [name], female   DOB: 1934-02-12, 82 y.o.   MRN: [id number]    DATE:   10/31/15  MRN:  [id number]  BIRTHDAY: 02/28/1934  Facility:  Nursing Home Location:  [place] [facility] [place] Room Number: 1107-P  LEVEL OF CARE:  SNF (31)      Contact Information    Name Relation Home Work Mobile   Wrubel,John Son [phone]     Ramseur,Gay Relative   [phone]       Code Status History    Date Active Date Inactive Code Status Order ID Comments User Context   09/19/2015  7:32 AM 09/23/2015  5:44 PM DNR [id number]  Kevan Ny Ditty, MD Inpatient   09/15/2015  4:14 PM 09/19/2015  7:32 AM Full Code [id number]  Ardeen Jourdain, PA-C Inpatient   05/02/2012 10:48 PM 05/05/2012  8:35 PM Full Code [id number]  Blinda Leatherwood, RN Inpatient    Questions for Most Recent Historical Code Status (Order [id number])    Question Answer Comment   In the event of cardiac or respiratory ARREST Do not call a "code blue"    In the event of cardiac or respiratory ARREST Do not perform Intubation, CPR, defibrillation or ACLS    In the event of cardiac or respiratory ARREST Use medication by any route, position, wound care, and other measures to relive pain and suffering. May use oxygen, suction and manual treatment of airway obstruction as needed for comfort.     Advance Directive Documentation        Most Recent Value   Type of Advance Directive  Out of facility DNR (pink MOST or yellow form)   Pre-existing out of facility DNR order (yellow form or pink MOST form)     "MOST" Form in Place?         Chief Complaint  Patient presents with  . Acute Visit    UTI    HISTORY OF PRESENT ILLNESS:  This is a 82 year old female who complained of increase urinary frequency. Urine culture showed > 100,000 CFU/ml E. Coli. No hematuria nor fever was noted.  PAST MEDICAL HISTORY:  Past Medical History  Diagnosis Date  . Chronic atrial fibrillation ([place])   . Hypertension   .  Hyperlipidemia   . Osteoarthritis   . Diabetes ([place])     prediabetes  . Weakness of both lower extremities   . Acute blood loss anemia   . Protein calorie malnutrition ([place])   . Spinal stenosis, thoracic   . UTI (lower urinary tract infection)   . Constipation   . Pressure ulcer   . Bilateral lower extremity edema   . GERD (gastroesophageal reflux disease)   . OAB (overactive bladder)   . Hypokalemia      CURRENT MEDICATIONS: Reviewed    Medication List       This list is accurate as of: 10/31/15 11:59 PM.  Always use your most recent med list.               acetaminophen 325 MG tablet  Commonly known as:  TYLENOL  Take 2 tablets (650 mg total) by mouth every 4 (four) hours as needed (temp > 100.5).     bisoprolol-hydrochlorothiazide 10-6.25 MG tablet  Commonly known as:  ZIAC  Take 1 tablet by mouth 2 (two) times daily.     calcium carbonate 750 MG chewable tablet  Commonly known as:  TUMS EX  Chew 2 tablets by mouth daily as  needed for heartburn.     COUMADIN PO  Take 3.5 mg by mouth daily. Take a 1 mg tablet along with a 2.5 mg tablet to = 3.5 mg qd.  Recheck INR 10/23/15     cyclobenzaprine 5 MG tablet  Commonly known as:  FLEXERIL  Take 5 mg by mouth 2 (two) times daily as needed for muscle spasms.     diphenhydrAMINE 25 MG tablet  Commonly known as:  BENADRYL  Take 25 mg by mouth at bedtime as needed.     furosemide 20 MG tablet  Commonly known as:  LASIX  Take 20 mg by mouth every other day.     hydrocortisone cream 1 %  Apply 1 application topically daily as needed for itching.     loperamide 2 MG tablet  Commonly known as:  IMODIUM A-D  Take 4 mg by mouth 4 (four) times daily as needed for diarrhea or loose stools.     losartan 100 MG tablet  Commonly known as:  COZAAR  Take 100 mg by mouth daily.     multivitamin with minerals Tabs tablet  Take 1 tablet by mouth daily.     oxybutynin 5 MG tablet  Commonly known as:  DITROPAN  Take 5 mg by  mouth 2 (two) times daily.     oxyCODONE-acetaminophen 5-325 MG tablet  Commonly known as:  PERCOCET/ROXICET  Take 1-2 tablets by mouth every 4 (four) hours as needed for moderate pain.     polyethylene glycol packet  Commonly known as:  MIRALAX / GLYCOLAX  Take 17 g by mouth daily as needed.     potassium chloride SA 20 MEQ tablet  Commonly known as:  K-DUR,KLOR-CON  Take 20 mEq by mouth daily. Q Mondays-Wednesdays-Fridays     PROCEL Powd  Take 2 scoop by mouth 2 (two) times daily.     ranitidine 75 MG tablet  Commonly known as:  ZANTAC  Take 75 mg by mouth daily as needed for heartburn.     rosuvastatin 10 MG tablet  Commonly known as:  CRESTOR  Take 10 mg by mouth. M-W-F at bedtime     saccharomyces boulardii 250 MG capsule  Commonly known as:  FLORASTOR  Take 250 mg by mouth 2 (two) times daily. Take for 10 days     sennosides-docusate sodium 8.6-50 MG tablet  Commonly known as:  SENOKOT-S  Take 2 tablets by mouth daily as needed for constipation.     sulfamethoxazole-trimethoprim 800-160 MG tablet  Commonly known as:  BACTRIM DS,SEPTRA DS  Take 1 tablet by mouth 2 (two) times daily. Take for 7 days     Vitamin D2 2000 units Tabs  Take 1 tablet by mouth daily.         Allergies  Allergen Reactions  . Nsaids     On coumadin  . Statins     Cause leg cramps     REVIEW OF SYSTEMS:  GENERAL: no change in appetite, no fatigue, no weight changes, no fever, chills or weakness EYES: Denies change in vision, dry eyes, eye pain, itching or discharge EARS: Denies change in hearing, ringing in ears, or earache NOSE: Denies nasal congestion or epistaxis MOUTH and THROAT: Denies oral discomfort, gingival pain or bleeding, pain from teeth or hoarseness   RESPIRATORY: no cough, SOB, DOE, wheezing, hemoptysis CARDIAC: no chest pain, or palpitations, +edema GI: no abdominal pain, diarrhea, constipation, heart burn, nausea or vomiting GU: Denies dysuria, hematuria,  incontinence, or discharge, +increase  urinary frequency PSYCHIATRIC: Denies feeling of depression or anxiety. No report of hallucinations, insomnia, paranoia, or agitation    PHYSICAL EXAMINATION  GENERAL APPEARANCE: Well nourished. In no acute distress. Obese SKIN:  Midline upper back surgical incision is dry, no erythema HEAD: Normal in size and contour. No evidence of trauma EYES: Lids open and close normally. No blepharitis, entropion or ectropion. PERRL. Conjunctivae are clear and sclerae are white. Lenses are without opacity EARS: Pinnae are normal. Patient hears normal voice tunes of the examiner MOUTH and THROAT: Lips are without lesions. Oral mucosa is moist and without lesions. Tongue is normal in shape, size, and color and without lesions NECK: supple, trachea midline, no neck masses, no thyroid tenderness, no thyromegaly LYMPHATICS: no LAN in the neck, no supraclavicular LAN RESPIRATORY: breathing is even & unlabored, BS CTAB CARDIAC: Irregularly irregular, no murmur,no extra heart sounds, BLE edema 2+ GI: abdomen soft, normal BS, no masses, no tenderness, no hepatomegaly, no splenomegaly EXTREMITIES:  Able to move X 4 extremities PSYCHIATRIC: Alert and oriented X 3. Affect and behavior are appropriate  LABS/RADIOLOGY: Labs reviewed: Basic Metabolic Panel:  Recent Labs  09/16/15 0433 09/19/15 0523  09/20/15 0546  10/20/15 10/27/15 10/31/15  NA 140 140  < > 138  < > 144 144 143  K 4.8 4.2  --  5.0  < > 3.3* 4.0 3.9  CL 102 106  --  106  --   --   --   --   CO2 28 27  --  24  --   --   --   --   GLUCOSE 153* 138*  --  131*  --   --   --   --   BUN 49* 31*  < > 23*  < > 21 23* 22*  CREATININE 1.61* 1.05*  < > 0.84  < > 0.8 1.0 0.9  CALCIUM 8.6* 8.3*  --  8.0*  --   --   --   --   < > = values in this interval not displayed. Liver Function Tests:  Recent Labs  09/15/15 1638 09/16/15 0433 09/29/15  AST 27 23 17   ALT 19 17 13   ALKPHOS 54 52 90  BILITOT 2.4* 2.5*   --   PROT 6.4* 6.2*  --   ALBUMIN 3.2* 3.0*  --     CBC:  Recent Labs  02/27/15 1615 09/15/15 1638 09/16/15 0433 09/19/15 0523 09/20/15 09/20/15 0546 09/29/15  WBC 7.9 10.3 9.8 8.9 8.8 8.8 8.3  NEUTROABS 5.3 7.6  --   --   --   --  5  HGB 14.3 10.3* 10.0* 10.0*  --  9.2* 9.3*  HCT 44.2 31.2* 31.8* 31.9*  --  28.3* 31*  MCV 95.3 92.6 97.2 94.9  --  95.0  --   PLT 205 303 309 296  --  278 423*   Cardiac Enzymes:  Recent Labs  09/22/15 1346 09/22/15 1840 09/23/15 0025  TROPONINI 0.03 <0.03 <0.03   CBG:  Recent Labs  09/22/15 2208 09/23/15 0847 09/23/15 1114  GLUCAP 141* 114* 154*      ASSESSMENT/PLAN:  UTI - start Bactrim DS 1 tab PO BID X 7 days and Florastor 250 mg 1 capsule PO BID X 10 days    [name] Age, NP [employer] [phone]

## 2015-11-03 DIAGNOSIS — G9529 Other cord compression: Secondary | ICD-10-CM | POA: Diagnosis not present

## 2015-11-03 DIAGNOSIS — R600 Localized edema: Secondary | ICD-10-CM | POA: Diagnosis not present

## 2015-11-03 DIAGNOSIS — M4324 Fusion of spine, thoracic region: Secondary | ICD-10-CM | POA: Diagnosis not present

## 2015-11-03 DIAGNOSIS — M6281 Muscle weakness (generalized): Secondary | ICD-10-CM | POA: Diagnosis not present

## 2015-11-03 DIAGNOSIS — R262 Difficulty in walking, not elsewhere classified: Secondary | ICD-10-CM | POA: Diagnosis not present

## 2015-11-06 ENCOUNTER — Encounter: Payer: Self-pay | Admitting: Adult Health

## 2015-11-06 ENCOUNTER — Non-Acute Institutional Stay (SKILLED_NURSING_FACILITY): Payer: Medicare Other | Admitting: Adult Health

## 2015-11-06 DIAGNOSIS — I482 Chronic atrial fibrillation, unspecified: Secondary | ICD-10-CM

## 2015-11-06 DIAGNOSIS — M4804 Spinal stenosis, thoracic region: Secondary | ICD-10-CM | POA: Diagnosis not present

## 2015-11-06 DIAGNOSIS — M4324 Fusion of spine, thoracic region: Secondary | ICD-10-CM | POA: Diagnosis not present

## 2015-11-06 DIAGNOSIS — K219 Gastro-esophageal reflux disease without esophagitis: Secondary | ICD-10-CM | POA: Diagnosis not present

## 2015-11-06 DIAGNOSIS — G9529 Other cord compression: Secondary | ICD-10-CM | POA: Diagnosis not present

## 2015-11-06 DIAGNOSIS — E46 Unspecified protein-calorie malnutrition: Secondary | ICD-10-CM | POA: Diagnosis not present

## 2015-11-06 DIAGNOSIS — E785 Hyperlipidemia, unspecified: Secondary | ICD-10-CM | POA: Diagnosis not present

## 2015-11-06 DIAGNOSIS — R262 Difficulty in walking, not elsewhere classified: Secondary | ICD-10-CM | POA: Diagnosis not present

## 2015-11-06 DIAGNOSIS — N390 Urinary tract infection, site not specified: Secondary | ICD-10-CM | POA: Diagnosis not present

## 2015-11-06 DIAGNOSIS — I10 Essential (primary) hypertension: Secondary | ICD-10-CM

## 2015-11-06 DIAGNOSIS — K5900 Constipation, unspecified: Secondary | ICD-10-CM | POA: Diagnosis not present

## 2015-11-06 DIAGNOSIS — R29898 Other symptoms and signs involving the musculoskeletal system: Secondary | ICD-10-CM | POA: Diagnosis not present

## 2015-11-06 DIAGNOSIS — R600 Localized edema: Secondary | ICD-10-CM | POA: Diagnosis not present

## 2015-11-06 DIAGNOSIS — E876 Hypokalemia: Secondary | ICD-10-CM | POA: Diagnosis not present

## 2015-11-06 DIAGNOSIS — N3281 Overactive bladder: Secondary | ICD-10-CM | POA: Diagnosis not present

## 2015-11-06 DIAGNOSIS — M6281 Muscle weakness (generalized): Secondary | ICD-10-CM | POA: Diagnosis not present

## 2015-11-06 NOTE — Progress Notes (Signed)
Patient ID: [name] [name], female   DOB: Nov 10, 1933, 82 y.o.   MRN: [id number]    DATE:   11/06/15  MRN:  [id number]  BIRTHDAY: 11-05-1933  Facility:  Nursing Home Location:  [place] [facility] [place] Room Number: 1107-P  LEVEL OF CARE:  SNF (31)      Contact Information    Name Relation Home Work Mobile   Hallenbeck,John Son [phone]     Ironside,Gay Relative   [phone]       Code Status History    Date Active Date Inactive Code Status Order ID Comments User Context   09/19/2015  7:32 AM 09/23/2015  5:44 PM DNR [id number]  Kevan Ny Ditty, MD Inpatient   09/15/2015  4:14 PM 09/19/2015  7:32 AM Full Code [id number]  Ardeen Jourdain, PA-C Inpatient   05/02/2012 10:48 PM 05/05/2012  8:35 PM Full Code [id number]  Blinda Leatherwood, RN Inpatient    Questions for Most Recent Historical Code Status (Order [id number])    Question Answer Comment   In the event of cardiac or respiratory ARREST Do not call a "code blue"    In the event of cardiac or respiratory ARREST Do not perform Intubation, CPR, defibrillation or ACLS    In the event of cardiac or respiratory ARREST Use medication by any route, position, wound care, and other measures to relive pain and suffering. May use oxygen, suction and manual treatment of airway obstruction as needed for comfort.     Advance Directive Documentation        Most Recent Value   Type of Advance Directive  Out of facility DNR (pink MOST or yellow form)   Pre-existing out of facility DNR order (yellow form or pink MOST form)     "MOST" Form in Place?         Chief Complaint  Patient presents with  . Discharge Note    HISTORY OF PRESENT ILLNESS:  This is a 82 year old female who is for discharge home with Home health PT for endurance, OT for ADLs, CNA for showers and skilled Nurse to monitor INR levels. DME:  20" X 18" wheelchair (breezy 510), elevating leg rests, anti-tippers, break extenders, basic cushion and removable arm  rests.  She has been admitted to [facility] on 09/23/15 from [facility]. She has PMH of atrial fibrillation, hypertension and dyslipidemia. She was hospitalized due to bilateral lower extremity weakness. 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 She had thoracic laminectomies for decompression T2-T3, T3-T4, T5-T6 on 09/19/15 by neurosurgery.  Patient was admitted to this facility for short-term rehabilitation after the patient's recent hospitalization.  Patient has completed SNF rehabilitation and therapy has cleared the patient for discharge.  PAST MEDICAL HISTORY:  Past Medical History  Diagnosis Date  . Chronic atrial fibrillation ([place])   . Hypertension   . Hyperlipidemia   . Osteoarthritis   . Diabetes ([place])     prediabetes  . Weakness of both lower extremities   . Acute blood loss anemia   . Protein calorie malnutrition ([place])   . Spinal stenosis, thoracic   . UTI (lower urinary tract infection)   . Constipation   . Pressure ulcer   . Bilateral lower extremity edema   . GERD (gastroesophageal reflux disease)   . OAB (overactive bladder)   . Hypokalemia      CURRENT MEDICATIONS: Reviewed    Medication List  This list is accurate as of: 11/06/15 11:59 PM.  Always use your most recent med list.               acetaminophen 325 MG tablet  Commonly known as:  TYLENOL  Take 2 tablets (650 mg total) by mouth every 4 (four) hours as needed (temp > 100.5).     bisoprolol-hydrochlorothiazide 10-6.25 MG tablet  Commonly known as:  ZIAC  Take 1 tablet by mouth 2 (two) times daily.     calcium carbonate 750 MG chewable tablet  Commonly known as:  TUMS EX  Chew 2 tablets by mouth daily as needed for heartburn.     cyclobenzaprine 5 MG tablet  Commonly known as:  FLEXERIL  Take 5 mg by mouth 2 (two) times daily as needed for muscle spasms.     diphenhydrAMINE 25 MG tablet  Commonly known as:  BENADRYL  Take 25 mg by  mouth at bedtime as needed.     furosemide 20 MG tablet  Commonly known as:  LASIX  Take 20 mg by mouth every other day.     hydrocortisone cream 1 %  Apply 1 application topically daily as needed for itching.     loperamide 2 MG tablet  Commonly known as:  IMODIUM A-D  Take 4 mg by mouth 4 (four) times daily as needed for diarrhea or loose stools.     losartan 100 MG tablet  Commonly known as:  COZAAR  Take 100 mg by mouth daily.     multivitamin with minerals Tabs tablet  Take 1 tablet by mouth daily.     oxybutynin 5 MG tablet  Commonly known as:  DITROPAN  Take 5 mg by mouth 2 (two) times daily.     oxyCODONE-acetaminophen 5-325 MG tablet  Commonly known as:  PERCOCET/ROXICET  Take 1-2 tablets by mouth every 4 (four) hours as needed for moderate pain.     polyethylene glycol packet  Commonly known as:  MIRALAX / GLYCOLAX  Take 17 g by mouth daily as needed.     potassium chloride SA 20 MEQ tablet  Commonly known as:  K-DUR,KLOR-CON  Take 20 mEq by mouth daily. Q Mondays-Wednesdays-Fridays     PROCEL Powd  Take 2 scoop by mouth 2 (two) times daily.     ranitidine 75 MG tablet  Commonly known as:  ZANTAC  Take 75 mg by mouth daily as needed for heartburn.     rosuvastatin 10 MG tablet  Commonly known as:  CRESTOR  Take 10 mg by mouth. M-W-F at bedtime     saccharomyces boulardii 250 MG capsule  Commonly known as:  FLORASTOR  Take 250 mg by mouth 2 (two) times daily. Take for 10 days     senna 8.6 MG tablet  Commonly known as:  SENOKOT  Take 2 tablets by mouth daily as needed for constipation.     sulfamethoxazole-trimethoprim 800-160 MG tablet  Commonly known as:  BACTRIM DS,SEPTRA DS  Take 1 tablet by mouth 2 (two) times daily. Take for 7 days     Vitamin D2 2000 units Tabs  Take 1 tablet by mouth daily.     warfarin 3 MG tablet  Commonly known as:  COUMADIN  Take 3 mg by mouth one time only at 6 PM.         Allergies  Allergen Reactions  .  Nsaids     On coumadin  . Statins     Cause leg cramps  REVIEW OF SYSTEMS:  GENERAL: no change in appetite, no fatigue, no weight changes, no fever, chills or weakness EYES: Denies change in vision, dry eyes, eye pain, itching or discharge EARS: Denies change in hearing, ringing in ears, or earache NOSE: Denies nasal congestion or epistaxis MOUTH and THROAT: Denies oral discomfort, gingival pain or bleeding, pain from teeth or hoarseness   RESPIRATORY: no cough, SOB, DOE, wheezing, hemoptysis CARDIAC: no chest pain, or palpitations, +edema GI: no abdominal pain, diarrhea, constipation, heart burn, nausea or vomiting GU: Denies dysuria, frequency, hematuria, incontinence, or discharge PSYCHIATRIC: Denies feeling of depression or anxiety. No report of hallucinations, insomnia, paranoia, or agitation    PHYSICAL EXAMINATION  GENERAL APPEARANCE: Well nourished. In no acute distress. Obese SKIN:  Midline upper back surgical incision is dry, no erythema HEAD: Normal in size and contour. No evidence of trauma EYES: Lids open and close normally. No blepharitis, entropion or ectropion. PERRL. Conjunctivae are clear and sclerae are white. Lenses are without opacity EARS: Pinnae are normal. Patient hears normal voice tunes of the examiner MOUTH and THROAT: Lips are without lesions. Oral mucosa is moist and without lesions. Tongue is normal in shape, size, and color and without lesions NECK: supple, trachea midline, no neck masses, no thyroid tenderness, no thyromegaly LYMPHATICS: no LAN in the neck, no supraclavicular LAN RESPIRATORY: breathing is even & unlabored, BS CTAB CARDIAC: Irregularly irregular, no murmur,no extra heart sounds, BLE edema 2+ GI: abdomen soft, normal BS, no masses, no tenderness, no hepatomegaly, no splenomegaly EXTREMITIES:  Able to move X 4 extremities PSYCHIATRIC: Alert and oriented X 3. Affect and behavior are appropriate  LABS/RADIOLOGY: Labs  reviewed: Basic Metabolic Panel:  Recent Labs  09/16/15 0433 09/19/15 0523  09/20/15 0546  10/20/15 10/27/15 10/31/15  NA 140 140  < > 138  < > 144 144 143  K 4.8 4.2  --  5.0  < > 3.3* 4.0 3.9  CL 102 106  --  106  --   --   --   --   CO2 28 27  --  24  --   --   --   --   GLUCOSE 153* 138*  --  131*  --   --   --   --   BUN 49* 31*  < > 23*  < > 21 23* 22*  CREATININE 1.61* 1.05*  < > 0.84  < > 0.8 1.0 0.9  CALCIUM 8.6* 8.3*  --  8.0*  --   --   --   --   < > = values in this interval not displayed. Liver Function Tests:  Recent Labs  09/15/15 1638 09/16/15 0433 09/29/15  AST 27 23 17   ALT 19 17 13   ALKPHOS 54 52 90  BILITOT 2.4* 2.5*  --   PROT 6.4* 6.2*  --   ALBUMIN 3.2* 3.0*  --     CBC:  Recent Labs  02/27/15 1615 09/15/15 1638 09/16/15 0433 09/19/15 0523 09/20/15 09/20/15 0546 09/29/15  WBC 7.9 10.3 9.8 8.9 8.8 8.8 8.3  NEUTROABS 5.3 7.6  --   --   --   --  5  HGB 14.3 10.3* 10.0* 10.0*  --  9.2* 9.3*  HCT 44.2 31.2* 31.8* 31.9*  --  28.3* 31*  MCV 95.3 92.6 97.2 94.9  --  95.0  --   PLT 205 303 309 296  --  278 423*   Cardiac Enzymes:  Recent Labs  09/22/15 1346 09/22/15  1840 09/23/15 0025  TROPONINI 0.03 <0.03 <0.03   CBG:  Recent Labs  09/22/15 2208 09/23/15 0847 09/23/15 1114  GLUCAP 141* 114* 154*      ASSESSMENT/PLAN:  Lower extremity weakness - for Home health PT, OT, CNA and skilled Nurse  Thoracic stenosis with myelopathy S/P Laminectomy and decompression -  continue Tylenol 325 mg take 2 tabs Q 4 hours PRN; cyclobenzaprine 5 mg 1 tab by mouth twice a day when necessary for muscle spasm  UTI - continue Bactrim DS 1 tab BID to complete a total of 7 days and Florastor 250 mg 1 capsule BID to complete a total of 10 days  GERD - continue ranitidine 75 mg 1 tab by mouth daily when necessary  Constipation - continue MiraLAX 17 g by mouth dailyPRN and Senokot 2 tabs by mouth Q D PRN  Hypertension - well-controlled; continue  Cozaar 100 mg 1 tab by mouth daily and Bisoprolol-HCTZ 10-6.25 mg BID  Hyperlipidemia - continue Crestor 10 mg 1 tab by mouth daily at bedtime  OAB - continue oxybutynin 5 mg 1 tab by mouth twice a day  BLE Edema - recently decreased Lasix to 20 mg daily  Chronic atrial fibrillation - rate controlled; continue Coumadin and Bisoprolol-HCTZ  Hypokalemia - K 3.9; continue KCL 20 meq 1 tab PO Q M-W-F  Protein calorie malnutrition - continue Procel 2 scoops by mouth twice a day     I have filled out patient's discharge paperwork and written prescriptions.  Patient will receive home health PT, OT, skilled Nurse and CNA.  DME provided:  20" X 18" wheelchair (breezy 510), elevating leg rests, anti-tippers, break extenders, basic cushion and removable arm rests  Total discharge time: Greater than 30 minutes  Discharge time involved coordination of the discharge process with Education officer, museum, nursing staff and therapy department. Medical justification for home health services/DME verified.     Durenda Age, NP [employer] [phone]

## 2015-11-09 DIAGNOSIS — I10 Essential (primary) hypertension: Secondary | ICD-10-CM | POA: Diagnosis not present

## 2015-11-09 DIAGNOSIS — E43 Unspecified severe protein-calorie malnutrition: Secondary | ICD-10-CM | POA: Diagnosis not present

## 2015-11-09 DIAGNOSIS — M1990 Unspecified osteoarthritis, unspecified site: Secondary | ICD-10-CM | POA: Diagnosis not present

## 2015-11-09 DIAGNOSIS — M4804 Spinal stenosis, thoracic region: Secondary | ICD-10-CM | POA: Diagnosis not present

## 2015-11-09 DIAGNOSIS — Z4789 Encounter for other orthopedic aftercare: Secondary | ICD-10-CM | POA: Diagnosis not present

## 2015-11-09 DIAGNOSIS — I482 Chronic atrial fibrillation: Secondary | ICD-10-CM | POA: Diagnosis not present

## 2015-11-10 ENCOUNTER — Other Ambulatory Visit: Payer: Self-pay | Admitting: *Deleted

## 2015-11-10 DIAGNOSIS — M4804 Spinal stenosis, thoracic region: Secondary | ICD-10-CM | POA: Diagnosis not present

## 2015-11-10 DIAGNOSIS — E43 Unspecified severe protein-calorie malnutrition: Secondary | ICD-10-CM | POA: Diagnosis not present

## 2015-11-10 DIAGNOSIS — I482 Chronic atrial fibrillation: Secondary | ICD-10-CM | POA: Diagnosis not present

## 2015-11-10 DIAGNOSIS — I10 Essential (primary) hypertension: Secondary | ICD-10-CM | POA: Diagnosis not present

## 2015-11-10 DIAGNOSIS — Z4789 Encounter for other orthopedic aftercare: Secondary | ICD-10-CM | POA: Diagnosis not present

## 2015-11-10 DIAGNOSIS — M1990 Unspecified osteoarthritis, unspecified site: Secondary | ICD-10-CM | POA: Diagnosis not present

## 2015-11-10 NOTE — Patient Outreach (Signed)
[place] [facility]) Care Management  11/10/2015  [name] [name] Oct 14, 1933 [id number]   Assessment: Transition of care- initial call Call placed and spoke with patient briefly. She reports "doing okay" and just discharged from [place] facility last Saturday 5/13. Care management coordinator introduced self and explained purpose of the call.  Phone conversation ended early when patient reports that her son just got in and that she needed to speak to him. Patient requested care management coordinator to call back later.  Plan: Will attempt to get back with patient today, otherwise, will call her tomorrow to follow-up.    Called patient back today and was able to speak to her briefly. She was able to provide limited information at this time.  Patient reports that her sister [name] [name]) is staying with her for couple of days. [facility] home health nurse had seen her yesterday and physical therapist saw her today. Home health aide will be coming on Wednesday 5/17 as stated.  She reports managing her own medications and has all her medication supplies.  She ambulates using a walker, prepares her food but does not cook meals. She is able to dress herself and uses the toilet on her own. Unable to review medications with patient and conversation was cut short since she had company that arrived and not able to talk on the phone as stated. Patient states to call her back tomorrow.   Plan: Will call again to follow-up with patient tomorrow as requested.  Leiann Sporer A. Carmelina Balducci, BSN, RN-BC [place] Management Coordinator Cell: [phone]

## 2015-11-11 ENCOUNTER — Other Ambulatory Visit: Payer: Self-pay | Admitting: *Deleted

## 2015-11-11 ENCOUNTER — Encounter: Payer: Self-pay | Admitting: *Deleted

## 2015-11-11 ENCOUNTER — Ambulatory Visit: Payer: PRIVATE HEALTH INSURANCE | Admitting: *Deleted

## 2015-11-11 DIAGNOSIS — M4804 Spinal stenosis, thoracic region: Secondary | ICD-10-CM | POA: Diagnosis not present

## 2015-11-11 DIAGNOSIS — E43 Unspecified severe protein-calorie malnutrition: Secondary | ICD-10-CM | POA: Diagnosis not present

## 2015-11-11 DIAGNOSIS — M1990 Unspecified osteoarthritis, unspecified site: Secondary | ICD-10-CM | POA: Diagnosis not present

## 2015-11-11 DIAGNOSIS — Z4789 Encounter for other orthopedic aftercare: Secondary | ICD-10-CM | POA: Diagnosis not present

## 2015-11-11 DIAGNOSIS — I482 Chronic atrial fibrillation: Secondary | ICD-10-CM | POA: Diagnosis not present

## 2015-11-11 DIAGNOSIS — I10 Essential (primary) hypertension: Secondary | ICD-10-CM | POA: Diagnosis not present

## 2015-11-11 NOTE — Patient Outreach (Signed)
[place] [facility]) Care Management  11/11/2015  Ramia Justyna Harkins [birth year] [id number]   Assessment: Transition of care follow-up call/ Case Closure Call placed and spoke with patient stating "having a good day". "Not having any pain for weeks and weeks now",as stated. She reports using only Tylenol for aches and pains, "not anything stronger than that" as verbalized. Patient mentioned that she had completed Bactrim last 5/12 for urinary tract infection. She states taking her medications as ordered.  According to patient, her scheduled follow-up visit with primary care provider (Dr. Rex Kras) is on 11/19/15. Transportation to her appointments is being provided by her daughter-in -Sports coach. She shares that her son and daughter-in-law lives close by (about 10 minutes away from her) and able to provide assistance when needed. [place] home health services (nurse, physical therapy and aide) are in place.  Patient denies any more needs identified at this moment. She reports having everything that she needs for her care at this time. She states "no need for additional services at present". Patient pleasantly declined [facility] services for now. She is aware of her eligibility to the program but chose not to participate.  Patient agreed to case closure and to notify primary care provider of such.  Patient was encouraged to call [facility] management coordinator or 24 hour nurse call line if needed for further or future needs that may arise. Patient agreed.  Contact information was provided to patient.    Plan: Will close case since patient voluntarily withdrew from the program and chose not to participate at this time. Will notify primary care provider of case closure.   Melven Stockard A. Azizah Lisle, BSN, RN-BC [place] Management Coordinator Cell: [phone]

## 2015-11-13 DIAGNOSIS — Z4789 Encounter for other orthopedic aftercare: Secondary | ICD-10-CM | POA: Diagnosis not present

## 2015-11-13 DIAGNOSIS — I482 Chronic atrial fibrillation: Secondary | ICD-10-CM | POA: Diagnosis not present

## 2015-11-13 DIAGNOSIS — M4804 Spinal stenosis, thoracic region: Secondary | ICD-10-CM | POA: Diagnosis not present

## 2015-11-13 DIAGNOSIS — M1990 Unspecified osteoarthritis, unspecified site: Secondary | ICD-10-CM | POA: Diagnosis not present

## 2015-11-13 DIAGNOSIS — I10 Essential (primary) hypertension: Secondary | ICD-10-CM | POA: Diagnosis not present

## 2015-11-13 DIAGNOSIS — E43 Unspecified severe protein-calorie malnutrition: Secondary | ICD-10-CM | POA: Diagnosis not present

## 2015-11-16 DIAGNOSIS — M4804 Spinal stenosis, thoracic region: Secondary | ICD-10-CM | POA: Diagnosis not present

## 2015-11-16 DIAGNOSIS — I10 Essential (primary) hypertension: Secondary | ICD-10-CM | POA: Diagnosis not present

## 2015-11-16 DIAGNOSIS — I482 Chronic atrial fibrillation: Secondary | ICD-10-CM | POA: Diagnosis not present

## 2015-11-16 DIAGNOSIS — E43 Unspecified severe protein-calorie malnutrition: Secondary | ICD-10-CM | POA: Diagnosis not present

## 2015-11-16 DIAGNOSIS — M1990 Unspecified osteoarthritis, unspecified site: Secondary | ICD-10-CM | POA: Diagnosis not present

## 2015-11-16 DIAGNOSIS — Z4789 Encounter for other orthopedic aftercare: Secondary | ICD-10-CM | POA: Diagnosis not present

## 2015-11-17 DIAGNOSIS — E43 Unspecified severe protein-calorie malnutrition: Secondary | ICD-10-CM | POA: Diagnosis not present

## 2015-11-17 DIAGNOSIS — I10 Essential (primary) hypertension: Secondary | ICD-10-CM | POA: Diagnosis not present

## 2015-11-17 DIAGNOSIS — M1990 Unspecified osteoarthritis, unspecified site: Secondary | ICD-10-CM | POA: Diagnosis not present

## 2015-11-17 DIAGNOSIS — M4804 Spinal stenosis, thoracic region: Secondary | ICD-10-CM | POA: Diagnosis not present

## 2015-11-17 DIAGNOSIS — I482 Chronic atrial fibrillation: Secondary | ICD-10-CM | POA: Diagnosis not present

## 2015-11-17 DIAGNOSIS — Z4789 Encounter for other orthopedic aftercare: Secondary | ICD-10-CM | POA: Diagnosis not present

## 2015-11-18 DIAGNOSIS — I482 Chronic atrial fibrillation: Secondary | ICD-10-CM | POA: Diagnosis not present

## 2015-11-18 DIAGNOSIS — I10 Essential (primary) hypertension: Secondary | ICD-10-CM | POA: Diagnosis not present

## 2015-11-18 DIAGNOSIS — Z4789 Encounter for other orthopedic aftercare: Secondary | ICD-10-CM | POA: Diagnosis not present

## 2015-11-18 DIAGNOSIS — M4804 Spinal stenosis, thoracic region: Secondary | ICD-10-CM | POA: Diagnosis not present

## 2015-11-18 DIAGNOSIS — E43 Unspecified severe protein-calorie malnutrition: Secondary | ICD-10-CM | POA: Diagnosis not present

## 2015-11-18 DIAGNOSIS — M1990 Unspecified osteoarthritis, unspecified site: Secondary | ICD-10-CM | POA: Diagnosis not present

## 2015-11-19 DIAGNOSIS — E1121 Type 2 diabetes mellitus with diabetic nephropathy: Secondary | ICD-10-CM | POA: Diagnosis not present

## 2015-11-19 DIAGNOSIS — I4891 Unspecified atrial fibrillation: Secondary | ICD-10-CM | POA: Diagnosis not present

## 2015-11-19 DIAGNOSIS — R350 Frequency of micturition: Secondary | ICD-10-CM | POA: Diagnosis not present

## 2015-11-21 DIAGNOSIS — E43 Unspecified severe protein-calorie malnutrition: Secondary | ICD-10-CM | POA: Diagnosis not present

## 2015-11-21 DIAGNOSIS — I10 Essential (primary) hypertension: Secondary | ICD-10-CM | POA: Diagnosis not present

## 2015-11-21 DIAGNOSIS — M1990 Unspecified osteoarthritis, unspecified site: Secondary | ICD-10-CM | POA: Diagnosis not present

## 2015-11-21 DIAGNOSIS — M4804 Spinal stenosis, thoracic region: Secondary | ICD-10-CM | POA: Diagnosis not present

## 2015-11-21 DIAGNOSIS — I482 Chronic atrial fibrillation: Secondary | ICD-10-CM | POA: Diagnosis not present

## 2015-11-21 DIAGNOSIS — Z4789 Encounter for other orthopedic aftercare: Secondary | ICD-10-CM | POA: Diagnosis not present

## 2015-11-25 DIAGNOSIS — E43 Unspecified severe protein-calorie malnutrition: Secondary | ICD-10-CM | POA: Diagnosis not present

## 2015-11-25 DIAGNOSIS — Z4789 Encounter for other orthopedic aftercare: Secondary | ICD-10-CM | POA: Diagnosis not present

## 2015-11-25 DIAGNOSIS — I482 Chronic atrial fibrillation: Secondary | ICD-10-CM | POA: Diagnosis not present

## 2015-11-25 DIAGNOSIS — M4804 Spinal stenosis, thoracic region: Secondary | ICD-10-CM | POA: Diagnosis not present

## 2015-11-25 DIAGNOSIS — I10 Essential (primary) hypertension: Secondary | ICD-10-CM | POA: Diagnosis not present

## 2015-11-25 DIAGNOSIS — M1990 Unspecified osteoarthritis, unspecified site: Secondary | ICD-10-CM | POA: Diagnosis not present

## 2015-11-26 DIAGNOSIS — I10 Essential (primary) hypertension: Secondary | ICD-10-CM | POA: Diagnosis not present

## 2015-11-26 DIAGNOSIS — I482 Chronic atrial fibrillation: Secondary | ICD-10-CM | POA: Diagnosis not present

## 2015-11-26 DIAGNOSIS — M4804 Spinal stenosis, thoracic region: Secondary | ICD-10-CM | POA: Diagnosis not present

## 2015-11-26 DIAGNOSIS — Z4789 Encounter for other orthopedic aftercare: Secondary | ICD-10-CM | POA: Diagnosis not present

## 2015-11-26 DIAGNOSIS — E43 Unspecified severe protein-calorie malnutrition: Secondary | ICD-10-CM | POA: Diagnosis not present

## 2015-11-26 DIAGNOSIS — M1990 Unspecified osteoarthritis, unspecified site: Secondary | ICD-10-CM | POA: Diagnosis not present

## 2015-11-27 DIAGNOSIS — E43 Unspecified severe protein-calorie malnutrition: Secondary | ICD-10-CM | POA: Diagnosis not present

## 2015-11-27 DIAGNOSIS — Z4789 Encounter for other orthopedic aftercare: Secondary | ICD-10-CM | POA: Diagnosis not present

## 2015-11-27 DIAGNOSIS — M1990 Unspecified osteoarthritis, unspecified site: Secondary | ICD-10-CM | POA: Diagnosis not present

## 2015-11-27 DIAGNOSIS — I10 Essential (primary) hypertension: Secondary | ICD-10-CM | POA: Diagnosis not present

## 2015-11-27 DIAGNOSIS — I482 Chronic atrial fibrillation: Secondary | ICD-10-CM | POA: Diagnosis not present

## 2015-11-27 DIAGNOSIS — M4804 Spinal stenosis, thoracic region: Secondary | ICD-10-CM | POA: Diagnosis not present

## 2015-11-28 DIAGNOSIS — Z4789 Encounter for other orthopedic aftercare: Secondary | ICD-10-CM | POA: Diagnosis not present

## 2015-11-28 DIAGNOSIS — M4804 Spinal stenosis, thoracic region: Secondary | ICD-10-CM | POA: Diagnosis not present

## 2015-11-28 DIAGNOSIS — I10 Essential (primary) hypertension: Secondary | ICD-10-CM | POA: Diagnosis not present

## 2015-11-28 DIAGNOSIS — E43 Unspecified severe protein-calorie malnutrition: Secondary | ICD-10-CM | POA: Diagnosis not present

## 2015-11-28 DIAGNOSIS — I482 Chronic atrial fibrillation: Secondary | ICD-10-CM | POA: Diagnosis not present

## 2015-11-28 DIAGNOSIS — M1990 Unspecified osteoarthritis, unspecified site: Secondary | ICD-10-CM | POA: Diagnosis not present

## 2015-12-01 DIAGNOSIS — M1990 Unspecified osteoarthritis, unspecified site: Secondary | ICD-10-CM | POA: Diagnosis not present

## 2015-12-01 DIAGNOSIS — Z4789 Encounter for other orthopedic aftercare: Secondary | ICD-10-CM | POA: Diagnosis not present

## 2015-12-01 DIAGNOSIS — E43 Unspecified severe protein-calorie malnutrition: Secondary | ICD-10-CM | POA: Diagnosis not present

## 2015-12-01 DIAGNOSIS — I10 Essential (primary) hypertension: Secondary | ICD-10-CM | POA: Diagnosis not present

## 2015-12-01 DIAGNOSIS — M4804 Spinal stenosis, thoracic region: Secondary | ICD-10-CM | POA: Diagnosis not present

## 2015-12-01 DIAGNOSIS — I482 Chronic atrial fibrillation: Secondary | ICD-10-CM | POA: Diagnosis not present

## 2015-12-02 DIAGNOSIS — I482 Chronic atrial fibrillation: Secondary | ICD-10-CM | POA: Diagnosis not present

## 2015-12-02 DIAGNOSIS — M1990 Unspecified osteoarthritis, unspecified site: Secondary | ICD-10-CM | POA: Diagnosis not present

## 2015-12-02 DIAGNOSIS — Z4789 Encounter for other orthopedic aftercare: Secondary | ICD-10-CM | POA: Diagnosis not present

## 2015-12-02 DIAGNOSIS — E43 Unspecified severe protein-calorie malnutrition: Secondary | ICD-10-CM | POA: Diagnosis not present

## 2015-12-02 DIAGNOSIS — M4804 Spinal stenosis, thoracic region: Secondary | ICD-10-CM | POA: Diagnosis not present

## 2015-12-02 DIAGNOSIS — I10 Essential (primary) hypertension: Secondary | ICD-10-CM | POA: Diagnosis not present

## 2015-12-03 DIAGNOSIS — I4891 Unspecified atrial fibrillation: Secondary | ICD-10-CM | POA: Diagnosis not present

## 2015-12-03 DIAGNOSIS — Z7901 Long term (current) use of anticoagulants: Secondary | ICD-10-CM | POA: Diagnosis not present

## 2015-12-04 DIAGNOSIS — I10 Essential (primary) hypertension: Secondary | ICD-10-CM | POA: Diagnosis not present

## 2015-12-04 DIAGNOSIS — E43 Unspecified severe protein-calorie malnutrition: Secondary | ICD-10-CM | POA: Diagnosis not present

## 2015-12-04 DIAGNOSIS — M1990 Unspecified osteoarthritis, unspecified site: Secondary | ICD-10-CM | POA: Diagnosis not present

## 2015-12-04 DIAGNOSIS — Z4789 Encounter for other orthopedic aftercare: Secondary | ICD-10-CM | POA: Diagnosis not present

## 2015-12-04 DIAGNOSIS — M4804 Spinal stenosis, thoracic region: Secondary | ICD-10-CM | POA: Diagnosis not present

## 2015-12-04 DIAGNOSIS — I482 Chronic atrial fibrillation: Secondary | ICD-10-CM | POA: Diagnosis not present

## 2015-12-05 DIAGNOSIS — I10 Essential (primary) hypertension: Secondary | ICD-10-CM | POA: Diagnosis not present

## 2015-12-05 DIAGNOSIS — Z4789 Encounter for other orthopedic aftercare: Secondary | ICD-10-CM | POA: Diagnosis not present

## 2015-12-05 DIAGNOSIS — M1990 Unspecified osteoarthritis, unspecified site: Secondary | ICD-10-CM | POA: Diagnosis not present

## 2015-12-05 DIAGNOSIS — M4804 Spinal stenosis, thoracic region: Secondary | ICD-10-CM | POA: Diagnosis not present

## 2015-12-05 DIAGNOSIS — E43 Unspecified severe protein-calorie malnutrition: Secondary | ICD-10-CM | POA: Diagnosis not present

## 2015-12-05 DIAGNOSIS — I482 Chronic atrial fibrillation: Secondary | ICD-10-CM | POA: Diagnosis not present

## 2015-12-08 DIAGNOSIS — M4804 Spinal stenosis, thoracic region: Secondary | ICD-10-CM | POA: Diagnosis not present

## 2015-12-08 DIAGNOSIS — I10 Essential (primary) hypertension: Secondary | ICD-10-CM | POA: Diagnosis not present

## 2015-12-08 DIAGNOSIS — Z4789 Encounter for other orthopedic aftercare: Secondary | ICD-10-CM | POA: Diagnosis not present

## 2015-12-08 DIAGNOSIS — I482 Chronic atrial fibrillation: Secondary | ICD-10-CM | POA: Diagnosis not present

## 2015-12-08 DIAGNOSIS — E43 Unspecified severe protein-calorie malnutrition: Secondary | ICD-10-CM | POA: Diagnosis not present

## 2015-12-08 DIAGNOSIS — M1990 Unspecified osteoarthritis, unspecified site: Secondary | ICD-10-CM | POA: Diagnosis not present

## 2015-12-11 DIAGNOSIS — M4804 Spinal stenosis, thoracic region: Secondary | ICD-10-CM | POA: Diagnosis not present

## 2015-12-11 DIAGNOSIS — E43 Unspecified severe protein-calorie malnutrition: Secondary | ICD-10-CM | POA: Diagnosis not present

## 2015-12-11 DIAGNOSIS — Z4789 Encounter for other orthopedic aftercare: Secondary | ICD-10-CM | POA: Diagnosis not present

## 2015-12-11 DIAGNOSIS — I10 Essential (primary) hypertension: Secondary | ICD-10-CM | POA: Diagnosis not present

## 2015-12-11 DIAGNOSIS — I482 Chronic atrial fibrillation: Secondary | ICD-10-CM | POA: Diagnosis not present

## 2015-12-11 DIAGNOSIS — M1990 Unspecified osteoarthritis, unspecified site: Secondary | ICD-10-CM | POA: Diagnosis not present

## 2015-12-16 DIAGNOSIS — E43 Unspecified severe protein-calorie malnutrition: Secondary | ICD-10-CM | POA: Diagnosis not present

## 2015-12-16 DIAGNOSIS — I10 Essential (primary) hypertension: Secondary | ICD-10-CM | POA: Diagnosis not present

## 2015-12-16 DIAGNOSIS — M1990 Unspecified osteoarthritis, unspecified site: Secondary | ICD-10-CM | POA: Diagnosis not present

## 2015-12-16 DIAGNOSIS — M4804 Spinal stenosis, thoracic region: Secondary | ICD-10-CM | POA: Diagnosis not present

## 2015-12-16 DIAGNOSIS — I482 Chronic atrial fibrillation: Secondary | ICD-10-CM | POA: Diagnosis not present

## 2015-12-16 DIAGNOSIS — Z4789 Encounter for other orthopedic aftercare: Secondary | ICD-10-CM | POA: Diagnosis not present

## 2015-12-18 DIAGNOSIS — I482 Chronic atrial fibrillation: Secondary | ICD-10-CM | POA: Diagnosis not present

## 2015-12-18 DIAGNOSIS — E43 Unspecified severe protein-calorie malnutrition: Secondary | ICD-10-CM | POA: Diagnosis not present

## 2015-12-18 DIAGNOSIS — I10 Essential (primary) hypertension: Secondary | ICD-10-CM | POA: Diagnosis not present

## 2015-12-18 DIAGNOSIS — Z4789 Encounter for other orthopedic aftercare: Secondary | ICD-10-CM | POA: Diagnosis not present

## 2015-12-18 DIAGNOSIS — M4804 Spinal stenosis, thoracic region: Secondary | ICD-10-CM | POA: Diagnosis not present

## 2015-12-18 DIAGNOSIS — M1990 Unspecified osteoarthritis, unspecified site: Secondary | ICD-10-CM | POA: Diagnosis not present

## 2015-12-23 DIAGNOSIS — E43 Unspecified severe protein-calorie malnutrition: Secondary | ICD-10-CM | POA: Diagnosis not present

## 2015-12-23 DIAGNOSIS — I482 Chronic atrial fibrillation: Secondary | ICD-10-CM | POA: Diagnosis not present

## 2015-12-23 DIAGNOSIS — Z4789 Encounter for other orthopedic aftercare: Secondary | ICD-10-CM | POA: Diagnosis not present

## 2015-12-23 DIAGNOSIS — M1990 Unspecified osteoarthritis, unspecified site: Secondary | ICD-10-CM | POA: Diagnosis not present

## 2015-12-23 DIAGNOSIS — M4804 Spinal stenosis, thoracic region: Secondary | ICD-10-CM | POA: Diagnosis not present

## 2015-12-23 DIAGNOSIS — I10 Essential (primary) hypertension: Secondary | ICD-10-CM | POA: Diagnosis not present

## 2015-12-25 DIAGNOSIS — M4804 Spinal stenosis, thoracic region: Secondary | ICD-10-CM | POA: Diagnosis not present

## 2015-12-25 DIAGNOSIS — I10 Essential (primary) hypertension: Secondary | ICD-10-CM | POA: Diagnosis not present

## 2015-12-25 DIAGNOSIS — Z4789 Encounter for other orthopedic aftercare: Secondary | ICD-10-CM | POA: Diagnosis not present

## 2015-12-25 DIAGNOSIS — I482 Chronic atrial fibrillation: Secondary | ICD-10-CM | POA: Diagnosis not present

## 2015-12-25 DIAGNOSIS — M1990 Unspecified osteoarthritis, unspecified site: Secondary | ICD-10-CM | POA: Diagnosis not present

## 2015-12-25 DIAGNOSIS — E43 Unspecified severe protein-calorie malnutrition: Secondary | ICD-10-CM | POA: Diagnosis not present

## 2015-12-26 DIAGNOSIS — Z7901 Long term (current) use of anticoagulants: Secondary | ICD-10-CM | POA: Diagnosis not present

## 2015-12-29 DIAGNOSIS — E43 Unspecified severe protein-calorie malnutrition: Secondary | ICD-10-CM | POA: Diagnosis not present

## 2015-12-29 DIAGNOSIS — I482 Chronic atrial fibrillation: Secondary | ICD-10-CM | POA: Diagnosis not present

## 2015-12-29 DIAGNOSIS — Z4789 Encounter for other orthopedic aftercare: Secondary | ICD-10-CM | POA: Diagnosis not present

## 2015-12-29 DIAGNOSIS — M1990 Unspecified osteoarthritis, unspecified site: Secondary | ICD-10-CM | POA: Diagnosis not present

## 2015-12-29 DIAGNOSIS — M4804 Spinal stenosis, thoracic region: Secondary | ICD-10-CM | POA: Diagnosis not present

## 2015-12-29 DIAGNOSIS — I10 Essential (primary) hypertension: Secondary | ICD-10-CM | POA: Diagnosis not present

## 2015-12-31 DIAGNOSIS — M1990 Unspecified osteoarthritis, unspecified site: Secondary | ICD-10-CM | POA: Diagnosis not present

## 2015-12-31 DIAGNOSIS — I482 Chronic atrial fibrillation: Secondary | ICD-10-CM | POA: Diagnosis not present

## 2015-12-31 DIAGNOSIS — M4804 Spinal stenosis, thoracic region: Secondary | ICD-10-CM | POA: Diagnosis not present

## 2015-12-31 DIAGNOSIS — Z4789 Encounter for other orthopedic aftercare: Secondary | ICD-10-CM | POA: Diagnosis not present

## 2015-12-31 DIAGNOSIS — I10 Essential (primary) hypertension: Secondary | ICD-10-CM | POA: Diagnosis not present

## 2015-12-31 DIAGNOSIS — E43 Unspecified severe protein-calorie malnutrition: Secondary | ICD-10-CM | POA: Diagnosis not present

## 2016-01-05 DIAGNOSIS — E43 Unspecified severe protein-calorie malnutrition: Secondary | ICD-10-CM | POA: Diagnosis not present

## 2016-01-05 DIAGNOSIS — M1990 Unspecified osteoarthritis, unspecified site: Secondary | ICD-10-CM | POA: Diagnosis not present

## 2016-01-05 DIAGNOSIS — M4804 Spinal stenosis, thoracic region: Secondary | ICD-10-CM | POA: Diagnosis not present

## 2016-01-05 DIAGNOSIS — Z4789 Encounter for other orthopedic aftercare: Secondary | ICD-10-CM | POA: Diagnosis not present

## 2016-01-05 DIAGNOSIS — I10 Essential (primary) hypertension: Secondary | ICD-10-CM | POA: Diagnosis not present

## 2016-01-05 DIAGNOSIS — I482 Chronic atrial fibrillation: Secondary | ICD-10-CM | POA: Diagnosis not present

## 2016-01-07 DIAGNOSIS — M4804 Spinal stenosis, thoracic region: Secondary | ICD-10-CM | POA: Diagnosis not present

## 2016-01-07 DIAGNOSIS — M1990 Unspecified osteoarthritis, unspecified site: Secondary | ICD-10-CM | POA: Diagnosis not present

## 2016-01-07 DIAGNOSIS — E43 Unspecified severe protein-calorie malnutrition: Secondary | ICD-10-CM | POA: Diagnosis not present

## 2016-01-07 DIAGNOSIS — Z4789 Encounter for other orthopedic aftercare: Secondary | ICD-10-CM | POA: Diagnosis not present

## 2016-01-07 DIAGNOSIS — I482 Chronic atrial fibrillation: Secondary | ICD-10-CM | POA: Diagnosis not present

## 2016-01-07 DIAGNOSIS — I10 Essential (primary) hypertension: Secondary | ICD-10-CM | POA: Diagnosis not present

## 2016-01-30 DIAGNOSIS — N8111 Cystocele, midline: Secondary | ICD-10-CM | POA: Diagnosis not present

## 2016-01-30 DIAGNOSIS — R32 Unspecified urinary incontinence: Secondary | ICD-10-CM | POA: Diagnosis not present

## 2016-02-05 DIAGNOSIS — Z7901 Long term (current) use of anticoagulants: Secondary | ICD-10-CM | POA: Diagnosis not present

## 2016-02-05 DIAGNOSIS — J3489 Other specified disorders of nose and nasal sinuses: Secondary | ICD-10-CM | POA: Diagnosis not present

## 2016-02-05 DIAGNOSIS — M4804 Spinal stenosis, thoracic region: Secondary | ICD-10-CM | POA: Diagnosis not present

## 2016-02-05 DIAGNOSIS — M79674 Pain in right toe(s): Secondary | ICD-10-CM | POA: Diagnosis not present

## 2016-02-13 DIAGNOSIS — Z23 Encounter for immunization: Secondary | ICD-10-CM | POA: Diagnosis not present

## 2016-02-13 DIAGNOSIS — A4902 Methicillin resistant Staphylococcus aureus infection, unspecified site: Secondary | ICD-10-CM | POA: Diagnosis not present

## 2016-02-16 DIAGNOSIS — Z7901 Long term (current) use of anticoagulants: Secondary | ICD-10-CM | POA: Diagnosis not present

## 2016-02-16 DIAGNOSIS — I4891 Unspecified atrial fibrillation: Secondary | ICD-10-CM | POA: Diagnosis not present

## 2016-02-19 DIAGNOSIS — Z7901 Long term (current) use of anticoagulants: Secondary | ICD-10-CM | POA: Diagnosis not present

## 2016-02-19 DIAGNOSIS — I4891 Unspecified atrial fibrillation: Secondary | ICD-10-CM | POA: Diagnosis not present

## 2016-02-23 ENCOUNTER — Ambulatory Visit: Payer: Medicare Other | Admitting: Podiatry

## 2016-02-27 DIAGNOSIS — I4891 Unspecified atrial fibrillation: Secondary | ICD-10-CM | POA: Diagnosis not present

## 2016-02-27 DIAGNOSIS — Z7901 Long term (current) use of anticoagulants: Secondary | ICD-10-CM | POA: Diagnosis not present

## 2016-03-10 DIAGNOSIS — R32 Unspecified urinary incontinence: Secondary | ICD-10-CM | POA: Diagnosis not present

## 2016-03-12 DIAGNOSIS — I4891 Unspecified atrial fibrillation: Secondary | ICD-10-CM | POA: Diagnosis not present

## 2016-03-12 DIAGNOSIS — Z7901 Long term (current) use of anticoagulants: Secondary | ICD-10-CM | POA: Diagnosis not present

## 2016-04-07 DIAGNOSIS — I4891 Unspecified atrial fibrillation: Secondary | ICD-10-CM | POA: Diagnosis not present

## 2016-04-07 DIAGNOSIS — Z7901 Long term (current) use of anticoagulants: Secondary | ICD-10-CM | POA: Diagnosis not present

## 2016-04-22 DIAGNOSIS — Z23 Encounter for immunization: Secondary | ICD-10-CM | POA: Diagnosis not present

## 2016-04-22 DIAGNOSIS — D485 Neoplasm of uncertain behavior of skin: Secondary | ICD-10-CM | POA: Diagnosis not present

## 2016-04-26 DIAGNOSIS — D225 Melanocytic nevi of trunk: Secondary | ICD-10-CM | POA: Diagnosis not present

## 2016-04-26 DIAGNOSIS — L821 Other seborrheic keratosis: Secondary | ICD-10-CM | POA: Diagnosis not present

## 2016-05-03 DIAGNOSIS — Z961 Presence of intraocular lens: Secondary | ICD-10-CM | POA: Diagnosis not present

## 2016-05-03 DIAGNOSIS — H40013 Open angle with borderline findings, low risk, bilateral: Secondary | ICD-10-CM | POA: Diagnosis not present

## 2016-05-03 DIAGNOSIS — H35033 Hypertensive retinopathy, bilateral: Secondary | ICD-10-CM | POA: Diagnosis not present

## 2016-05-03 DIAGNOSIS — H35372 Puckering of macula, left eye: Secondary | ICD-10-CM | POA: Diagnosis not present

## 2016-05-14 DIAGNOSIS — Z7901 Long term (current) use of anticoagulants: Secondary | ICD-10-CM | POA: Diagnosis not present

## 2016-05-14 DIAGNOSIS — I4891 Unspecified atrial fibrillation: Secondary | ICD-10-CM | POA: Diagnosis not present

## 2016-06-16 DIAGNOSIS — I4891 Unspecified atrial fibrillation: Secondary | ICD-10-CM | POA: Diagnosis not present

## 2016-06-16 DIAGNOSIS — Z7901 Long term (current) use of anticoagulants: Secondary | ICD-10-CM | POA: Diagnosis not present

## 2016-07-30 DIAGNOSIS — I4891 Unspecified atrial fibrillation: Secondary | ICD-10-CM | POA: Diagnosis not present

## 2016-07-30 DIAGNOSIS — Z7901 Long term (current) use of anticoagulants: Secondary | ICD-10-CM | POA: Diagnosis not present

## 2016-08-13 DIAGNOSIS — I4891 Unspecified atrial fibrillation: Secondary | ICD-10-CM | POA: Diagnosis not present

## 2016-08-13 DIAGNOSIS — Z7901 Long term (current) use of anticoagulants: Secondary | ICD-10-CM | POA: Diagnosis not present

## 2016-09-22 DIAGNOSIS — I4891 Unspecified atrial fibrillation: Secondary | ICD-10-CM | POA: Diagnosis not present

## 2016-09-22 DIAGNOSIS — Z7901 Long term (current) use of anticoagulants: Secondary | ICD-10-CM | POA: Diagnosis not present

## 2016-11-05 DIAGNOSIS — Z7901 Long term (current) use of anticoagulants: Secondary | ICD-10-CM | POA: Diagnosis not present

## 2016-11-05 DIAGNOSIS — I4891 Unspecified atrial fibrillation: Secondary | ICD-10-CM | POA: Diagnosis not present

## 2016-11-23 DIAGNOSIS — Z7901 Long term (current) use of anticoagulants: Secondary | ICD-10-CM | POA: Diagnosis not present

## 2016-12-24 DIAGNOSIS — Z7901 Long term (current) use of anticoagulants: Secondary | ICD-10-CM | POA: Diagnosis not present

## 2016-12-24 DIAGNOSIS — I4891 Unspecified atrial fibrillation: Secondary | ICD-10-CM | POA: Diagnosis not present

## 2017-02-16 DIAGNOSIS — Z7901 Long term (current) use of anticoagulants: Secondary | ICD-10-CM | POA: Diagnosis not present

## 2017-02-16 DIAGNOSIS — I4891 Unspecified atrial fibrillation: Secondary | ICD-10-CM | POA: Diagnosis not present

## 2017-03-15 DIAGNOSIS — N3281 Overactive bladder: Secondary | ICD-10-CM | POA: Diagnosis not present

## 2017-03-18 IMAGING — MR MR LUMBAR SPINE W/O CM
4 of 5 series · 17 of 48 positions shown · non-contrast
Comparison: Prior radiograph from earlier the same day.

CLINICAL DATA: Initial evaluation for right foot drop.

EXAM:
MRI LUMBAR SPINE WITHOUT CONTRAST
TECHNIQUE: Multiplanar, multisequence MR imaging of the lumbar spine was
performed. No intravenous contrast was administered.

[Series 3: T1 · sagittal · 4.0mm · 0.55mm/px · 3 of 18 slices shown (1 of 2)]
[im 4/18]
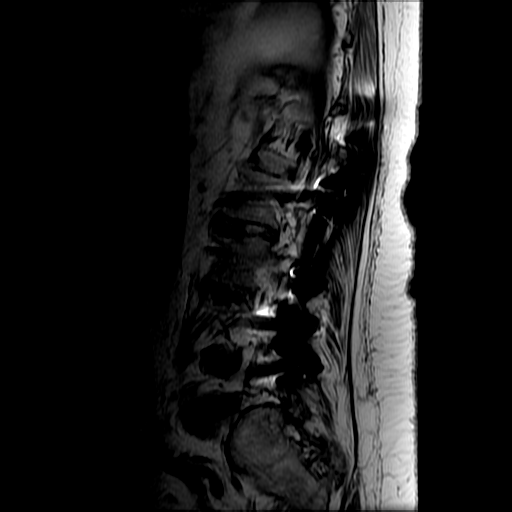
[im 11/18]
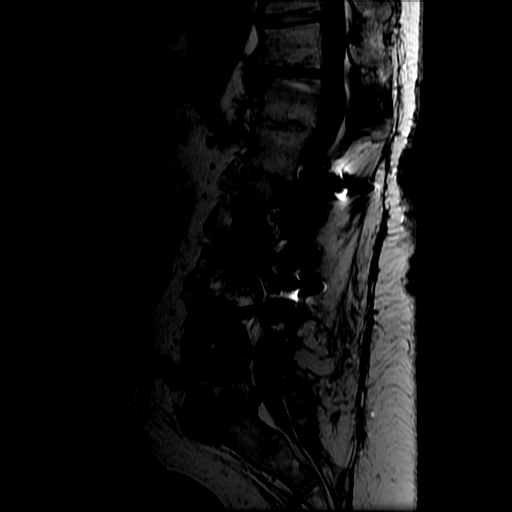
[im 18/18]
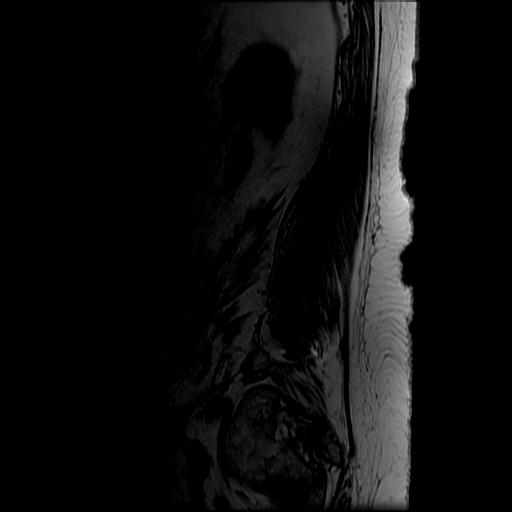

[Series 4: T2 post-contrast · sagittal · 4.0mm · 0.55mm/px · 6 of 18 slices shown]
[im 1/18]
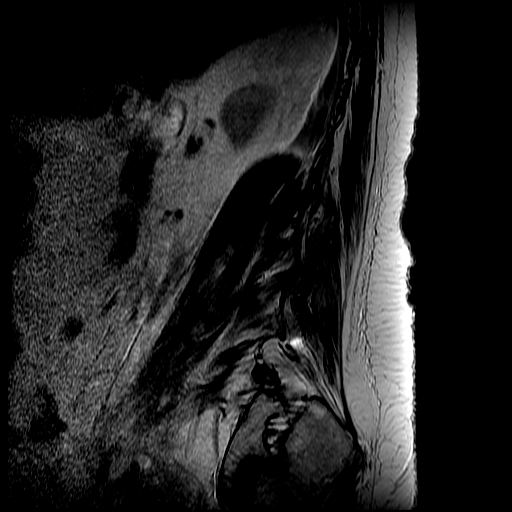
[im 4/18]
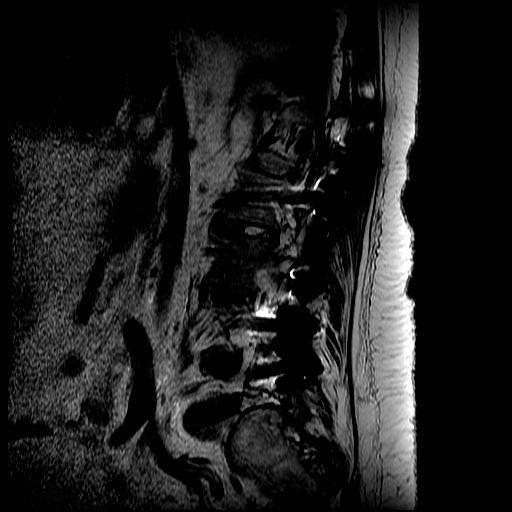
[im 7/18]
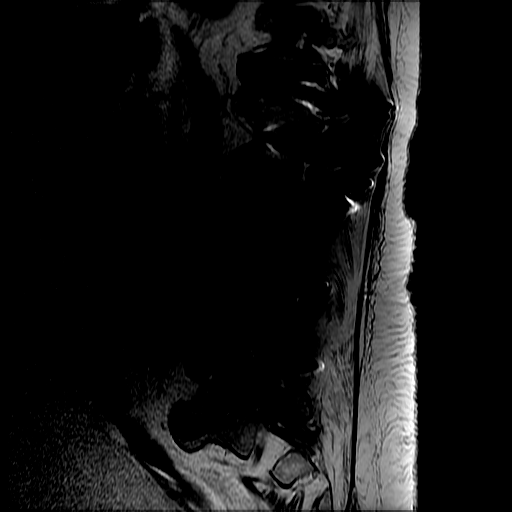
[im 11/18]
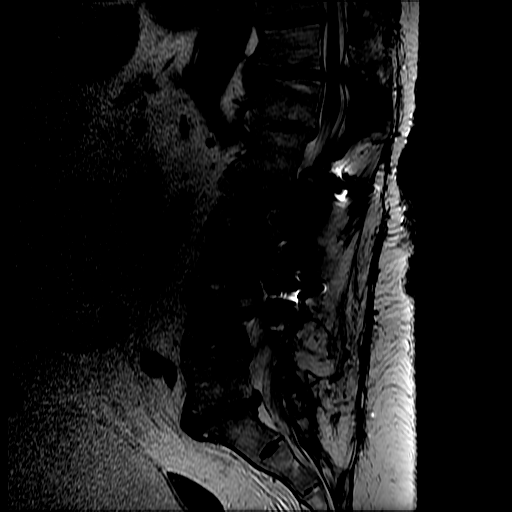
[im 14/18]
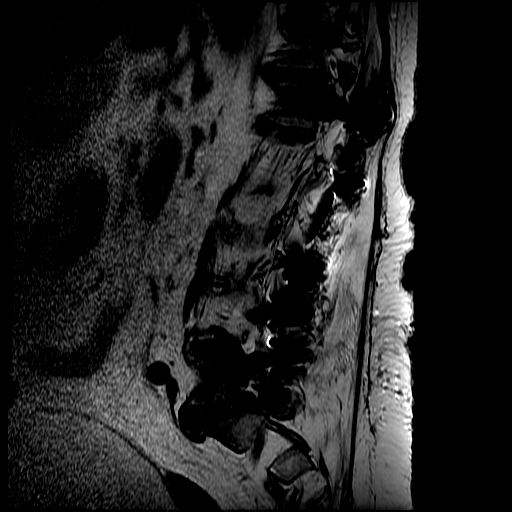
[im 18/18]
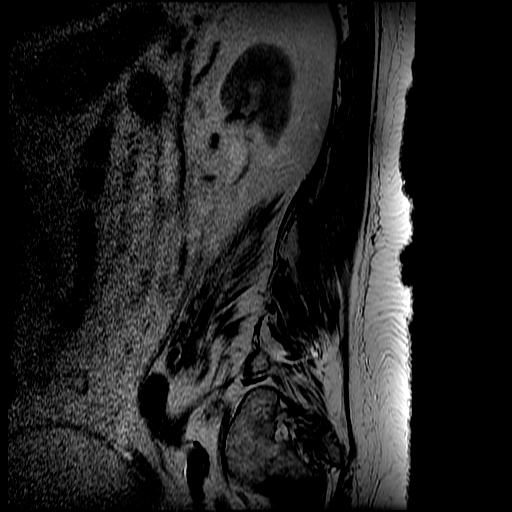

[Series 6: T2 · axial · 4.0mm · 0.39mm/px · z∈[-95,+111]mm · 5 of 46 slices shown]
[im 1/46]
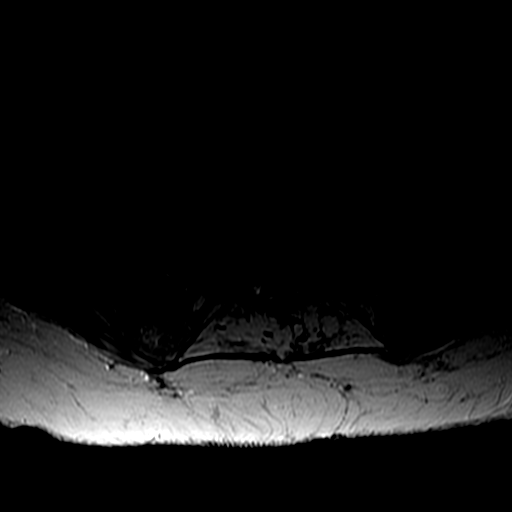
[im 7/46]
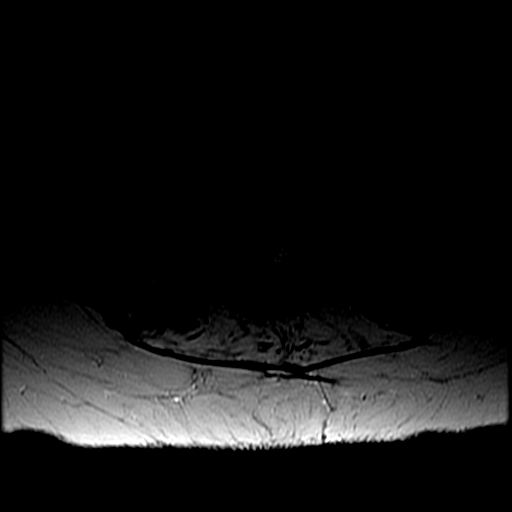
[im 13/46]
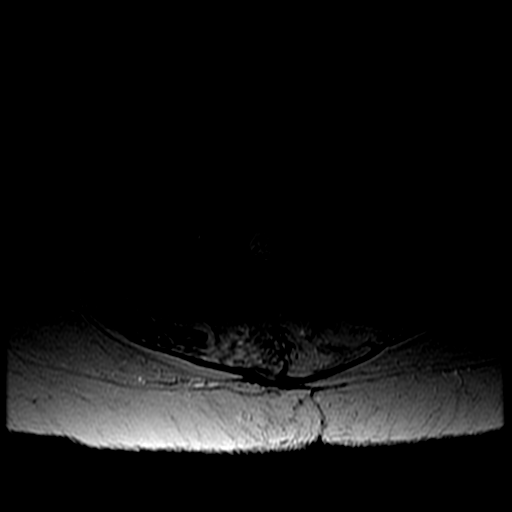
[im 23/46]
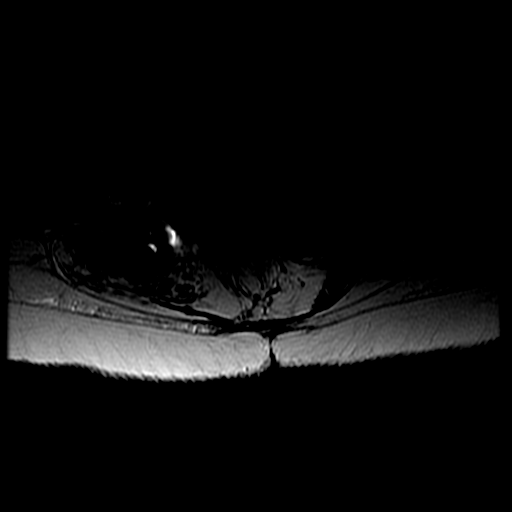
[im 39/46]
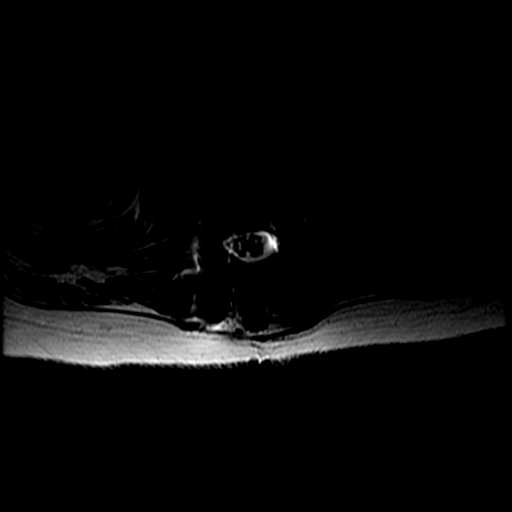

[Series 7: T1 · axial · 4.0mm · 0.39mm/px · z∈[-65,+111]mm · 3 of 46 slices shown (2 of 2)]
[im 7/46]
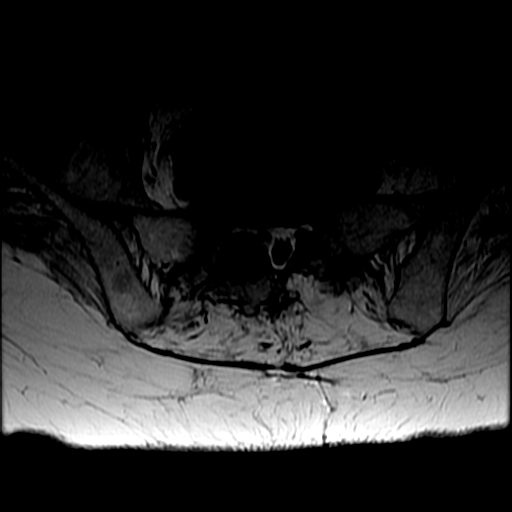
[im 23/46]
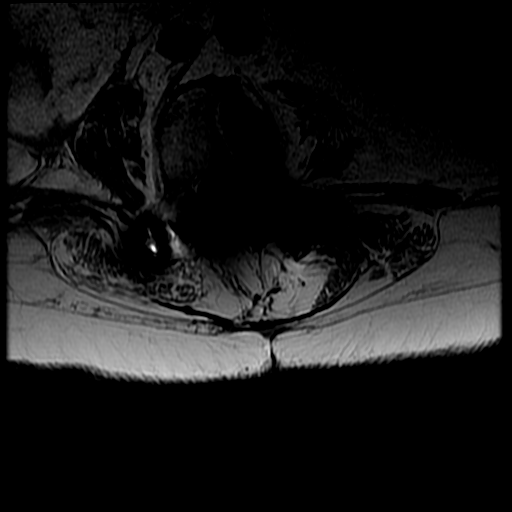
[im 39/46]
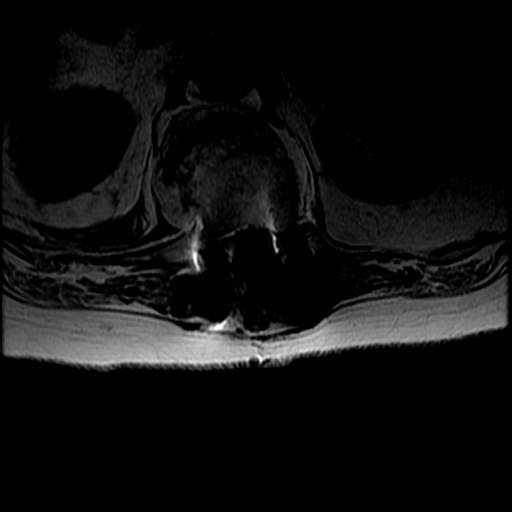

[17 of 48 positions shown; findings below may reference images not displayed]

FINDINGS: For the purposes of this dictation, lowest well-formed
intervertebral disc spaces presumed to be the L5-S1 level, and there
presumed to be 5 lumbar type vertebral bodies.

Dextroscoliosis with apex at the L2-3 level. Vertebral bodies
otherwise normally aligned with preservation of the normal lumbar
lordosis. Vertebral body heights maintained. No evidence for
fracture. Signal intensity within the vertebral body bone marrow
within normal limits.

Postoperative changes from fairly extensive remote thoracolumbar
fusion and decompression present. Patient is fused from the T12
through L5 levels. Bilateral transpedicular screws in place at T12
L4 and L5. Right-sided transpedicular screws in place at L1 and L2,
with a left-sided transpedicular screw in place at L3. Interbody
fusions in place at L2-3, L3-4, and L4-5. Essentially complete
ankylosis/ fusion at the T12-L1 and L1-2 levels as well. No
complication about the hardware seen on this examination.

Conus medullaris terminates normally at the T12-L1 level. Signal
intensity within the visualized cord is normal. Nerve roots of the
cauda equina within normal limits.

No acute paraspinous soft tissue abnormality. Fatty atrophy noted
within the paraspinous and psoas musculature, greater on the left.
Partially visualized urinary bladder appears moderately distended.

T10-11: Seen only on sagittal projection. Disc desiccation without
significant disc bulge. Mild canal stenosis. No significant
foraminal narrowing.

T11-12: Disc desiccation with mild annular disc bulge. Endplate
osteophytic spurring along the right anterior lateral aspect of the
T11-12 disc space. Bilateral facet arthrosis. Resultant mild canal
stenosis. Mild bilateral foraminal narrowing.

T12-L1: Status post fusion. Bilateral facet arthrosis. Prominent
osteophytic overgrowth along the right anterolateral and lateral
aspect of the fused T12-L1 disc space. Mild right lateral recess
stenosis. Mild foraminal narrowing bilaterally.

L1-2: Status post fusion and posterior decompression. Small central
osseous ridge or possibly residual disc material indents the ventral
thecal sac without significant canal stenosis. Foramina grossly
patent.

L2-3: Status post decompression with fusion. Central canal widely
patent. No significant foraminal stenosis.

L3-4: Status post decompression with fusion. Central canal widely
patent. Probable mild left foraminal narrowing. Right neural foramen
widely patent.

L4-5: Status post wide decompression with fusion. Central canal
widely patent. Bilateral facet arthrosis. Mild to moderate right
foraminal narrowing related to facet disease. Left neural foramina
widely patent.

L5-S1: Diffuse degenerative intervertebral disc space narrowing with
disc desiccation and disc bulge. No focal disc protrusion. Moderate
to severe bilateral facet arthrosis. Fairly severe right foraminal
narrowing related to disc bulge and osteophytic overgrowth at the
hypertrophied right L5-S1 facet (series 3, image 6). More mild left
foraminal stenosis. Mild to moderate canal narrowing.
IMPRESSION: 1. Postoperative changes from remote thoracolumbar fusion and
decompression at T12 through L5 as above. No significant canal or
foraminal stenosis at these levels.
2. Multifactorial degenerative spondylolysis at L5-S1 with resultant
severe right foraminal stenosis. This could potentially result in
the patient's right lower extremity symptoms.
3. Dextroscoliosis with additional more mild multilevel degenerative
changes as above. Please see above report for a full description of
these findings.
4. Age-related fatty atrophy within the paraspinous and psoas
musculature.

## 2017-03-18 IMAGING — DX DG LUMBAR SPINE 2-3V
3 series · 4 of 4 positions shown · non-contrast
Comparison: None.

CLINICAL DATA: Pre mri images; c/o abdominal spasms, numerous
previous spinal surgeries

EXAM:
LUMBAR SPINE - 2-3 VIEW

[Series 1: l-spine ap · 0.14mm/px · 2 of 2 slices shown]
[im 1/2]
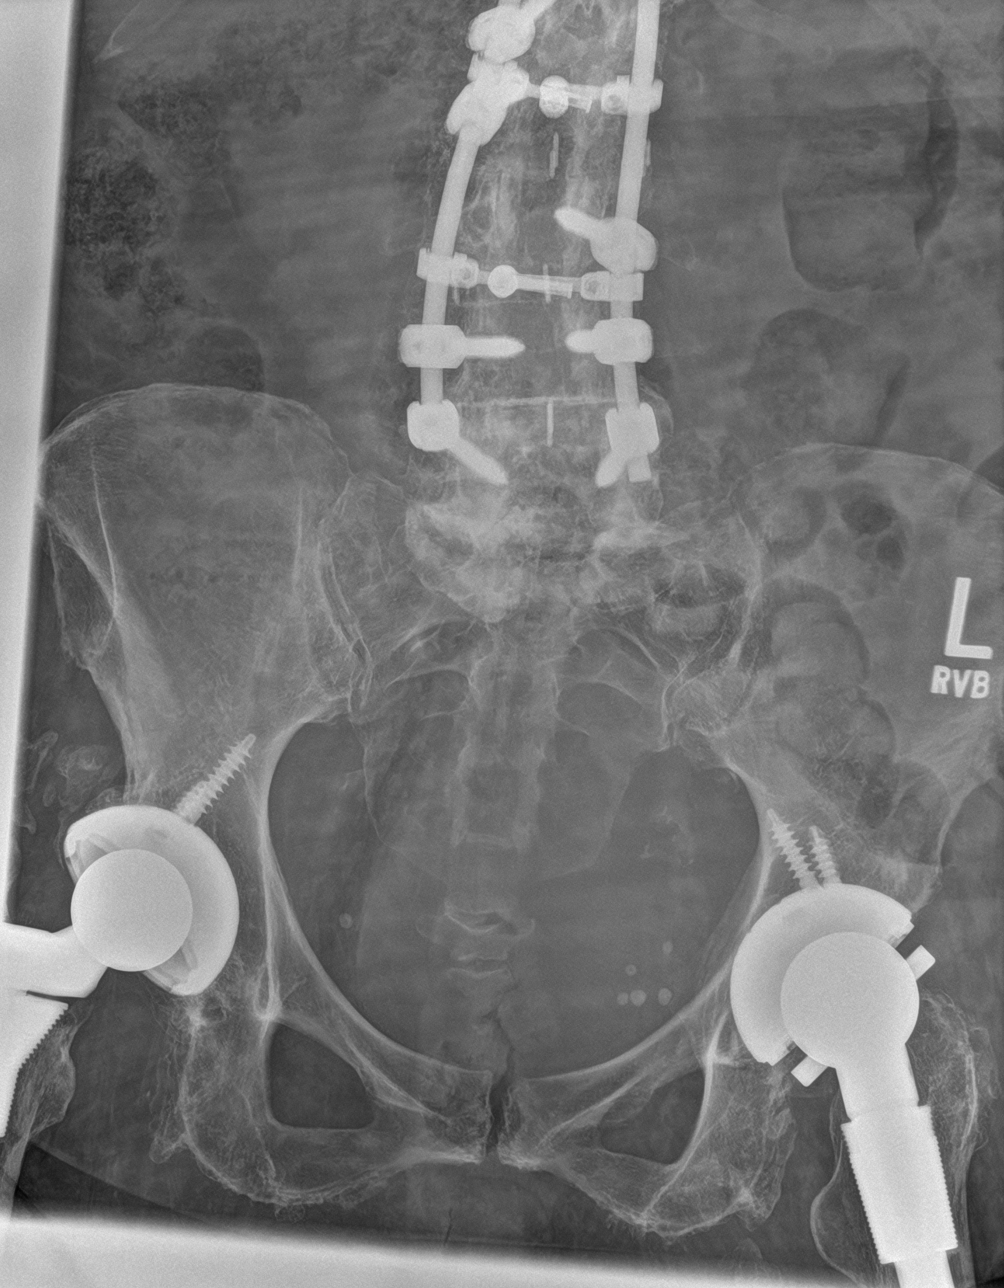
[im 2/2]
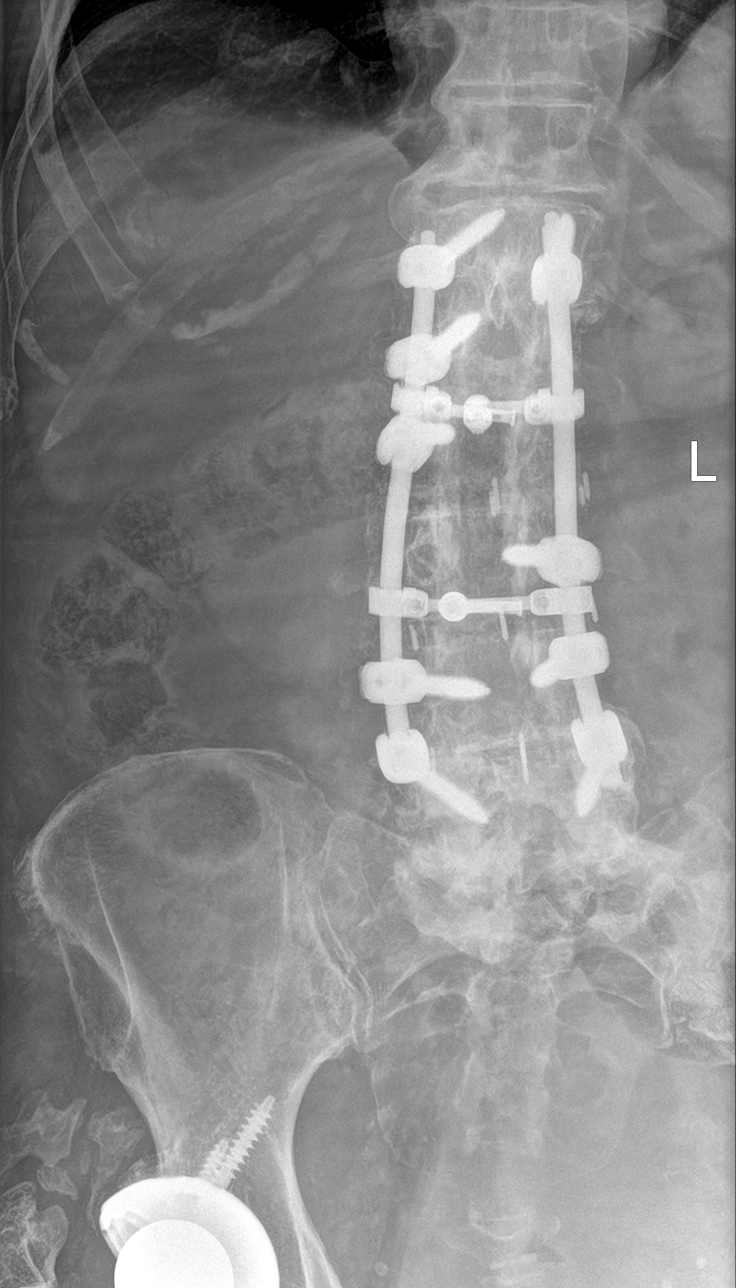

[l-spine lat]
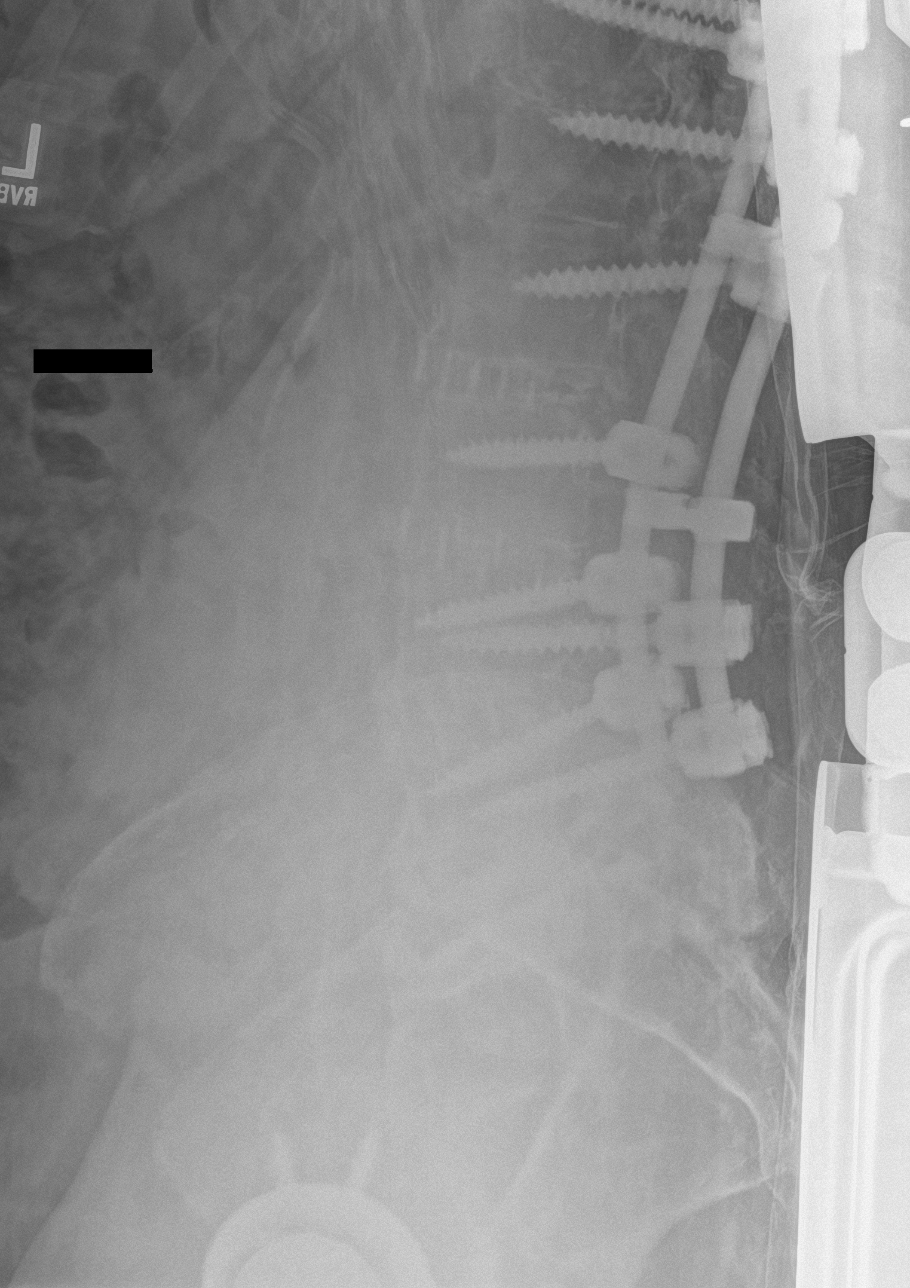

[l-spine spot]
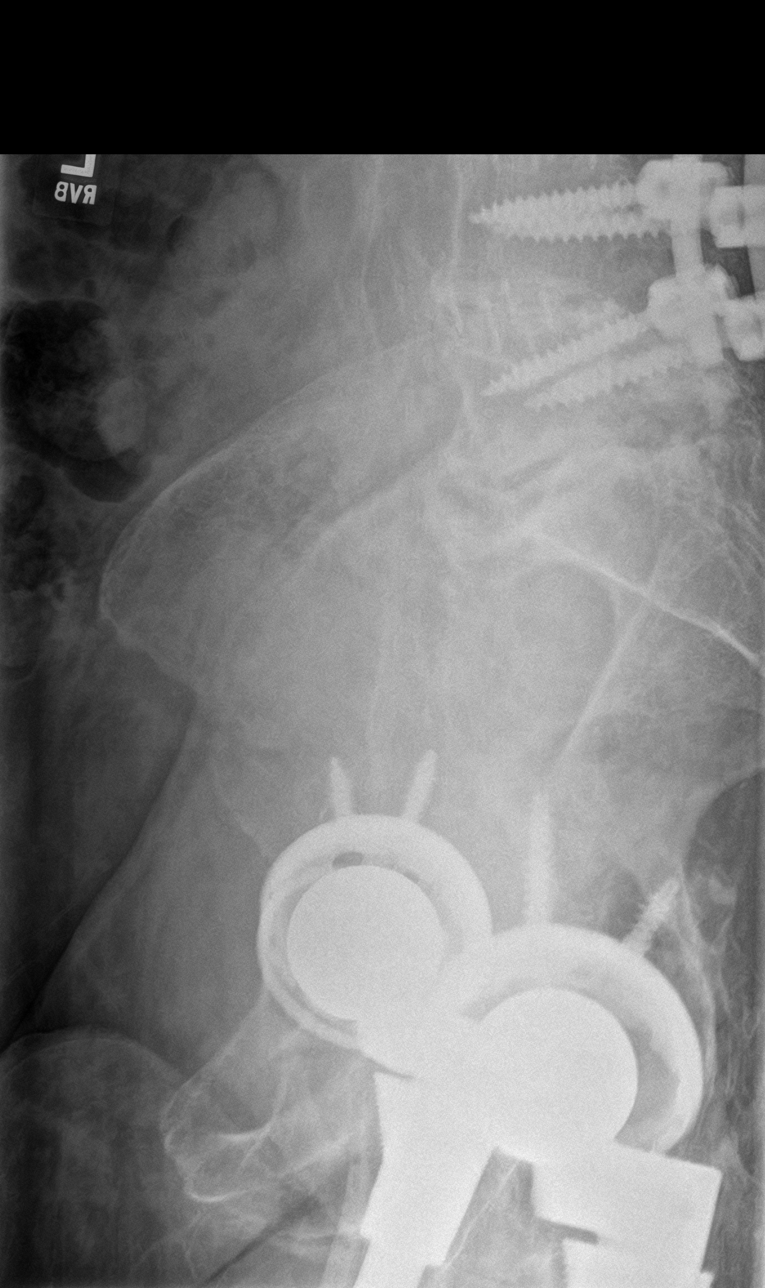

[4 of 4 positions shown; findings below may reference images not displayed]

FINDINGS: Spinal rods throughout the lumbar spine. Bilateral hip replacements.
Spinal rods extend from T12 through L5. Cross-table lateral
extremely limited. There appears to be interbody fusion device at
L4-5, L3-4, and L2-3.
IMPRESSION: Extensive prior surgery.

## 2017-03-20 IMAGING — DX DG CHEST 1V PORT
1 series · 1 of 1 positions shown · non-contrast
Comparison: 10/29/2008

CLINICAL DATA: Leg weakness

EXAM:
PORTABLE CHEST 1 VIEW

[chest ap]
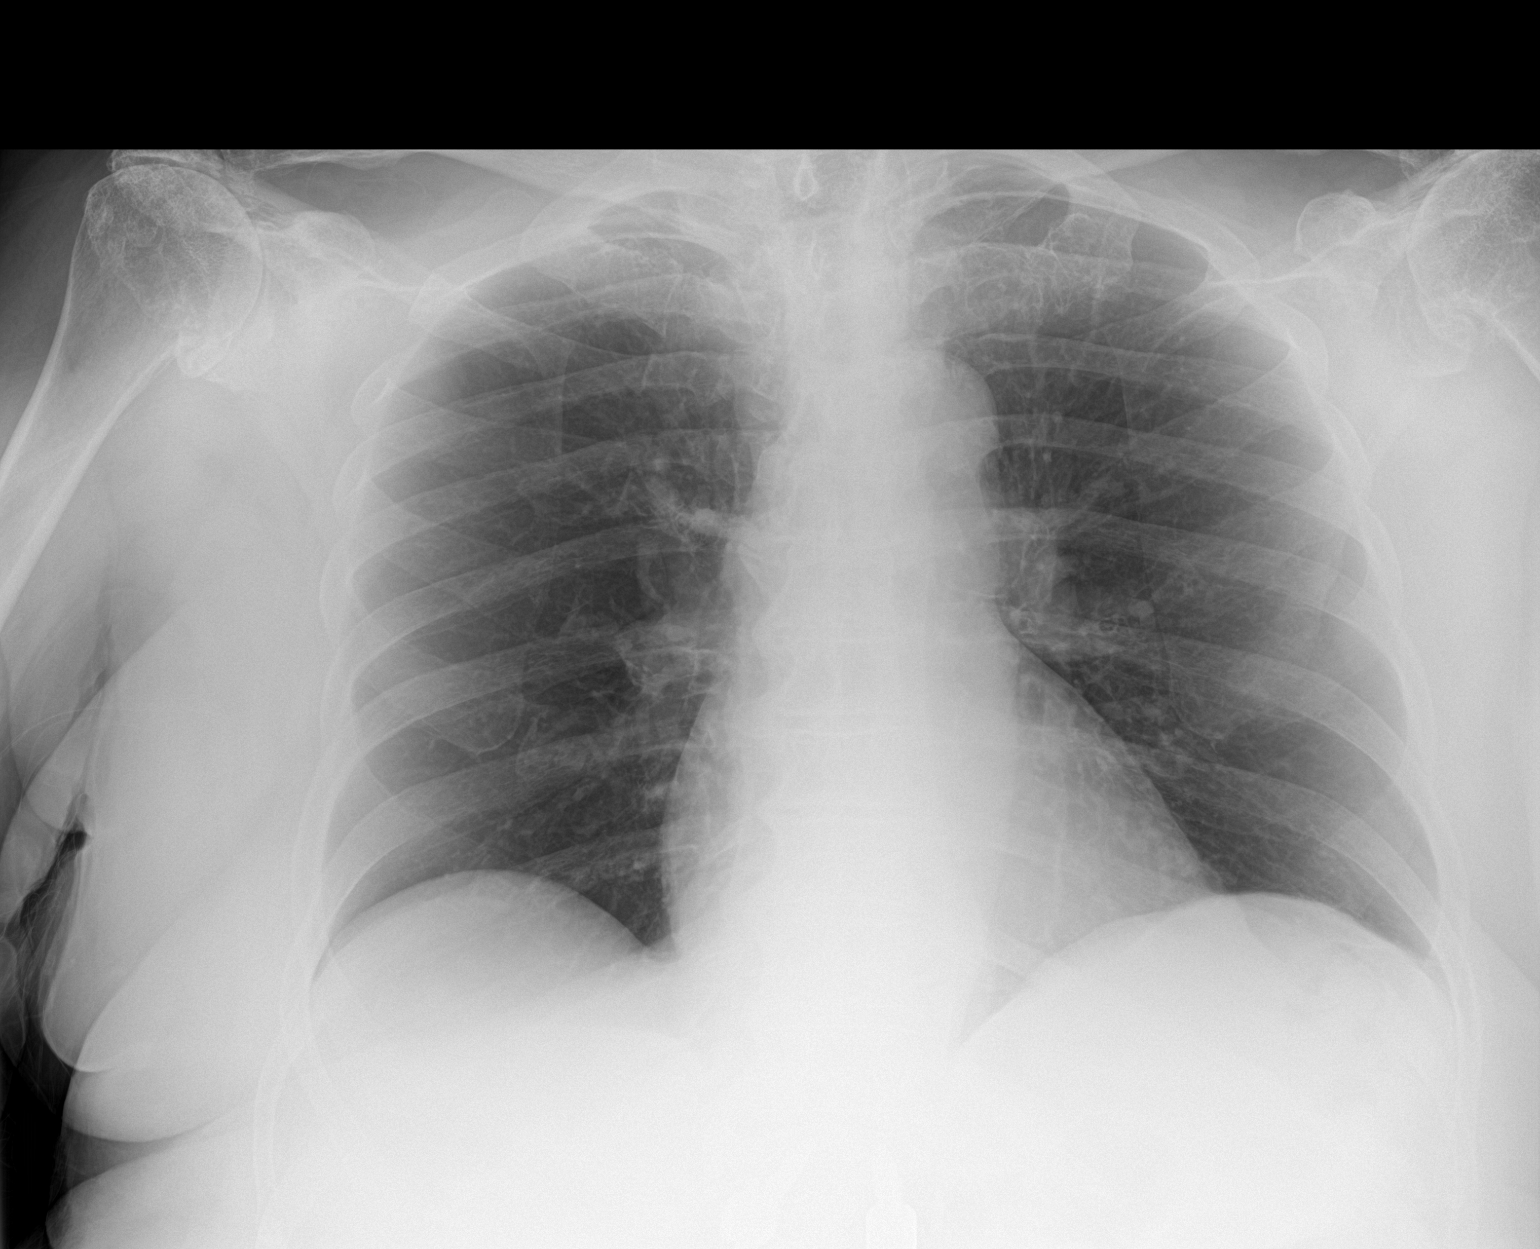

[1 of 1 positions shown; findings below may reference images not displayed]

FINDINGS: Normal heart size. Lungs clear. No pneumothorax. No pleural
effusion. Severe chronic changes at both shoulder joints with
chronic rotator cuff tearing.
IMPRESSION: No active disease.

## 2017-04-01 DIAGNOSIS — Z7901 Long term (current) use of anticoagulants: Secondary | ICD-10-CM | POA: Diagnosis not present

## 2017-04-01 DIAGNOSIS — R3915 Urgency of urination: Secondary | ICD-10-CM | POA: Diagnosis not present

## 2017-04-01 DIAGNOSIS — I4891 Unspecified atrial fibrillation: Secondary | ICD-10-CM | POA: Diagnosis not present

## 2017-04-01 DIAGNOSIS — Z23 Encounter for immunization: Secondary | ICD-10-CM | POA: Diagnosis not present

## 2017-04-04 DIAGNOSIS — Z7901 Long term (current) use of anticoagulants: Secondary | ICD-10-CM | POA: Diagnosis not present

## 2017-04-13 DIAGNOSIS — I4891 Unspecified atrial fibrillation: Secondary | ICD-10-CM | POA: Diagnosis not present

## 2017-04-13 DIAGNOSIS — Z7901 Long term (current) use of anticoagulants: Secondary | ICD-10-CM | POA: Diagnosis not present

## 2017-04-29 DIAGNOSIS — I4891 Unspecified atrial fibrillation: Secondary | ICD-10-CM | POA: Diagnosis not present

## 2017-04-29 DIAGNOSIS — Z7901 Long term (current) use of anticoagulants: Secondary | ICD-10-CM | POA: Diagnosis not present

## 2017-05-27 DIAGNOSIS — Z7901 Long term (current) use of anticoagulants: Secondary | ICD-10-CM | POA: Diagnosis not present

## 2017-05-27 DIAGNOSIS — I482 Chronic atrial fibrillation: Secondary | ICD-10-CM | POA: Diagnosis not present

## 2017-07-04 DIAGNOSIS — H35372 Puckering of macula, left eye: Secondary | ICD-10-CM | POA: Diagnosis not present

## 2017-07-04 DIAGNOSIS — H26493 Other secondary cataract, bilateral: Secondary | ICD-10-CM | POA: Diagnosis not present

## 2017-07-04 DIAGNOSIS — Z961 Presence of intraocular lens: Secondary | ICD-10-CM | POA: Diagnosis not present

## 2017-07-04 DIAGNOSIS — H40013 Open angle with borderline findings, low risk, bilateral: Secondary | ICD-10-CM | POA: Diagnosis not present

## 2017-07-20 DIAGNOSIS — E7800 Pure hypercholesterolemia, unspecified: Secondary | ICD-10-CM | POA: Diagnosis not present

## 2017-07-20 DIAGNOSIS — R609 Edema, unspecified: Secondary | ICD-10-CM | POA: Diagnosis not present

## 2017-07-20 DIAGNOSIS — I10 Essential (primary) hypertension: Secondary | ICD-10-CM | POA: Diagnosis not present

## 2017-07-20 DIAGNOSIS — N183 Chronic kidney disease, stage 3 (moderate): Secondary | ICD-10-CM | POA: Diagnosis not present

## 2017-07-20 DIAGNOSIS — I4891 Unspecified atrial fibrillation: Secondary | ICD-10-CM | POA: Diagnosis not present

## 2017-07-20 DIAGNOSIS — Z7901 Long term (current) use of anticoagulants: Secondary | ICD-10-CM | POA: Diagnosis not present

## 2017-07-20 DIAGNOSIS — E1121 Type 2 diabetes mellitus with diabetic nephropathy: Secondary | ICD-10-CM | POA: Diagnosis not present

## 2017-08-10 ENCOUNTER — Ambulatory Visit (INDEPENDENT_AMBULATORY_CARE_PROVIDER_SITE_OTHER): Payer: Medicare Other | Admitting: Cardiology

## 2017-08-10 ENCOUNTER — Encounter: Payer: Self-pay | Admitting: Cardiology

## 2017-08-10 VITALS — BP 140/86 | HR 74 | Ht 66.0 in | Wt 247.4 lb

## 2017-08-10 DIAGNOSIS — I10 Essential (primary) hypertension: Secondary | ICD-10-CM | POA: Diagnosis not present

## 2017-08-10 DIAGNOSIS — I482 Chronic atrial fibrillation, unspecified: Secondary | ICD-10-CM

## 2017-08-10 NOTE — Progress Notes (Signed)
[facility]. [address], [place]  [address] Phone: [phone] Fax:  [phone]  Date:  08/10/2017   ID:  [name] [name], DOB [birth year], MRN [id number]  PCP:  Hulan Fess, MD   History of Present Illness: [name] [name] is a 84 y.o. female with permanent atrial fibrillation, chronic anticoagulation, hypertension, hyperlipidemia, diabetes who  here to re-establish care. It has been over 3 years since last visit.    Overall she is doing well.  No chest pain, no shortness of breath, no bleeding.  She has been quite stable with her Coumadin.  Dr. Rex Kras has been managing.  In the past, she had a GI bleed on Xarelto.  She uses a walker for ambulation. Prior consult note from Dr. Marin Olp noted.    Wt Readings from Last 3 Encounters:  08/10/17 247 lb 6.4 oz (112.2 kg)  11/06/15 239 lb (108.4 kg)  10/31/15 244 lb (110.7 kg)     Past Medical History:  Diagnosis Date  . Acute blood loss anemia   . Bilateral lower extremity edema   . Chronic atrial fibrillation ([place])   . Constipation   . Diabetes ([place])    prediabetes  . GERD (gastroesophageal reflux disease)   . Hyperlipidemia   . Hypertension   . Hypokalemia   . OAB (overactive bladder)   . Osteoarthritis   . Pressure ulcer   . Protein calorie malnutrition ([place])   . Spinal stenosis, thoracic   . UTI (lower urinary tract infection)   . Weakness of both lower extremities     Past Surgical History:  Procedure Laterality Date  . BACK SURGERY    . CARPAL TUNNEL RELEASE Right   . CATARACT EXTRACTION  Apr 11, 2012, Jun 04 2012  . HIP SURGERY     restrictor in left hip due to 3 hip dislocations  . KNEE SURGERY     2 total knees -right and left  . NECK SURGERY    . THORACIC DISCECTOMY N/A 09/19/2015   Procedure: THORACIC LAMINECTOMY DECOMPRESSION T2-3, T3-4,T5-6;  Surgeon: Kevan Ny Ditty, MD;  Location: [facility];  Service: Neurosurgery;  Laterality: N/A;  THORACIC LAMINECTOMY DECOMPRESSION T2-3,  T3-4,T5-6  . TONSILLECTOMY    . TOTAL HIP ARTHROPLASTY     right and left hips  . WISDOM TOOTH EXTRACTION      Current Outpatient Medications  Medication Sig Dispense Refill  . acetaminophen (TYLENOL) 325 MG tablet Take 2 tablets (650 mg total) by mouth every 4 (four) hours as needed (temp > 100.5). 30 tablet 0  . bisoprolol-hydrochlorothiazide (ZIAC) 10-6.25 MG per tablet Take 1 tablet by mouth 2 (two) times daily.     . cyclobenzaprine (FLEXERIL) 5 MG tablet Take 5 mg by mouth 2 (two) times daily as needed for muscle spasms.    . Ergocalciferol (VITAMIN D2) 2000 units TABS Take 1 tablet by mouth daily.    . hydrocortisone cream 1 % Apply 1 application topically daily as needed for itching.    . loperamide (IMODIUM A-D) 2 MG tablet Take 4 mg by mouth 4 (four) times daily as needed for diarrhea or loose stools.    Marland Kitchen losartan (COZAAR) 100 MG tablet Take 100 mg by mouth daily.    Marland Kitchen oxybutynin (DITROPAN-XL) 5 MG 24 hr tablet Take 5 mg by mouth daily.    . ranitidine (ZANTAC) 75 MG tablet Take 75 mg by mouth daily as needed for heartburn.    Marland Kitchen  rosuvastatin (CRESTOR) 10 MG tablet Take 10 mg by mouth. M-W-F at bedtime    . warfarin (COUMADIN) 2 MG tablet Take 1.5 tablets (3 mg) by mouth daily on Mondays, Wednesdays, Fidays, and Sundays. Take 1 tablet (2 mg) by mouth daily on Tuesdays, Thursdays, and Saturdays.  0   No current facility-administered medications for this visit.     Allergies:    Allergies  Allergen Reactions  . Nsaids     On coumadin  . Statins     Cause leg cramps    Social History:  The patient  reports that she quit smoking about 41 years ago. Her smoking use included cigarettes. She quit after 20.00 years of use. she has never used smokeless tobacco. She reports that she drinks alcohol. She reports that she does not use drugs.   Family History  Problem Relation Age of Onset  . Heart disease Mother   . Colon cancer Father        dx in his late 73's, died age 89  .  Uterine cancer Sister 66       spread to colon   FHX: Father colon CA, sister colon CA. Mother CHF.   ROS:  Please see the history of present illness. All other ROS neg.   PHYSICAL EXAM: VS:  BP 140/86   Pulse 74   Ht 5\' 6"  (1.676 m)   Wt 247 lb 6.4 oz (112.2 kg)   BMI 39.93 kg/m  Well nourished, well developed, in no acute distress  HEENT: normal, [place]/AT, EOMI Neck: no JVD, normal carotid upstroke, no bruit Cardiac:  normal S1, S2; irreg irreg; no murmur  Lungs:  clear to auscultation bilaterally, no wheezing, rhonchi or rales  Abd: soft, nontender, no hepatomegaly, no bruits obese Ext: no edema, 2+ distal pulses Skin: warm and dry  GU: deferred Neuro: no focal abnormalities noted, AAO x 3  EKG: Today 08/10/17 - AFIB 74 no change.  07/22/14-atrial fibrillation, 87, no other abnormalities     ASSESSMENT AND PLAN:  1. Permanent atrial fibrillation-well rate controlled. No changes made. Medicines reviewed.  Excellent heart rate. 2. Chronic anticoagulation-Dr. Little has been following her Coumadin. No recent bleeding episodes. Prior gastrointestinal bleeding on Xarelto.  We discussed once again.  Doing well. 3. Morbid obesity (BMI greater than 35 with hypertension, atrial fibrillation, hyperlipidemia)- continue to encourage weight loss.  She does admit that it is troublesome to perform any exercise given that she uses a walker.  Reduce carbohydrates. 4. Essential hypertension-well controlled. Medications reviewed, no changes 5. Hyperlipidemia-Crestor. Monitored by Dr. Rex Kras.  She is going to have blood work Architectural technologist. 6. 1 year follow up.  Signed, Candee Furbish, MD [facility]  08/10/2017 3:32 PM

## 2017-08-10 NOTE — Patient Instructions (Signed)

## 2017-08-25 DIAGNOSIS — I10 Essential (primary) hypertension: Secondary | ICD-10-CM | POA: Diagnosis not present

## 2017-08-25 DIAGNOSIS — I4891 Unspecified atrial fibrillation: Secondary | ICD-10-CM | POA: Diagnosis not present

## 2017-08-25 DIAGNOSIS — N183 Chronic kidney disease, stage 3 (moderate): Secondary | ICD-10-CM | POA: Diagnosis not present

## 2017-08-25 DIAGNOSIS — Z7901 Long term (current) use of anticoagulants: Secondary | ICD-10-CM | POA: Diagnosis not present

## 2017-08-25 DIAGNOSIS — E1121 Type 2 diabetes mellitus with diabetic nephropathy: Secondary | ICD-10-CM | POA: Diagnosis not present

## 2017-08-25 DIAGNOSIS — E7800 Pure hypercholesterolemia, unspecified: Secondary | ICD-10-CM | POA: Diagnosis not present

## 2017-09-13 DIAGNOSIS — L821 Other seborrheic keratosis: Secondary | ICD-10-CM | POA: Diagnosis not present

## 2017-09-13 DIAGNOSIS — L570 Actinic keratosis: Secondary | ICD-10-CM | POA: Diagnosis not present

## 2017-11-09 DIAGNOSIS — Z7901 Long term (current) use of anticoagulants: Secondary | ICD-10-CM | POA: Diagnosis not present

## 2017-11-09 DIAGNOSIS — I4891 Unspecified atrial fibrillation: Secondary | ICD-10-CM | POA: Diagnosis not present

## 2017-12-01 DIAGNOSIS — I4891 Unspecified atrial fibrillation: Secondary | ICD-10-CM | POA: Diagnosis not present

## 2017-12-01 DIAGNOSIS — Z7901 Long term (current) use of anticoagulants: Secondary | ICD-10-CM | POA: Diagnosis not present

## 2018-01-05 DIAGNOSIS — Z7901 Long term (current) use of anticoagulants: Secondary | ICD-10-CM | POA: Diagnosis not present

## 2018-01-05 DIAGNOSIS — I4891 Unspecified atrial fibrillation: Secondary | ICD-10-CM | POA: Diagnosis not present

## 2018-01-25 DIAGNOSIS — I4891 Unspecified atrial fibrillation: Secondary | ICD-10-CM | POA: Diagnosis not present

## 2018-01-25 DIAGNOSIS — Z7901 Long term (current) use of anticoagulants: Secondary | ICD-10-CM | POA: Diagnosis not present

## 2018-03-15 DIAGNOSIS — Z6837 Body mass index (BMI) 37.0-37.9, adult: Secondary | ICD-10-CM | POA: Diagnosis not present

## 2018-03-15 DIAGNOSIS — M546 Pain in thoracic spine: Secondary | ICD-10-CM | POA: Diagnosis not present

## 2018-03-15 DIAGNOSIS — I10 Essential (primary) hypertension: Secondary | ICD-10-CM | POA: Diagnosis not present

## 2018-03-17 DIAGNOSIS — Z7901 Long term (current) use of anticoagulants: Secondary | ICD-10-CM | POA: Diagnosis not present

## 2018-05-17 DIAGNOSIS — I4891 Unspecified atrial fibrillation: Secondary | ICD-10-CM | POA: Diagnosis not present

## 2018-05-17 DIAGNOSIS — Z7901 Long term (current) use of anticoagulants: Secondary | ICD-10-CM | POA: Diagnosis not present

## 2018-06-16 DIAGNOSIS — Z7901 Long term (current) use of anticoagulants: Secondary | ICD-10-CM | POA: Diagnosis not present

## 2018-07-20 DIAGNOSIS — Z7901 Long term (current) use of anticoagulants: Secondary | ICD-10-CM | POA: Diagnosis not present

## 2018-07-20 DIAGNOSIS — I4891 Unspecified atrial fibrillation: Secondary | ICD-10-CM | POA: Diagnosis not present

## 2018-07-26 DIAGNOSIS — R195 Other fecal abnormalities: Secondary | ICD-10-CM | POA: Diagnosis not present

## 2018-07-26 DIAGNOSIS — I4891 Unspecified atrial fibrillation: Secondary | ICD-10-CM | POA: Diagnosis not present

## 2018-07-26 DIAGNOSIS — E1121 Type 2 diabetes mellitus with diabetic nephropathy: Secondary | ICD-10-CM | POA: Diagnosis not present

## 2018-07-26 DIAGNOSIS — Z23 Encounter for immunization: Secondary | ICD-10-CM | POA: Diagnosis not present

## 2018-07-26 DIAGNOSIS — Z6838 Body mass index (BMI) 38.0-38.9, adult: Secondary | ICD-10-CM | POA: Diagnosis not present

## 2018-07-26 DIAGNOSIS — E7800 Pure hypercholesterolemia, unspecified: Secondary | ICD-10-CM | POA: Diagnosis not present

## 2018-07-26 DIAGNOSIS — N183 Chronic kidney disease, stage 3 (moderate): Secondary | ICD-10-CM | POA: Diagnosis not present

## 2018-07-26 DIAGNOSIS — I10 Essential (primary) hypertension: Secondary | ICD-10-CM | POA: Diagnosis not present

## 2018-07-26 DIAGNOSIS — Z7901 Long term (current) use of anticoagulants: Secondary | ICD-10-CM | POA: Diagnosis not present

## 2018-08-07 DIAGNOSIS — R195 Other fecal abnormalities: Secondary | ICD-10-CM | POA: Diagnosis not present

## 2018-09-13 ENCOUNTER — Telehealth: Payer: Self-pay

## 2018-09-13 NOTE — Telephone Encounter (Signed)
Ms. [name] [name] has an appointment with Dr. Marlou Porch 3/20. She states she is doing fine and is happy to wait until coronavirus precautions pass to come in the office. She understands she will be called to reschedule as she could not reschedule now. She was grateful for assistance.

## 2018-09-15 ENCOUNTER — Ambulatory Visit: Payer: Medicare Other | Admitting: Cardiology

## 2018-09-29 ENCOUNTER — Telehealth: Payer: Self-pay | Admitting: Cardiology

## 2018-09-29 NOTE — Telephone Encounter (Signed)
Spoke with patient regarding upcoming appointment.  Confirmed her demographics and e-mail.  Set up her My Chart while on the phone.  She does not use a cell phone and prefers a phone call for her visit.

## 2018-10-01 NOTE — Progress Notes (Signed)
Virtual Visit via Telephone Note   This visit type was conducted due to national recommendations for restrictions regarding the COVID-19 Pandemic (e.g. social distancing) in an effort to limit this patient's exposure and mitigate transmission in our community.  Due to her co-morbid illnesses, this patient is at least at moderate risk for complications without adequate follow up.  This format is felt to be most appropriate for this patient at this time.  The patient did not have access to video technology/had technical difficulties with video requiring transitioning to audio format only (telephone).  All issues noted in this document were discussed and addressed.  No physical exam could be performed with this format.  Please refer to the patient's chart for her  consent to telehealth for [facility].   Evaluation Performed:  Follow-up visit  Date:  10/02/2018   ID:  [name] [name], DOB 05-11-1934, MRN [id number]  Patient Location:  Home   Provider location:   Home   PCP:  Hulan Fess, MD  Cardiologist:  Candee Furbish, MD   Chief Complaint:  Follow up atrial fibrillation   History of Present Illness:    [name] [name] is a 85 y.o. female who presents via audio/video conferencing for a telehealth visit today.   [name] has a prior hx of permanent atrial fibrillation on chronic anticoagulation, hypertension, hyperlipidemia and diabetes who was last seen by Dr. Marlou Porch on 08/10/2017. Prior to that, it had been approximately 3 years since her last cardiology appointment. Overall since that that time, she was noted to be doing well with no real complaint. She takes Coumadin for anticoagulation in which Dr. Rex Kras, her PCP has been managing. In the past, she had a GI on Xarelto and she was then transitioned to Coumadin approximately 4 years ago.  Today, she states that she has been doing well without any real complaint.  She denies chest pain, palpitations, LE swelling, shortness of breath,  dizziness, falls, fevers, chills or other infectious process.  She has no complaints of orthopnea symptoms.  She lives alone however, her grandson lives several blocks away and checks on her often.  She walks with a walker which is her biggest limiting factor for increasing her exercise.  Seen by her PCP in 06/2018 with stable labs.    The patient does not have symptoms concerning for COVID-19 infection (fever, chills, cough, or new shortness of breath).   Prior CV studies:   The following studies were reviewed today:  Echocardiogram 05/03/2012: Study Conclusions  - Procedure narrative: Transthoracic echocardiography. Image quality was suboptimal. The study was technically difficult, as a result of poor sound wave transmission. - Left ventricle: Systolic function was normal. The estimated ejection fraction was in the range of 55% to 60%. - Mitral valve: Calcified annulus. Mildly thickened leaflets . - Left atrium: The atrium was mildly dilated. - Right atrium: The atrium was mildly dilated. Transthoracic echocardiography. M-mode, complete 2D, spectral Doppler, and color Doppler. Height: Height: 172.7cm. Height: 68in. Weight: Weight: 118kg. Weight: 259.6lb. Body mass index: BMI: 39.6kg/m^2. Body surface area:  BSA: 2.23m^2. Blood pressure:   123/71. Patient status: Inpatient. Location: Echo laboratory.  Past Medical History:  Diagnosis Date  . Acute blood loss anemia   . Bilateral lower extremity edema   . Chronic atrial fibrillation   . Constipation   . Diabetes ([place])    prediabetes  . GERD (gastroesophageal reflux disease)   . Hyperlipidemia   . Hypertension   . Hypokalemia   .  OAB (overactive bladder)   . Osteoarthritis   . Pressure ulcer   . Protein calorie malnutrition ([place])   . Spinal stenosis, thoracic   . UTI (lower urinary tract infection)   . Weakness of both lower extremities    Past Surgical History:  Procedure Laterality Date  .  BACK SURGERY    . CARPAL TUNNEL RELEASE Right   . CATARACT EXTRACTION  Apr 11, 2012, Jun 04 2012  . HIP SURGERY     restrictor in left hip due to 3 hip dislocations  . KNEE SURGERY     2 total knees -right and left  . NECK SURGERY    . THORACIC DISCECTOMY N/A 09/19/2015   Procedure: THORACIC LAMINECTOMY DECOMPRESSION T2-3, T3-4,T5-6;  Surgeon: Kevan Ny Ditty, MD;  Location: [facility];  Service: Neurosurgery;  Laterality: N/A;  THORACIC LAMINECTOMY DECOMPRESSION T2-3, T3-4,T5-6  . TONSILLECTOMY    . TOTAL HIP ARTHROPLASTY     right and left hips  . WISDOM TOOTH EXTRACTION       Current Meds  Medication Sig  . acetaminophen (TYLENOL) 325 MG tablet Take 2 tablets (650 mg total) by mouth every 4 (four) hours as needed (temp > 100.5).  . bisoprolol-hydrochlorothiazide (ZIAC) 10-6.25 MG per tablet Take 1 tablet by mouth 2 (two) times daily.   . cyclobenzaprine (FLEXERIL) 5 MG tablet Take 5 mg by mouth 2 (two) times daily as needed for muscle spasms.  . Ergocalciferol (VITAMIN D2) 2000 units TABS Take 1 tablet by mouth daily.  . hydrocortisone cream 1 % Apply 1 application topically daily as needed for itching.  . loperamide (IMODIUM A-D) 2 MG tablet Take 4 mg by mouth 4 (four) times daily as needed for diarrhea or loose stools.  Marland Kitchen losartan (COZAAR) 100 MG tablet Take 100 mg by mouth daily.  Marland Kitchen oxybutynin (DITROPAN-XL) 5 MG 24 hr tablet Take 5 mg by mouth daily.  . ranitidine (ZANTAC) 75 MG tablet Take 75 mg by mouth daily as needed for heartburn.  . rosuvastatin (CRESTOR) 10 MG tablet Take 10 mg by mouth. M-W-F at bedtime  . warfarin (COUMADIN) 2 MG tablet Take 1.5 tablets (3 mg) by mouth daily on Mondays, Wednesdays, Fidays, and Sundays. Take 1 tablet (2 mg) by mouth daily on Tuesdays, Thursdays, and Saturdays.     Allergies:   Nsaids and Statins   Social History   Tobacco Use  . Smoking status: Former Smoker    Years: 20.00    Types: Cigarettes    Last attempt to quit:  09/15/1975    Years since quitting: 43.0  . Smokeless tobacco: Never Used  Substance Use Topics  . Alcohol use: Yes    Alcohol/week: 0.0 standard drinks    Comment: social  . Drug use: No    Family Hx: The patient's family history includes Colon cancer in her father; Heart disease in her mother; Uterine cancer (age of onset: 29) in her sister.  ROS:   Please see the history of present illness.     All other systems reviewed and are negative.  Labs/Other Tests and Data Reviewed:    Recent Labs: No results found for requested labs within last 8760 hours.   Recent Lipid Panel No results found for: CHOL, TRIG, HDL, CHOLHDL, LDLCALC, LDLDIRECT  Wt Readings from Last 3 Encounters:  10/02/18 244 lb (110.7 kg)  08/10/17 247 lb 6.4 oz (112.2 kg)  11/06/15 239 lb (108.4 kg)     Objective:    Vital Signs:  BP (!) 150/88   Pulse (!) 57   Ht 5' 7.5" (1.715 m)   Wt 244 lb (110.7 kg)   BMI 37.65 kg/m    Patient doing well without shortness of breath or cough during our encounter.  Appeared to be in no distress.   ASSESSMENT & PLAN:    1.  Permanent atrial fibrillation with chronic anticoagulation: -Patient denies palpitations, shortness of breath -Coumadin followed by PCP with last check 09/21/2018 and within normal range at 2.2 -Continue bisoprolol-HCTZ 10-6.25 mg twice daily -Coumadin regimen: 3 mg on Mondays, Wednesdays, Fridays and Sundays.  On Tuesdays and Thursdays she takes 2 mg -No reports of acute blood in stool or urine  2.  Morbid obesity: -BMI greater than 35 -Continued encouragement for weight loss, diet modifications and increased activity (however this may be difficult given that she walks with a walker and encouraged her to increase her activity within the house and in her yard).    3.  Essential hypertension: -BP noted to be moderately elevated today at 150/88 however in discussing with the patient she reports that she was rushing around getting her cuff ready  when her previsit phone call came.  She states she was moderately anxious -Last BP at her PCP in January 2020 was 138/82 this seems to be a more accurate BP reading for her -No medication changes at this time  4.  HLD: -LDL monitored by Dr. Rex Kras, patient's primary care provider -Last LDL on 07/26/2018 was 65, at goal -Continue current regimen with Crestor 10 mg 3 days/week -She has had issues with muscle discomfort with increased doses of statins -Reports this current dosing has been working great for her   COVID-19 Education: The signs and symptoms of COVID-19 were discussed with the patient and how to seek care for testing (follow up with PCP or arrange E-visit).  The importance of social distancing was discussed today.  Patient Risk:   After full review of this patient's clinical status, I feel that they are at least moderate risk at this time.  Time:   Today, I have spent 20 minutes with the patient with telehealth technology discussing chronic anticoagulation, atrial fibrillation, blood pressure and HLD assessment and medical plans   Medication Adjustments/Labs and Tests Ordered: Current medicines are reviewed at length with the patient today.  Concerns regarding medicines are outlined above.  Tests Ordered: No orders of the defined types were placed in this encounter.  Medication Changes: No orders of the defined types were placed in this encounter.   Disposition:  Follow up Dr. Marlou Porch in 1 year or sooner as needed  Signed, Kathyrn Drown, NP  10/02/2018 2:40 PM    [place]

## 2018-10-02 ENCOUNTER — Telehealth (INDEPENDENT_AMBULATORY_CARE_PROVIDER_SITE_OTHER): Payer: Medicare Other | Admitting: Cardiology

## 2018-10-02 ENCOUNTER — Other Ambulatory Visit: Payer: Self-pay

## 2018-10-02 ENCOUNTER — Encounter: Payer: Self-pay | Admitting: Cardiology

## 2018-10-02 VITALS — BP 150/88 | HR 57 | Ht 67.5 in | Wt 244.0 lb

## 2018-10-02 DIAGNOSIS — I482 Chronic atrial fibrillation, unspecified: Secondary | ICD-10-CM | POA: Diagnosis not present

## 2018-10-02 DIAGNOSIS — I10 Essential (primary) hypertension: Secondary | ICD-10-CM

## 2018-10-02 DIAGNOSIS — E785 Hyperlipidemia, unspecified: Secondary | ICD-10-CM

## 2018-10-02 DIAGNOSIS — Z7901 Long term (current) use of anticoagulants: Secondary | ICD-10-CM

## 2018-10-02 NOTE — Patient Instructions (Signed)
Medication Instructions:  Your physician recommends that you continue on your current medications as directed. Please refer to the Current Medication list given to you today.  If you need a refill on your cardiac medications before your next appointment, please call your pharmacy.   Lab work: None ordered  If you have labs (blood work) drawn today and your tests are completely normal, you will receive your results only by: Marland Kitchen MyChart Message (if you have MyChart) OR . A paper copy in the mail If you have any lab test that is abnormal or we need to change your treatment, we will call you to review the results.  Testing/Procedures: None ordered  Follow-Up: At [facility], you and your health needs are our priority.  As part of our continuing mission to provide you with exceptional heart care, we have created designated Provider Care Teams.  These Care Teams include your primary Cardiologist (physician) and Advanced Practice Providers (APPs -  Physician Assistants and Nurse Practitioners) who all work together to provide you with the care you need, when you need it. You will need a follow up appointment in 12 months.  Please call our office 2 months in advance to schedule this appointment.  You may see Candee Furbish, MD or one of the following Advanced Practice Providers on your designated Care Team:   Truitt Merle, NP Cecilie Kicks, NP . Kathyrn Drown, NP  Any Other Special Instructions Will Be Listed Below (If Applicable).

## 2018-11-09 DIAGNOSIS — Z7901 Long term (current) use of anticoagulants: Secondary | ICD-10-CM | POA: Diagnosis not present

## 2018-11-09 DIAGNOSIS — I4891 Unspecified atrial fibrillation: Secondary | ICD-10-CM | POA: Diagnosis not present

## 2018-12-27 DIAGNOSIS — Z961 Presence of intraocular lens: Secondary | ICD-10-CM | POA: Diagnosis not present

## 2018-12-27 DIAGNOSIS — H26493 Other secondary cataract, bilateral: Secondary | ICD-10-CM | POA: Diagnosis not present

## 2018-12-27 DIAGNOSIS — H40013 Open angle with borderline findings, low risk, bilateral: Secondary | ICD-10-CM | POA: Diagnosis not present

## 2018-12-27 DIAGNOSIS — R7309 Other abnormal glucose: Secondary | ICD-10-CM | POA: Diagnosis not present

## 2019-01-24 DIAGNOSIS — Z6841 Body Mass Index (BMI) 40.0 and over, adult: Secondary | ICD-10-CM | POA: Diagnosis not present

## 2019-01-24 DIAGNOSIS — R197 Diarrhea, unspecified: Secondary | ICD-10-CM | POA: Diagnosis not present

## 2019-01-24 DIAGNOSIS — N3281 Overactive bladder: Secondary | ICD-10-CM | POA: Diagnosis not present

## 2019-01-24 DIAGNOSIS — E7800 Pure hypercholesterolemia, unspecified: Secondary | ICD-10-CM | POA: Diagnosis not present

## 2019-01-24 DIAGNOSIS — R609 Edema, unspecified: Secondary | ICD-10-CM | POA: Diagnosis not present

## 2019-01-24 DIAGNOSIS — N183 Chronic kidney disease, stage 3 (moderate): Secondary | ICD-10-CM | POA: Diagnosis not present

## 2019-01-24 DIAGNOSIS — I10 Essential (primary) hypertension: Secondary | ICD-10-CM | POA: Diagnosis not present

## 2019-01-24 DIAGNOSIS — E1121 Type 2 diabetes mellitus with diabetic nephropathy: Secondary | ICD-10-CM | POA: Diagnosis not present

## 2019-01-24 DIAGNOSIS — I4891 Unspecified atrial fibrillation: Secondary | ICD-10-CM | POA: Diagnosis not present

## 2019-01-31 DIAGNOSIS — E1121 Type 2 diabetes mellitus with diabetic nephropathy: Secondary | ICD-10-CM | POA: Diagnosis not present

## 2019-01-31 DIAGNOSIS — E7800 Pure hypercholesterolemia, unspecified: Secondary | ICD-10-CM | POA: Diagnosis not present

## 2019-01-31 DIAGNOSIS — I10 Essential (primary) hypertension: Secondary | ICD-10-CM | POA: Diagnosis not present

## 2019-01-31 DIAGNOSIS — N183 Chronic kidney disease, stage 3 (moderate): Secondary | ICD-10-CM | POA: Diagnosis not present

## 2019-01-31 DIAGNOSIS — R609 Edema, unspecified: Secondary | ICD-10-CM | POA: Diagnosis not present

## 2019-01-31 DIAGNOSIS — I4891 Unspecified atrial fibrillation: Secondary | ICD-10-CM | POA: Diagnosis not present

## 2019-02-21 ENCOUNTER — Ambulatory Visit (INDEPENDENT_AMBULATORY_CARE_PROVIDER_SITE_OTHER): Payer: Medicare Other | Admitting: Nurse Practitioner

## 2019-02-21 ENCOUNTER — Encounter: Payer: Self-pay | Admitting: Nurse Practitioner

## 2019-02-21 VITALS — BP 123/70 | HR 73 | Temp 97.0°F | Ht 68.0 in | Wt 242.0 lb

## 2019-02-21 DIAGNOSIS — R194 Change in bowel habit: Secondary | ICD-10-CM | POA: Diagnosis not present

## 2019-02-21 NOTE — Progress Notes (Signed)
ASSESSMENT / PLAN:   85 year old female previously followed by Dr. Olevia Perches for IBS sending with minor bowel changes of the last year.  About once a week after breakfast patient has loose stool.  She takes 2 Imodium and bowel movements normal for the next several days until she has another bout of loose stool one morning after breakfast.   -No alarm features such as rectal bleeding, unexplained weight loss, fevers, or abdominal pain.  She actually feels quite well.  Patient feels like episodic loose stool secondary to something she is eating. She has identified some culprit foods and continues to look for others.  -Encouraged her to continue keeping a food diary.  It is fine to take a couple Imodium once a week.  She knows to contact us back for any additional changes in her bowels and/or blood in stool, unexplained weight loss, etc.   HPI:    Chief Complaint:   Bowel change  Patient is an 85 yo female with chronic AFib on coumadin, HTN and hyperlipidemia. She was previously followed by Dr. Olevia Perches for IBS, remote hx of adenomatous colon polyps, and diverticulosis,.  She aged out of surveillance colonoscopies . Last seen in 2016 for follow up of loose stool. Evaluation was negative for pancreatic insufficiency and celiac disease but trial of pancreatic enzymes was helpful. She was also advised to take daily fiber and imodium if needed.   Ms. Hochstrasser is here to discuss minor bowel changes which developed about a year or so ago.  Basically her stools are normal except for about once a week she has a loose bowel movement midmorning after breakfast.  When this happens she takes 2 Imodium and stools solidify and remain solid for the next several days until she has another bout of loose stool one morning after breakfast.  Bowel change not associated with any abdominal pain, nausea, vomiting or fever.  Her weight is stable, she feels well.  I do not see the report in epic but patient said PCP  gave her a stool test to do at home to check for blood a couple months ago and it was negative. Patient thinks episodic loose bowel movements are  food related.  She has identified some culprits including chocolate, citrus fruits and and celery.  She does not use artificial sweeteners.  She drinks tea every now and then but her coffee is decaffeinated.  She has not noticed any correlation between the loose stool and dairy.  Every day she uses half-and-half in her coffee and 2% milk in her cereal.   Past Medical History:  Diagnosis Date  . Acute blood loss anemia   . Bilateral lower extremity edema   . Chronic atrial fibrillation   . Constipation   . Diabetes ([place])    prediabetes  . GERD (gastroesophageal reflux disease)   . Hyperlipidemia   . Hypertension   . Hypokalemia   . OAB (overactive bladder)   . Osteoarthritis   . Pressure ulcer   . Protein calorie malnutrition ([place])   . Spinal stenosis, thoracic   . UTI (lower urinary tract infection)   . Weakness of both lower extremities      Past Surgical History:  Procedure Laterality Date  . BACK SURGERY    . CARPAL TUNNEL RELEASE Right   . CATARACT EXTRACTION  Apr 11, 2012, Jun 04 2012  . HIP SURGERY  restrictor in left hip due to 3 hip dislocations  . KNEE SURGERY     2 total knees -right and left  . NECK SURGERY    . THORACIC DISCECTOMY N/A 09/19/2015   Procedure: THORACIC LAMINECTOMY DECOMPRESSION T2-3, T3-4,T5-6;  Surgeon: Kevan Ny Ditty, MD;  Location: [facility];  Service: Neurosurgery;  Laterality: N/A;  THORACIC LAMINECTOMY DECOMPRESSION T2-3, T3-4,T5-6  . TONSILLECTOMY    . TOTAL HIP ARTHROPLASTY     right and left hips  . WISDOM TOOTH EXTRACTION     Family History  Problem Relation Age of Onset  . Heart disease Mother   . Colon cancer Father        dx in his late 35's, died age 30  . Uterine cancer Sister 54       spread to colon   Social History   Tobacco Use  . Smoking status: Former Smoker     Years: 20.00    Types: Cigarettes    Quit date: 09/15/1975    Years since quitting: 43.4  . Smokeless tobacco: Never Used  Substance Use Topics  . Alcohol use: Yes    Alcohol/week: 0.0 standard drinks    Comment: social  . Drug use: No   Current Outpatient Medications  Medication Sig Dispense Refill  . acetaminophen (TYLENOL) 325 MG tablet Take 2 tablets (650 mg total) by mouth every 4 (four) hours as needed (temp > 100.5). 30 tablet 0  . bisoprolol-hydrochlorothiazide (ZIAC) 10-6.25 MG per tablet Take 1 tablet by mouth 2 (two) times daily.     . cyclobenzaprine (FLEXERIL) 5 MG tablet Take 5 mg by mouth 2 (two) times daily as needed for muscle spasms.    . Ergocalciferol (VITAMIN D2) 2000 units TABS Take 1 tablet by mouth daily.    . hydrocortisone cream 1 % Apply 1 application topically daily as needed for itching.    . loperamide (IMODIUM A-D) 2 MG tablet Take 4 mg by mouth 4 (four) times daily as needed for diarrhea or loose stools.    Marland Kitchen losartan (COZAAR) 100 MG tablet Take 100 mg by mouth daily.    Marland Kitchen oxybutynin (DITROPAN-XL) 5 MG 24 hr tablet Take 5 mg by mouth daily.    . ranitidine (ZANTAC) 75 MG tablet Take 75 mg by mouth daily as needed for heartburn.    . rosuvastatin (CRESTOR) 10 MG tablet Take 10 mg by mouth. M-W-F at bedtime    . warfarin (COUMADIN) 2 MG tablet Take 1.5 tablets (3 mg) by mouth daily on Mondays, Wednesdays, Fidays, and Sundays. Take 1 tablet (2 mg) by mouth daily on Tuesdays, Thursdays, and Saturdays.  0   No current facility-administered medications for this visit.    Allergies  Allergen Reactions  . Nsaids     On coumadin  . Statins     Cause leg cramps     Review of Systems: Positive for swelling of feet and legs, excessive urination and urine leakage . All systems reviewed and negative except where noted in HPI.    Physical Exam:    Wt Readings from Last 3 Encounters:  02/21/19 242 lb (109.8 kg)  10/02/18 244 lb (110.7 kg)  08/10/17 247 lb  6.4 oz (112.2 kg)    BP 123/70   Pulse 73   Temp (!) 97 F (36.1 C)   Ht 5\' 8"  (1.727 m)   Wt 242 lb (109.8 kg)   BMI 36.80 kg/m  Constitutional:  Pleasant female in no acute distress.  Psychiatric: Normal mood and affect. Behavior is normal. EENT: Pupils normal.  Conjunctivae are normal. No scleral icterus. Neck supple.  Cardiovascular: Normal rate, regular rhythm. No edema Pulmonary/chest: Effort normal and breath sounds normal. No wheezing, rales or rhonchi. Abdominal: Soft, nondistended, nontender. Bowel sounds active throughout.  Neurological: Alert and oriented to person place and time. Skin: Skin is warm and dry. No rashes noted.  Tye Savoy, NP  02/21/2019, 12:36 PM

## 2019-02-21 NOTE — Patient Instructions (Signed)
If you are age 83 or older, your body mass index should be between 23-30. Your Body mass index is 36.8 kg/m. If this is out of the aforementioned range listed, please consider follow up with your Primary Care Provider.  If you are age 57 or younger, your body mass index should be between 19-25. Your Body mass index is 36.8 kg/m. If this is out of the aformentioned range listed, please consider follow up with your Primary Care Provider.   Continue food diary.  Call if diarrhea progresses.  Follow up as needed.  Thank you for choosing me and [facility].   Tye Savoy, NP

## 2019-02-21 NOTE — Progress Notes (Signed)
I agree with the above note, plan 

## 2019-02-23 DIAGNOSIS — B029 Zoster without complications: Secondary | ICD-10-CM | POA: Diagnosis not present

## 2019-02-28 DIAGNOSIS — I10 Essential (primary) hypertension: Secondary | ICD-10-CM | POA: Diagnosis not present

## 2019-02-28 DIAGNOSIS — E1121 Type 2 diabetes mellitus with diabetic nephropathy: Secondary | ICD-10-CM | POA: Diagnosis not present

## 2019-02-28 DIAGNOSIS — I4891 Unspecified atrial fibrillation: Secondary | ICD-10-CM | POA: Diagnosis not present

## 2019-02-28 DIAGNOSIS — N183 Chronic kidney disease, stage 3 (moderate): Secondary | ICD-10-CM | POA: Diagnosis not present

## 2019-02-28 DIAGNOSIS — E7800 Pure hypercholesterolemia, unspecified: Secondary | ICD-10-CM | POA: Diagnosis not present

## 2019-03-28 DIAGNOSIS — Z7901 Long term (current) use of anticoagulants: Secondary | ICD-10-CM | POA: Diagnosis not present

## 2019-03-28 DIAGNOSIS — Z23 Encounter for immunization: Secondary | ICD-10-CM | POA: Diagnosis not present

## 2019-03-28 DIAGNOSIS — I4891 Unspecified atrial fibrillation: Secondary | ICD-10-CM | POA: Diagnosis not present

## 2019-05-16 DIAGNOSIS — I4891 Unspecified atrial fibrillation: Secondary | ICD-10-CM | POA: Diagnosis not present

## 2019-05-16 DIAGNOSIS — Z7901 Long term (current) use of anticoagulants: Secondary | ICD-10-CM | POA: Diagnosis not present

## 2019-05-18 DIAGNOSIS — I10 Essential (primary) hypertension: Secondary | ICD-10-CM | POA: Diagnosis not present

## 2019-05-18 DIAGNOSIS — E7800 Pure hypercholesterolemia, unspecified: Secondary | ICD-10-CM | POA: Diagnosis not present

## 2019-05-18 DIAGNOSIS — E1121 Type 2 diabetes mellitus with diabetic nephropathy: Secondary | ICD-10-CM | POA: Diagnosis not present

## 2019-05-18 DIAGNOSIS — N183 Chronic kidney disease, stage 3 unspecified: Secondary | ICD-10-CM | POA: Diagnosis not present

## 2019-05-18 DIAGNOSIS — I4891 Unspecified atrial fibrillation: Secondary | ICD-10-CM | POA: Diagnosis not present

## 2019-06-25 DIAGNOSIS — Z7901 Long term (current) use of anticoagulants: Secondary | ICD-10-CM | POA: Diagnosis not present

## 2019-06-25 DIAGNOSIS — T148XXA Other injury of unspecified body region, initial encounter: Secondary | ICD-10-CM | POA: Diagnosis not present

## 2019-06-25 DIAGNOSIS — S99929A Unspecified injury of unspecified foot, initial encounter: Secondary | ICD-10-CM | POA: Diagnosis not present

## 2019-07-04 DIAGNOSIS — I4891 Unspecified atrial fibrillation: Secondary | ICD-10-CM | POA: Diagnosis not present

## 2019-07-04 DIAGNOSIS — Z7901 Long term (current) use of anticoagulants: Secondary | ICD-10-CM | POA: Diagnosis not present

## 2019-07-21 DIAGNOSIS — E1121 Type 2 diabetes mellitus with diabetic nephropathy: Secondary | ICD-10-CM | POA: Diagnosis not present

## 2019-07-21 DIAGNOSIS — E7800 Pure hypercholesterolemia, unspecified: Secondary | ICD-10-CM | POA: Diagnosis not present

## 2019-07-21 DIAGNOSIS — N183 Chronic kidney disease, stage 3 unspecified: Secondary | ICD-10-CM | POA: Diagnosis not present

## 2019-07-21 DIAGNOSIS — I4891 Unspecified atrial fibrillation: Secondary | ICD-10-CM | POA: Diagnosis not present

## 2019-07-21 DIAGNOSIS — I10 Essential (primary) hypertension: Secondary | ICD-10-CM | POA: Diagnosis not present

## 2019-07-24 DIAGNOSIS — M25562 Pain in left knee: Secondary | ICD-10-CM | POA: Diagnosis not present

## 2019-07-24 DIAGNOSIS — M25561 Pain in right knee: Secondary | ICD-10-CM | POA: Diagnosis not present

## 2019-07-24 DIAGNOSIS — M79672 Pain in left foot: Secondary | ICD-10-CM | POA: Diagnosis not present

## 2019-07-27 ENCOUNTER — Ambulatory Visit: Payer: Medicare Other

## 2019-08-01 DIAGNOSIS — I4891 Unspecified atrial fibrillation: Secondary | ICD-10-CM | POA: Diagnosis not present

## 2019-08-01 DIAGNOSIS — I10 Essential (primary) hypertension: Secondary | ICD-10-CM | POA: Diagnosis not present

## 2019-08-01 DIAGNOSIS — E1121 Type 2 diabetes mellitus with diabetic nephropathy: Secondary | ICD-10-CM | POA: Diagnosis not present

## 2019-08-01 DIAGNOSIS — N183 Chronic kidney disease, stage 3 unspecified: Secondary | ICD-10-CM | POA: Diagnosis not present

## 2019-08-01 DIAGNOSIS — E7800 Pure hypercholesterolemia, unspecified: Secondary | ICD-10-CM | POA: Diagnosis not present

## 2019-08-02 ENCOUNTER — Ambulatory Visit: Payer: Medicare Other | Attending: Internal Medicine

## 2019-08-02 DIAGNOSIS — Z23 Encounter for immunization: Secondary | ICD-10-CM | POA: Insufficient documentation

## 2019-08-02 NOTE — Progress Notes (Signed)
   Covid-19 Vaccination Clinic  Name:  [name] [name]    MRN: [id number] DOB: Apr 27, 1934  08/02/2019  [name] [name] was observed post Covid-19 immunization for 30 minutes based on pre-vaccination screening without incidence. She was provided with Vaccine Information Sheet and instruction to access the V-Safe system.   [name] [name] was instructed to call 911 with any severe reactions post vaccine: Marland Kitchen Difficulty breathing  . Swelling of your face and throat  . A fast heartbeat  . A bad rash all over your body  . Dizziness and weakness    Immunizations Administered    Name Date Dose VIS Date Route   Pfizer COVID-19 Vaccine 08/02/2019  1:35 PM 0.3 mL 06/08/2019 Intramuscular   Manufacturer: [place]   Lot: [id number]   [place]: [id number]

## 2019-08-13 ENCOUNTER — Ambulatory Visit: Payer: Medicare Other

## 2019-08-27 ENCOUNTER — Ambulatory Visit: Payer: Medicare Other | Attending: Internal Medicine

## 2019-08-27 DIAGNOSIS — Z23 Encounter for immunization: Secondary | ICD-10-CM | POA: Insufficient documentation

## 2019-08-27 NOTE — Progress Notes (Signed)
   Covid-19 Vaccination Clinic  Name:  Ariena Mossholder    MRN: [id number] DOB: 1934-06-16  08/27/2019  Ms. Goley was observed post Covid-19 immunization for 15 minutes without incidence. She was provided with Vaccine Information Sheet and instruction to access the V-Safe system.   Ms. Osuch was instructed to call 911 with any severe reactions post vaccine: Marland Kitchen Difficulty breathing  . Swelling of your face and throat  . A fast heartbeat  . A bad rash all over your body  . Dizziness and weakness    Immunizations Administered    Name Date Dose VIS Date Route   Pfizer COVID-19 Vaccine 08/27/2019  4:15 PM 0.3 mL 06/08/2019 Intramuscular   Manufacturer: [place]   Lot: [id number]   [place]: [id number]

## 2019-08-28 DIAGNOSIS — Z7901 Long term (current) use of anticoagulants: Secondary | ICD-10-CM | POA: Diagnosis not present

## 2019-09-03 DIAGNOSIS — E7800 Pure hypercholesterolemia, unspecified: Secondary | ICD-10-CM | POA: Diagnosis not present

## 2019-09-03 DIAGNOSIS — E1121 Type 2 diabetes mellitus with diabetic nephropathy: Secondary | ICD-10-CM | POA: Diagnosis not present

## 2019-09-03 DIAGNOSIS — N183 Chronic kidney disease, stage 3 unspecified: Secondary | ICD-10-CM | POA: Diagnosis not present

## 2019-09-03 DIAGNOSIS — I10 Essential (primary) hypertension: Secondary | ICD-10-CM | POA: Diagnosis not present

## 2019-09-03 DIAGNOSIS — I4891 Unspecified atrial fibrillation: Secondary | ICD-10-CM | POA: Diagnosis not present

## 2019-09-06 DIAGNOSIS — I4891 Unspecified atrial fibrillation: Secondary | ICD-10-CM | POA: Diagnosis not present

## 2019-09-06 DIAGNOSIS — Z7901 Long term (current) use of anticoagulants: Secondary | ICD-10-CM | POA: Diagnosis not present

## 2019-09-13 DIAGNOSIS — E1121 Type 2 diabetes mellitus with diabetic nephropathy: Secondary | ICD-10-CM | POA: Diagnosis not present

## 2019-09-13 DIAGNOSIS — I10 Essential (primary) hypertension: Secondary | ICD-10-CM | POA: Diagnosis not present

## 2019-09-13 DIAGNOSIS — I4891 Unspecified atrial fibrillation: Secondary | ICD-10-CM | POA: Diagnosis not present

## 2019-09-13 DIAGNOSIS — Z7901 Long term (current) use of anticoagulants: Secondary | ICD-10-CM | POA: Diagnosis not present

## 2019-09-13 DIAGNOSIS — M791 Myalgia, unspecified site: Secondary | ICD-10-CM | POA: Diagnosis not present

## 2019-10-01 DIAGNOSIS — Z7901 Long term (current) use of anticoagulants: Secondary | ICD-10-CM | POA: Diagnosis not present

## 2019-10-02 DIAGNOSIS — L570 Actinic keratosis: Secondary | ICD-10-CM | POA: Diagnosis not present

## 2019-10-02 DIAGNOSIS — L578 Other skin changes due to chronic exposure to nonionizing radiation: Secondary | ICD-10-CM | POA: Diagnosis not present

## 2019-10-02 DIAGNOSIS — D1801 Hemangioma of skin and subcutaneous tissue: Secondary | ICD-10-CM | POA: Diagnosis not present

## 2019-10-26 ENCOUNTER — Other Ambulatory Visit: Payer: Self-pay | Admitting: Family Medicine

## 2019-10-26 DIAGNOSIS — R1030 Lower abdominal pain, unspecified: Secondary | ICD-10-CM

## 2019-10-26 DIAGNOSIS — E876 Hypokalemia: Secondary | ICD-10-CM | POA: Diagnosis not present

## 2019-10-26 DIAGNOSIS — Z7901 Long term (current) use of anticoagulants: Secondary | ICD-10-CM | POA: Diagnosis not present

## 2019-10-26 DIAGNOSIS — R8290 Unspecified abnormal findings in urine: Secondary | ICD-10-CM | POA: Diagnosis not present

## 2019-10-29 ENCOUNTER — Other Ambulatory Visit: Payer: Self-pay

## 2019-10-29 ENCOUNTER — Ambulatory Visit
Admission: RE | Admit: 2019-10-29 | Discharge: 2019-10-29 | Disposition: A | Discharge status: Home / Self Care | Payer: Medicare Other | Source: Ambulatory Visit | Attending: Family Medicine | Admitting: Family Medicine

## 2019-10-29 DIAGNOSIS — K5730 Diverticulosis of large intestine without perforation or abscess without bleeding: Secondary | ICD-10-CM | POA: Diagnosis not present

## 2019-10-29 DIAGNOSIS — K449 Diaphragmatic hernia without obstruction or gangrene: Secondary | ICD-10-CM | POA: Diagnosis not present

## 2019-10-29 DIAGNOSIS — R1030 Lower abdominal pain, unspecified: Secondary | ICD-10-CM

## 2019-10-29 MED ORDER — IOPAMIDOL (ISOVUE-300) INJECTION 61%
125.0000 mL | Freq: Once | INTRAVENOUS | Status: AC | PRN
  Administered 2019-10-29: 125 mL via INTRAVENOUS

## 2019-10-31 DIAGNOSIS — Z7901 Long term (current) use of anticoagulants: Secondary | ICD-10-CM | POA: Diagnosis not present

## 2019-10-31 DIAGNOSIS — I4891 Unspecified atrial fibrillation: Secondary | ICD-10-CM | POA: Diagnosis not present

## 2019-11-05 DIAGNOSIS — E7800 Pure hypercholesterolemia, unspecified: Secondary | ICD-10-CM | POA: Diagnosis not present

## 2019-11-05 DIAGNOSIS — I4891 Unspecified atrial fibrillation: Secondary | ICD-10-CM | POA: Diagnosis not present

## 2019-11-05 DIAGNOSIS — I10 Essential (primary) hypertension: Secondary | ICD-10-CM | POA: Diagnosis not present

## 2019-11-05 DIAGNOSIS — N183 Chronic kidney disease, stage 3 unspecified: Secondary | ICD-10-CM | POA: Diagnosis not present

## 2019-11-05 DIAGNOSIS — E1121 Type 2 diabetes mellitus with diabetic nephropathy: Secondary | ICD-10-CM | POA: Diagnosis not present

## 2019-11-09 DIAGNOSIS — I4891 Unspecified atrial fibrillation: Secondary | ICD-10-CM | POA: Diagnosis not present

## 2019-11-09 DIAGNOSIS — Z7901 Long term (current) use of anticoagulants: Secondary | ICD-10-CM | POA: Diagnosis not present

## 2019-11-15 DIAGNOSIS — Z7901 Long term (current) use of anticoagulants: Secondary | ICD-10-CM | POA: Diagnosis not present

## 2019-11-15 DIAGNOSIS — I4891 Unspecified atrial fibrillation: Secondary | ICD-10-CM | POA: Diagnosis not present

## 2019-12-07 DIAGNOSIS — Z7901 Long term (current) use of anticoagulants: Secondary | ICD-10-CM | POA: Diagnosis not present

## 2019-12-28 DIAGNOSIS — E7800 Pure hypercholesterolemia, unspecified: Secondary | ICD-10-CM | POA: Diagnosis not present

## 2019-12-28 DIAGNOSIS — N183 Chronic kidney disease, stage 3 unspecified: Secondary | ICD-10-CM | POA: Diagnosis not present

## 2019-12-28 DIAGNOSIS — I4891 Unspecified atrial fibrillation: Secondary | ICD-10-CM | POA: Diagnosis not present

## 2019-12-28 DIAGNOSIS — E1121 Type 2 diabetes mellitus with diabetic nephropathy: Secondary | ICD-10-CM | POA: Diagnosis not present

## 2019-12-28 DIAGNOSIS — I10 Essential (primary) hypertension: Secondary | ICD-10-CM | POA: Diagnosis not present

## 2020-01-10 DIAGNOSIS — I4891 Unspecified atrial fibrillation: Secondary | ICD-10-CM | POA: Diagnosis not present

## 2020-01-10 DIAGNOSIS — Z7901 Long term (current) use of anticoagulants: Secondary | ICD-10-CM | POA: Diagnosis not present

## 2020-01-17 DIAGNOSIS — Z7901 Long term (current) use of anticoagulants: Secondary | ICD-10-CM | POA: Diagnosis not present

## 2020-02-08 DIAGNOSIS — Z7901 Long term (current) use of anticoagulants: Secondary | ICD-10-CM | POA: Diagnosis not present

## 2020-02-08 DIAGNOSIS — I4891 Unspecified atrial fibrillation: Secondary | ICD-10-CM | POA: Diagnosis not present

## 2020-02-15 DIAGNOSIS — Z7901 Long term (current) use of anticoagulants: Secondary | ICD-10-CM | POA: Diagnosis not present

## 2020-02-15 DIAGNOSIS — I4891 Unspecified atrial fibrillation: Secondary | ICD-10-CM | POA: Diagnosis not present

## 2020-02-28 DIAGNOSIS — I4891 Unspecified atrial fibrillation: Secondary | ICD-10-CM | POA: Diagnosis not present

## 2020-02-28 DIAGNOSIS — Z7901 Long term (current) use of anticoagulants: Secondary | ICD-10-CM | POA: Diagnosis not present

## 2020-03-07 DIAGNOSIS — E7800 Pure hypercholesterolemia, unspecified: Secondary | ICD-10-CM | POA: Diagnosis not present

## 2020-03-07 DIAGNOSIS — E1121 Type 2 diabetes mellitus with diabetic nephropathy: Secondary | ICD-10-CM | POA: Diagnosis not present

## 2020-03-07 DIAGNOSIS — I10 Essential (primary) hypertension: Secondary | ICD-10-CM | POA: Diagnosis not present

## 2020-03-07 DIAGNOSIS — N183 Chronic kidney disease, stage 3 unspecified: Secondary | ICD-10-CM | POA: Diagnosis not present

## 2020-03-07 DIAGNOSIS — I4891 Unspecified atrial fibrillation: Secondary | ICD-10-CM | POA: Diagnosis not present

## 2020-03-28 DIAGNOSIS — I4891 Unspecified atrial fibrillation: Secondary | ICD-10-CM | POA: Diagnosis not present

## 2020-03-28 DIAGNOSIS — Z7901 Long term (current) use of anticoagulants: Secondary | ICD-10-CM | POA: Diagnosis not present

## 2020-04-03 DIAGNOSIS — I4891 Unspecified atrial fibrillation: Secondary | ICD-10-CM | POA: Diagnosis not present

## 2020-04-03 DIAGNOSIS — Z7901 Long term (current) use of anticoagulants: Secondary | ICD-10-CM | POA: Diagnosis not present

## 2020-04-11 DIAGNOSIS — Z7901 Long term (current) use of anticoagulants: Secondary | ICD-10-CM | POA: Diagnosis not present

## 2020-04-11 DIAGNOSIS — I4891 Unspecified atrial fibrillation: Secondary | ICD-10-CM | POA: Diagnosis not present

## 2020-04-23 DIAGNOSIS — Z23 Encounter for immunization: Secondary | ICD-10-CM | POA: Diagnosis not present

## 2020-04-23 DIAGNOSIS — I10 Essential (primary) hypertension: Secondary | ICD-10-CM | POA: Diagnosis not present

## 2020-04-23 DIAGNOSIS — R748 Abnormal levels of other serum enzymes: Secondary | ICD-10-CM | POA: Diagnosis not present

## 2020-04-23 DIAGNOSIS — E7800 Pure hypercholesterolemia, unspecified: Secondary | ICD-10-CM | POA: Diagnosis not present

## 2020-04-23 DIAGNOSIS — I700 Atherosclerosis of aorta: Secondary | ICD-10-CM | POA: Diagnosis not present

## 2020-04-23 DIAGNOSIS — Z6836 Body mass index (BMI) 36.0-36.9, adult: Secondary | ICD-10-CM | POA: Diagnosis not present

## 2020-04-23 DIAGNOSIS — E876 Hypokalemia: Secondary | ICD-10-CM | POA: Diagnosis not present

## 2020-04-23 DIAGNOSIS — N183 Chronic kidney disease, stage 3 unspecified: Secondary | ICD-10-CM | POA: Diagnosis not present

## 2020-04-23 DIAGNOSIS — E1121 Type 2 diabetes mellitus with diabetic nephropathy: Secondary | ICD-10-CM | POA: Diagnosis not present

## 2020-04-23 DIAGNOSIS — I4891 Unspecified atrial fibrillation: Secondary | ICD-10-CM | POA: Diagnosis not present

## 2020-04-30 DIAGNOSIS — Z23 Encounter for immunization: Secondary | ICD-10-CM | POA: Diagnosis not present

## 2020-05-06 DIAGNOSIS — Z961 Presence of intraocular lens: Secondary | ICD-10-CM | POA: Diagnosis not present

## 2020-05-06 DIAGNOSIS — E7800 Pure hypercholesterolemia, unspecified: Secondary | ICD-10-CM | POA: Diagnosis not present

## 2020-05-06 DIAGNOSIS — H40013 Open angle with borderline findings, low risk, bilateral: Secondary | ICD-10-CM | POA: Diagnosis not present

## 2020-05-06 DIAGNOSIS — E1121 Type 2 diabetes mellitus with diabetic nephropathy: Secondary | ICD-10-CM | POA: Diagnosis not present

## 2020-05-06 DIAGNOSIS — I4891 Unspecified atrial fibrillation: Secondary | ICD-10-CM | POA: Diagnosis not present

## 2020-05-06 DIAGNOSIS — R7309 Other abnormal glucose: Secondary | ICD-10-CM | POA: Diagnosis not present

## 2020-05-06 DIAGNOSIS — H26493 Other secondary cataract, bilateral: Secondary | ICD-10-CM | POA: Diagnosis not present

## 2020-05-06 DIAGNOSIS — N183 Chronic kidney disease, stage 3 unspecified: Secondary | ICD-10-CM | POA: Diagnosis not present

## 2020-05-06 DIAGNOSIS — I10 Essential (primary) hypertension: Secondary | ICD-10-CM | POA: Diagnosis not present

## 2020-05-06 DIAGNOSIS — G8929 Other chronic pain: Secondary | ICD-10-CM | POA: Diagnosis not present

## 2020-05-09 DIAGNOSIS — Z7901 Long term (current) use of anticoagulants: Secondary | ICD-10-CM | POA: Diagnosis not present

## 2020-05-09 DIAGNOSIS — E1121 Type 2 diabetes mellitus with diabetic nephropathy: Secondary | ICD-10-CM | POA: Diagnosis not present

## 2020-05-16 DIAGNOSIS — L570 Actinic keratosis: Secondary | ICD-10-CM | POA: Diagnosis not present

## 2020-05-16 DIAGNOSIS — D0472 Carcinoma in situ of skin of left lower limb, including hip: Secondary | ICD-10-CM | POA: Diagnosis not present

## 2020-05-16 DIAGNOSIS — C44722 Squamous cell carcinoma of skin of right lower limb, including hip: Secondary | ICD-10-CM | POA: Diagnosis not present

## 2020-05-16 DIAGNOSIS — D1801 Hemangioma of skin and subcutaneous tissue: Secondary | ICD-10-CM | POA: Diagnosis not present

## 2020-05-16 DIAGNOSIS — D485 Neoplasm of uncertain behavior of skin: Secondary | ICD-10-CM | POA: Diagnosis not present

## 2020-06-04 DIAGNOSIS — D6869 Other thrombophilia: Secondary | ICD-10-CM | POA: Diagnosis not present

## 2020-06-04 DIAGNOSIS — Z7901 Long term (current) use of anticoagulants: Secondary | ICD-10-CM | POA: Diagnosis not present

## 2020-06-04 DIAGNOSIS — I4891 Unspecified atrial fibrillation: Secondary | ICD-10-CM | POA: Diagnosis not present

## 2020-06-11 ENCOUNTER — Encounter: Payer: Self-pay | Admitting: Cardiology

## 2020-06-11 ENCOUNTER — Other Ambulatory Visit: Payer: Self-pay

## 2020-06-11 ENCOUNTER — Ambulatory Visit (INDEPENDENT_AMBULATORY_CARE_PROVIDER_SITE_OTHER): Payer: Medicare Other | Admitting: Cardiology

## 2020-06-11 VITALS — BP 120/70 | HR 73 | Ht 68.0 in | Wt 231.0 lb

## 2020-06-11 DIAGNOSIS — I10 Essential (primary) hypertension: Secondary | ICD-10-CM

## 2020-06-11 DIAGNOSIS — I4819 Other persistent atrial fibrillation: Secondary | ICD-10-CM

## 2020-06-11 NOTE — Patient Instructions (Signed)
Medication Instructions:  The current medical regimen is effective;  continue present plan and medications.  *If you need a refill on your cardiac medications before your next appointment, please call your pharmacy*  Follow-Up: At [facility], you and your health needs are our priority.  As part of our continuing mission to provide you with exceptional heart care, we have created designated Provider Care Teams.  These Care Teams include your primary Cardiologist (physician) and Advanced Practice Providers (APPs -  Physician Assistants and Nurse Practitioners) who all work together to provide you with the care you need, when you need it.  We recommend signing up for the patient portal called "MyChart".  Sign up information is provided on this After Visit Summary.  MyChart is used to connect with patients for Virtual Visits (Telemedicine).  Patients are able to view lab/test results, encounter notes, upcoming appointments, etc.  Non-urgent messages can be sent to your provider as well.   To learn more about what you can do with MyChart, go to [email or web address].    Your next appointment:   12 month(s)  The format for your next appointment:   In Person  Provider:   Mark Skains, MD   Thank you for choosing [facility]!!      

## 2020-06-11 NOTE — Progress Notes (Signed)
Cardiology Office Note:    Date:  06/11/2020   ID:  [name] [name], DOB 18-Apr-1934, MRN [id number]  PCP:  Hulan Fess, MD  [facility] Cardiologist:  Candee Furbish, MD  [facility] Electrophysiologist:  None   Referring MD: Hulan Fess, MD     History of Present Illness:    [name] [name] is a 86 y.o. female for follow-up of persistent atrial fibrillation.  Overall been doing well with regards to A. fib.  Well rate controlled.  On Coumadin.  No bleeding.  Last hemoglobin 14.2, creatinine 1.08.  Dr. Rex Kras has been excellently managing her Coumadin.  Had problems with GI bleed with Xarelto in the past.  Continues to use walker for ambulation.  Recently moved from her home of several years in [place] to a small [place] in [place].  Her daughter lives in the [place].  Past Medical History:  Diagnosis Date  . Acute blood loss anemia   . Bilateral lower extremity edema   . Chronic atrial fibrillation ([place])   . Constipation   . Diabetes ([place])    prediabetes  . GERD (gastroesophageal reflux disease)   . Hyperlipidemia   . Hypertension   . Hypokalemia   . OAB (overactive bladder)   . Osteoarthritis   . Pressure ulcer   . Protein calorie malnutrition ([place])   . Spinal stenosis, thoracic   . UTI (lower urinary tract infection)   . Weakness of both lower extremities     Past Surgical History:  Procedure Laterality Date  . BACK SURGERY    . CARPAL TUNNEL RELEASE Right   . CATARACT EXTRACTION  Apr 11, 2012, Jun 04 2012  . HIP SURGERY     restrictor in left hip due to 3 hip dislocations  . KNEE SURGERY     2 total knees -right and left  . NECK SURGERY    . THORACIC DISCECTOMY N/A 09/19/2015   Procedure: THORACIC LAMINECTOMY DECOMPRESSION T2-3, T3-4,T5-6;  Surgeon: Kevan Ny Ditty, MD;  Location: [place] [facility];  Service: Neurosurgery;  Laterality: N/A;  THORACIC LAMINECTOMY DECOMPRESSION T2-3, T3-4,T5-6  . TONSILLECTOMY    . TOTAL HIP  ARTHROPLASTY     right and left hips  . WISDOM TOOTH EXTRACTION      Current Medications: Current Meds  Medication Sig  . acetaminophen (TYLENOL) 325 MG tablet Take 2 tablets (650 mg total) by mouth every 4 (four) hours as needed (temp > 100.5).  . bisoprolol-hydrochlorothiazide (ZIAC) 10-6.25 MG per tablet Take 1 tablet by mouth 2 (two) times daily.   . cyclobenzaprine (FLEXERIL) 5 MG tablet Take 5 mg by mouth 2 (two) times daily as needed for muscle spasms.  . Ergocalciferol (VITAMIN D2) 2000 units TABS Take 1 tablet by mouth daily.  . hydrocortisone cream 1 % Apply 1 application topically daily as needed for itching.  . loperamide (IMODIUM A-D) 2 MG tablet Take 4 mg by mouth 4 (four) times daily as needed for diarrhea or loose stools.  Marland Kitchen losartan (COZAAR) 100 MG tablet Take 100 mg by mouth daily.  Marland Kitchen oxybutynin (DITROPAN-XL) 5 MG 24 hr tablet Take 5 mg by mouth daily.  . potassium chloride (KLOR-CON) 10 MEQ tablet Take 10 mEq by mouth daily.  . pravastatin (PRAVACHOL) 40 MG tablet Take 40 mg by mouth daily.  . ranitidine (ZANTAC) 75 MG tablet Take 75 mg by mouth daily as needed for heartburn.  . rosuvastatin (CRESTOR) 10 MG tablet Take 10 mg by mouth. M-W-F at  bedtime  . telmisartan (MICARDIS) 80 MG tablet Take 80 mg by mouth daily.  Marland Kitchen warfarin (COUMADIN) 2 MG tablet Take 1.5 tablets (3 mg) by mouth daily     Allergies:   Nsaids and Statins   Social History   Socioeconomic History  . Marital status: Divorced    Spouse name: Not on file  . Number of children: 3  . Years of education: Not on file  . Highest education level: Not on file  Occupational History  . Occupation: retired  Tobacco Use  . Smoking status: Former Smoker    Years: 20.00    Types: Cigarettes    Quit date: 09/15/1975    Years since quitting: 44.7  . Smokeless tobacco: Never Used  Substance and Sexual Activity  . Alcohol use: Yes    Alcohol/week: 0.0 standard drinks    Comment: social  . Drug use: No   . Sexual activity: Not on file  Other Topics Concern  . Not on file  Social History Narrative   Tobacco use   Cigarettes: Former smoker   Pack- year Hx 20   No smoking, History of smoking   Alcohol : yes , occasionally    Occupation : retired Cabin crew   Marital status Divorced   Children 3      Social Determinants of Radio broadcast assistant Strain: Not on file  Food Insecurity: Not on file  Transportation Needs: Not on file  Physical Activity: Not on file  Stress: Not on file  Social Connections: Not on file     Family History: The patient's family history includes Colon cancer in her father; Heart disease in her mother; Uterine cancer (age of onset: 44) in her sister.  ROS:   Please see the history of present illness.     All other systems reviewed and are negative.  EKGs/Labs/Other Studies Reviewed:    The following studies were reviewed today:  ECHO 2013:  - Procedure narrative: Transthoracic echocardiography. Image  quality was suboptimal. The study was technically  difficult, as a result of poor sound wave transmission.  - Left ventricle: Systolic function was normal. The  estimated ejection fraction was in the range of 55% to  60%.  - Mitral valve: Calcified annulus. Mildly thickened leaflets  .  - Left atrium: The atrium was mildly dilated.  - Right atrium: The atrium was mildly dilated.    EKG:  EKG is  ordered today.  The ekg ordered today demonstrates AFIB 73  Recent Labs: No results found for requested labs within last 8760 hours.  Recent Lipid Panel No results found for: CHOL, TRIG, HDL, CHOLHDL, VLDL, LDLCALC, LDLDIRECT    Physical Exam:    VS:  BP 120/70 (BP Location: Left Arm, Patient Position: Sitting, Cuff Size: Normal)   Pulse 73   Ht 5\' 8"  (1.727 m)   Wt 231 lb (104.8 kg)   SpO2 98%   BMI 35.12 kg/m     Wt Readings from Last 3 Encounters:  06/11/20 231 lb (104.8 kg)  02/21/19 242 lb (109.8 kg)  10/02/18 244 lb  (110.7 kg)     GEN: Well nourished, well developed in no acute distress HEENT: Normal NECK: No JVD; No carotid bruits LYMPHATICS: No lymphadenopathy CARDIAC: irreg normal rate, no murmurs, rubs, gallops RESPIRATORY:  Clear to auscultation without rales, wheezing or rhonchi  ABDOMEN: Soft, non-tender, non-distended MUSCULOSKELETAL:  No edema; No deformity  SKIN: Warm and dry NEUROLOGIC:  Alert and oriented x 3 PSYCHIATRIC:  Normal affect   ASSESSMENT:    1. Persistent atrial fibrillation ([place])   2. Essential hypertension    PLAN:    In order of problems listed above:  Permanent atrial fibrillation -Overall doing very well with rate control.  No changes made.  Continue with bisoprolol hydrochlorothiazide medication management.  Chronic anticoagulation -Followed closely by Dr. Rex Kras.  Essential hypertension -Excellent control, also on losartan 100.  Hyperlipidemia -Taking Crestor Monday Wednesday Friday 10 mg.  Last LDL was 91 less than 100.  Had trouble tolerating higher dosing.  We will see back in 1 year.        Medication Adjustments/Labs and Tests Ordered: Current medicines are reviewed at length with the patient today.  Concerns regarding medicines are outlined above.  Orders Placed This Encounter  Procedures  . EKG 12-Lead   No orders of the defined types were placed in this encounter.   Patient Instructions  Medication Instructions:  The current medical regimen is effective;  continue present plan and medications.  *If you need a refill on your cardiac medications before your next appointment, please call your pharmacy*  Follow-Up: At [facility], you and your health needs are our priority.  As part of our continuing mission to provide you with exceptional heart care, we have created designated Provider Care Teams.  These Care Teams include your primary Cardiologist (physician) and Advanced Practice Providers (APPs -  Physician Assistants and Nurse  Practitioners) who all work together to provide you with the care you need, when you need it.  We recommend signing up for the patient portal called "MyChart".  Sign up information is provided on this After Visit Summary.  MyChart is used to connect with patients for Virtual Visits (Telemedicine).  Patients are able to view lab/test results, encounter notes, upcoming appointments, etc.  Non-urgent messages can be sent to your provider as well.   To learn more about what you can do with MyChart, go to [email or web address].    Your next appointment:   12 month(s)  The format for your next appointment:   In Person  Provider:   Candee Furbish, MD   Thank you for choosing [facility]!!         Signed, Candee Furbish, MD  06/11/2020 4:49 PM    [place]

## 2020-07-04 DIAGNOSIS — I4891 Unspecified atrial fibrillation: Secondary | ICD-10-CM | POA: Diagnosis not present

## 2020-07-04 DIAGNOSIS — Z7901 Long term (current) use of anticoagulants: Secondary | ICD-10-CM | POA: Diagnosis not present

## 2020-07-04 DIAGNOSIS — D6869 Other thrombophilia: Secondary | ICD-10-CM | POA: Diagnosis not present

## 2020-07-25 DIAGNOSIS — I10 Essential (primary) hypertension: Secondary | ICD-10-CM | POA: Diagnosis not present

## 2020-07-25 DIAGNOSIS — I4891 Unspecified atrial fibrillation: Secondary | ICD-10-CM | POA: Diagnosis not present

## 2020-07-25 DIAGNOSIS — N183 Chronic kidney disease, stage 3 unspecified: Secondary | ICD-10-CM | POA: Diagnosis not present

## 2020-07-25 DIAGNOSIS — G8929 Other chronic pain: Secondary | ICD-10-CM | POA: Diagnosis not present

## 2020-07-25 DIAGNOSIS — E1121 Type 2 diabetes mellitus with diabetic nephropathy: Secondary | ICD-10-CM | POA: Diagnosis not present

## 2020-07-25 DIAGNOSIS — E7800 Pure hypercholesterolemia, unspecified: Secondary | ICD-10-CM | POA: Diagnosis not present

## 2020-08-07 DIAGNOSIS — Z7901 Long term (current) use of anticoagulants: Secondary | ICD-10-CM | POA: Diagnosis not present

## 2020-08-28 DIAGNOSIS — C44722 Squamous cell carcinoma of skin of right lower limb, including hip: Secondary | ICD-10-CM | POA: Diagnosis not present

## 2020-08-28 DIAGNOSIS — D0472 Carcinoma in situ of skin of left lower limb, including hip: Secondary | ICD-10-CM | POA: Diagnosis not present

## 2020-09-02 DIAGNOSIS — E7800 Pure hypercholesterolemia, unspecified: Secondary | ICD-10-CM | POA: Diagnosis not present

## 2020-09-02 DIAGNOSIS — E1121 Type 2 diabetes mellitus with diabetic nephropathy: Secondary | ICD-10-CM | POA: Diagnosis not present

## 2020-09-02 DIAGNOSIS — N183 Chronic kidney disease, stage 3 unspecified: Secondary | ICD-10-CM | POA: Diagnosis not present

## 2020-09-02 DIAGNOSIS — I10 Essential (primary) hypertension: Secondary | ICD-10-CM | POA: Diagnosis not present

## 2020-09-02 DIAGNOSIS — G8929 Other chronic pain: Secondary | ICD-10-CM | POA: Diagnosis not present

## 2020-09-02 DIAGNOSIS — I4891 Unspecified atrial fibrillation: Secondary | ICD-10-CM | POA: Diagnosis not present

## 2020-10-09 DIAGNOSIS — I4891 Unspecified atrial fibrillation: Secondary | ICD-10-CM | POA: Diagnosis not present

## 2020-10-09 DIAGNOSIS — G8929 Other chronic pain: Secondary | ICD-10-CM | POA: Diagnosis not present

## 2020-10-09 DIAGNOSIS — N183 Chronic kidney disease, stage 3 unspecified: Secondary | ICD-10-CM | POA: Diagnosis not present

## 2020-10-09 DIAGNOSIS — I10 Essential (primary) hypertension: Secondary | ICD-10-CM | POA: Diagnosis not present

## 2020-10-09 DIAGNOSIS — E7800 Pure hypercholesterolemia, unspecified: Secondary | ICD-10-CM | POA: Diagnosis not present

## 2020-10-09 DIAGNOSIS — E1121 Type 2 diabetes mellitus with diabetic nephropathy: Secondary | ICD-10-CM | POA: Diagnosis not present

## 2020-10-09 DIAGNOSIS — Z7901 Long term (current) use of anticoagulants: Secondary | ICD-10-CM | POA: Diagnosis not present

## 2020-10-22 DIAGNOSIS — Z7901 Long term (current) use of anticoagulants: Secondary | ICD-10-CM | POA: Diagnosis not present

## 2020-11-18 DIAGNOSIS — R7309 Other abnormal glucose: Secondary | ICD-10-CM | POA: Diagnosis not present

## 2020-11-18 DIAGNOSIS — H26493 Other secondary cataract, bilateral: Secondary | ICD-10-CM | POA: Diagnosis not present

## 2020-11-18 DIAGNOSIS — H40011 Open angle with borderline findings, low risk, right eye: Secondary | ICD-10-CM | POA: Diagnosis not present

## 2020-11-18 DIAGNOSIS — H40013 Open angle with borderline findings, low risk, bilateral: Secondary | ICD-10-CM | POA: Diagnosis not present

## 2020-11-20 DIAGNOSIS — I110 Hypertensive heart disease with heart failure: Secondary | ICD-10-CM | POA: Diagnosis not present

## 2020-11-20 DIAGNOSIS — E785 Hyperlipidemia, unspecified: Secondary | ICD-10-CM | POA: Diagnosis not present

## 2020-11-20 DIAGNOSIS — R32 Unspecified urinary incontinence: Secondary | ICD-10-CM | POA: Diagnosis not present

## 2020-11-20 DIAGNOSIS — Z7901 Long term (current) use of anticoagulants: Secondary | ICD-10-CM | POA: Diagnosis not present

## 2020-11-20 DIAGNOSIS — I509 Heart failure, unspecified: Secondary | ICD-10-CM | POA: Diagnosis not present

## 2020-11-20 DIAGNOSIS — N3281 Overactive bladder: Secondary | ICD-10-CM | POA: Diagnosis not present

## 2020-11-20 DIAGNOSIS — I4891 Unspecified atrial fibrillation: Secondary | ICD-10-CM | POA: Diagnosis not present

## 2020-11-20 DIAGNOSIS — D6869 Other thrombophilia: Secondary | ICD-10-CM | POA: Diagnosis not present

## 2020-11-20 DIAGNOSIS — Z6835 Body mass index (BMI) 35.0-35.9, adult: Secondary | ICD-10-CM | POA: Diagnosis not present

## 2020-11-27 DIAGNOSIS — N183 Chronic kidney disease, stage 3 unspecified: Secondary | ICD-10-CM | POA: Diagnosis not present

## 2020-11-27 DIAGNOSIS — I4891 Unspecified atrial fibrillation: Secondary | ICD-10-CM | POA: Diagnosis not present

## 2020-11-27 DIAGNOSIS — E7800 Pure hypercholesterolemia, unspecified: Secondary | ICD-10-CM | POA: Diagnosis not present

## 2020-11-27 DIAGNOSIS — M7989 Other specified soft tissue disorders: Secondary | ICD-10-CM | POA: Diagnosis not present

## 2020-11-27 DIAGNOSIS — G8929 Other chronic pain: Secondary | ICD-10-CM | POA: Diagnosis not present

## 2020-11-27 DIAGNOSIS — E1121 Type 2 diabetes mellitus with diabetic nephropathy: Secondary | ICD-10-CM | POA: Diagnosis not present

## 2020-11-27 DIAGNOSIS — Z7901 Long term (current) use of anticoagulants: Secondary | ICD-10-CM | POA: Diagnosis not present

## 2020-11-27 DIAGNOSIS — I10 Essential (primary) hypertension: Secondary | ICD-10-CM | POA: Diagnosis not present

## 2020-12-10 ENCOUNTER — Telehealth: Payer: Self-pay | Admitting: Cardiology

## 2020-12-10 NOTE — Telephone Encounter (Signed)
Called the pt back, to discuss more information about call placed to the office.   Pt is calling to inform Dr. Marlou Porch that every morning when she goes from a lying position in the bed, to a sitting position, she gets dizzy.  Pt states she does dangle her legs on the side of the bed for about 2-3 mins before standing up.  Pt states after that, she stands up and dizziness completely goes away and she has no further issues throughout the day.   Pt states this has been occurring for about a month now. She states she monitors her pressures daily, and they average anywhere from high 915A-569V systolic.  Last BP was 133/50.  Pt states the dizziness only occurs from lying to sitting position. She has no other cardiac complaints like chest pain, palpitations, sob, doe, pre-syncopal or syncopal episodes when dizziness occurs, or thereafter.   Pt states she is super mindful in changing positions slowly. She states she hydrates well, especially during the summer months.  Pt is asking Dr. Marlou Porch is there anything else she should be doing, or should one of her cardiac/BP meds be titrated down.  Pt states her PCP advised her to ask.   Advised the pt to continue doing what she's doing, for this seems to help regulate her dizzy spells upon awakening.  Advised her to continue staying hydrated. Advised her to make sure she keeps changing positions slowly, to avoid orthostasis.   Advised her she could test this out to see if her BPs are an issue during her dizziness, by taking it lying, sitting, standing, in 3-5 min increments.  Informed the pt that I will forward this message to Dr. Marlou Porch to further review and advise on.  Informed her a triage nurse will follow-up with her accordingly thereafter.  Pt verbalized understanding and agrees with this plan.

## 2020-12-10 NOTE — Telephone Encounter (Signed)
STAT if patient feels like he/she is going to faint   Are you dizzy now? No , she stated it normally on happens 1st thing in the morning.    Do you feel faint or have you passed out? No   Do you have any other symptoms? She denies any other symptoms  Have you checked your HR and BP (record if available)?  BP - 130/55 range  Hr she wasn't sure .    She stated this has been going on about a month.  Her pcp wanted her to reach out to [place] to see if her BP med needed to be adjusted ?  PCP was not comfortable adjusting meds.   Best number  [phone]

## 2020-12-11 NOTE — Telephone Encounter (Signed)
Spoke with pt and reviewed recommendation.  Pt is not currently taking Losartan.  Only takes Telmisartan 80mg  QD and Bisoprolol/HCTZ 10/6.25mg  BID for BP.  Advised I will send to Dr. Marlou Porch for new recommendations.

## 2020-12-11 NOTE — Telephone Encounter (Signed)
Message  Lets stop her losartan 100 mg once a day.  Candee Furbish, MD

## 2020-12-15 NOTE — Telephone Encounter (Signed)
Pt is aware to discontinue the telmisartan.  She will continue to monitor for s/s and call back if any further concerns.  She was grateful for the call abc information.  Medication removed from her medication list.

## 2021-01-07 DIAGNOSIS — Z7901 Long term (current) use of anticoagulants: Secondary | ICD-10-CM | POA: Diagnosis not present

## 2021-02-06 DIAGNOSIS — Z7901 Long term (current) use of anticoagulants: Secondary | ICD-10-CM | POA: Diagnosis not present

## 2021-03-03 DIAGNOSIS — E1121 Type 2 diabetes mellitus with diabetic nephropathy: Secondary | ICD-10-CM | POA: Diagnosis not present

## 2021-03-03 DIAGNOSIS — I10 Essential (primary) hypertension: Secondary | ICD-10-CM | POA: Diagnosis not present

## 2021-03-03 DIAGNOSIS — Z0000 Encounter for general adult medical examination without abnormal findings: Secondary | ICD-10-CM | POA: Diagnosis not present

## 2021-03-03 DIAGNOSIS — Z7901 Long term (current) use of anticoagulants: Secondary | ICD-10-CM | POA: Diagnosis not present

## 2021-03-03 DIAGNOSIS — E7800 Pure hypercholesterolemia, unspecified: Secondary | ICD-10-CM | POA: Diagnosis not present

## 2021-03-03 DIAGNOSIS — N183 Chronic kidney disease, stage 3 unspecified: Secondary | ICD-10-CM | POA: Diagnosis not present

## 2021-03-03 DIAGNOSIS — I4891 Unspecified atrial fibrillation: Secondary | ICD-10-CM | POA: Diagnosis not present

## 2021-03-03 DIAGNOSIS — Z1389 Encounter for screening for other disorder: Secondary | ICD-10-CM | POA: Diagnosis not present

## 2021-04-07 DIAGNOSIS — Z7901 Long term (current) use of anticoagulants: Secondary | ICD-10-CM | POA: Diagnosis not present

## 2021-04-15 DIAGNOSIS — Z7901 Long term (current) use of anticoagulants: Secondary | ICD-10-CM | POA: Diagnosis not present

## 2021-05-15 DIAGNOSIS — Z7901 Long term (current) use of anticoagulants: Secondary | ICD-10-CM | POA: Diagnosis not present

## 2021-05-26 DIAGNOSIS — H35363 Drusen (degenerative) of macula, bilateral: Secondary | ICD-10-CM | POA: Diagnosis not present

## 2021-05-26 DIAGNOSIS — H35372 Puckering of macula, left eye: Secondary | ICD-10-CM | POA: Diagnosis not present

## 2021-05-26 DIAGNOSIS — H40013 Open angle with borderline findings, low risk, bilateral: Secondary | ICD-10-CM | POA: Diagnosis not present

## 2021-05-26 DIAGNOSIS — H26492 Other secondary cataract, left eye: Secondary | ICD-10-CM | POA: Diagnosis not present

## 2021-05-26 DIAGNOSIS — R7309 Other abnormal glucose: Secondary | ICD-10-CM | POA: Diagnosis not present

## 2021-05-27 DIAGNOSIS — Z7901 Long term (current) use of anticoagulants: Secondary | ICD-10-CM | POA: Diagnosis not present

## 2021-07-03 DIAGNOSIS — Z7901 Long term (current) use of anticoagulants: Secondary | ICD-10-CM | POA: Diagnosis not present

## 2021-07-31 ENCOUNTER — Ambulatory Visit (INDEPENDENT_AMBULATORY_CARE_PROVIDER_SITE_OTHER): Payer: Medicare HMO | Admitting: Cardiology

## 2021-07-31 ENCOUNTER — Encounter: Payer: Self-pay | Admitting: Cardiology

## 2021-07-31 ENCOUNTER — Other Ambulatory Visit: Payer: Self-pay

## 2021-07-31 DIAGNOSIS — R079 Chest pain, unspecified: Secondary | ICD-10-CM

## 2021-07-31 DIAGNOSIS — I4811 Longstanding persistent atrial fibrillation: Secondary | ICD-10-CM | POA: Diagnosis not present

## 2021-07-31 DIAGNOSIS — I10 Essential (primary) hypertension: Secondary | ICD-10-CM | POA: Diagnosis not present

## 2021-07-31 DIAGNOSIS — I4891 Unspecified atrial fibrillation: Secondary | ICD-10-CM | POA: Diagnosis not present

## 2021-07-31 DIAGNOSIS — E7849 Other hyperlipidemia: Secondary | ICD-10-CM

## 2021-07-31 DIAGNOSIS — D6869 Other thrombophilia: Secondary | ICD-10-CM | POA: Diagnosis not present

## 2021-07-31 NOTE — Assessment & Plan Note (Signed)
Lab work has been followed closely with Coumadin high risk medication.  No bleeding issues.  Dr. Eddie Dibbles office had monitored in the past.

## 2021-07-31 NOTE — Patient Instructions (Signed)
Medication Instructions:  The current medical regimen is effective;  continue present plan and medications.  *If you need a refill on your cardiac medications before your next appointment, please call your pharmacy*  Follow-Up: At [facility], you and your health needs are our priority.  As part of our continuing mission to provide you with exceptional heart care, we have created designated Provider Care Teams.  These Care Teams include your primary Cardiologist (physician) and Advanced Practice Providers (APPs -  Physician Assistants and Nurse Practitioners) who all work together to provide you with the care you need, when you need it.  We recommend signing up for the patient portal called "MyChart".  Sign up information is provided on this After Visit Summary.  MyChart is used to connect with patients for Virtual Visits (Telemedicine).  Patients are able to view lab/test results, encounter notes, upcoming appointments, etc.  Non-urgent messages can be sent to your provider as well.   To learn more about what you can do with MyChart, go to [email or web address].    Your next appointment:   6 month(s)  The format for your next appointment:   In Person  Provider:   Nicholes Rough, PA-C, Melina Copa, PA-C, Ermalinda Barrios, PA-C, Christen Bame, NP, or Richardson Dopp, PA-C         Thank you for choosing [facility]!!

## 2021-07-31 NOTE — Assessment & Plan Note (Signed)
Overall doing quite well rate controlled.  Heart rate 75 bpm today no changes made with bisoprolol.

## 2021-07-31 NOTE — Assessment & Plan Note (Signed)
Currently on pravastatin 40 mg a day.  Had trouble tolerating Crestor in the past.  LDL 92.

## 2021-07-31 NOTE — Assessment & Plan Note (Signed)
Stable, well controlled.  On bisoprolol hydrochlorothiazide.  Continue with current medication management

## 2021-07-31 NOTE — Assessment & Plan Note (Signed)
Could have been an episode of gastritis or GERD.  She has not had any future episodes.  Continue to monitor.  If symptoms worsen or become more worrisome we will have low threshold for further testing.

## 2021-07-31 NOTE — Progress Notes (Signed)
Cardiology Office Note:    Date:  07/31/2021   ID:  [name] [name], DOB August 31, 1933, MRN [id number]  PCP:  Hulan Fess, MD   [facility] Providers Cardiologist:  Candee Furbish, MD     Referring MD: Hulan Fess, MD    History of Present Illness:    [name] [name] is a 88 y.o. female here for the follow-up of permanent atrial fibrillation doing well without any fevers chills nausea vomiting syncope bleeding.  Recently moved from her home of several years in [facility] to a small [place] in [place].  Her daughter lives in [place] works for the [employer].  She had an episode of gastric discomfort in the middle of the night when she had her daughter and son-in-law over from [place].  She had made dinner 2 nights in a row as well as slow cooked ribs.  The sensation subsided and she went back to sleep.  She woke up the next morning and felt fine.  She has not had any bleeding issues no fevers no chills Past Medical History:  Diagnosis Date   Acute blood loss anemia    Bilateral lower extremity edema    Chronic atrial fibrillation (HCC)    Constipation    Diabetes (HCC)    prediabetes   GERD (gastroesophageal reflux disease)    Hyperlipidemia    Hypertension    Hypokalemia    OAB (overactive bladder)    Osteoarthritis    Pressure ulcer    Protein calorie malnutrition ([place])    Spinal stenosis, thoracic    UTI (lower urinary tract infection)    Weakness of both lower extremities     Past Surgical History:  Procedure Laterality Date   BACK SURGERY     CARPAL TUNNEL RELEASE Right    CATARACT EXTRACTION  Apr 11, 2012, Jun 04 2012   HIP SURGERY     restrictor in left hip due to 3 hip dislocations   KNEE SURGERY     2 total knees -right and left   NECK SURGERY     THORACIC DISCECTOMY N/A 09/19/2015   Procedure: THORACIC LAMINECTOMY DECOMPRESSION T2-3, T3-4,T5-6;  Surgeon: Kevan Ny Ditty, MD;  Location: [facility];  Service: Neurosurgery;  Laterality: N/A;   THORACIC LAMINECTOMY DECOMPRESSION T2-3, T3-4,T5-6   TONSILLECTOMY     TOTAL HIP ARTHROPLASTY     right and left hips   WISDOM TOOTH EXTRACTION      Current Medications: Current Meds  Medication Sig   acetaminophen (TYLENOL) 325 MG tablet Take 2 tablets (650 mg total) by mouth every 4 (four) hours as needed (temp > 100.5).   bisoprolol-hydrochlorothiazide (ZIAC) 10-6.25 MG per tablet Take 1 tablet by mouth 2 (two) times daily.    Ergocalciferol (VITAMIN D2) 2000 units TABS Take 1 tablet by mouth daily.   hydrocortisone cream 1 % Apply 1 application topically daily as needed for itching.   loperamide (IMODIUM A-D) 2 MG tablet Take 4 mg by mouth 4 (four) times daily as needed for diarrhea or loose stools.   oxybutynin (DITROPAN-XL) 5 MG 24 hr tablet Take 5 mg by mouth daily.   potassium chloride (KLOR-CON) 10 MEQ tablet Take 10 mEq by mouth daily.   pravastatin (PRAVACHOL) 40 MG tablet Take 40 mg by mouth daily.   ranitidine (ZANTAC) 75 MG tablet Take 75 mg by mouth daily as needed for heartburn.   warfarin (COUMADIN) 2 MG tablet Take 1.5 tablets (3 mg) by mouth daily  Allergies:   Nsaids and Statins   Social History   Socioeconomic History   Marital status: Divorced    Spouse name: Not on file   Number of children: 3   Years of education: Not on file   Highest education level: Not on file  Occupational History   Occupation: retired  Tobacco Use   Smoking status: Former    Years: 20.00    Types: Cigarettes    Quit date: 09/15/1975    Years since quitting: 45.9   Smokeless tobacco: Never  Substance and Sexual Activity   Alcohol use: Yes    Alcohol/week: 0.0 standard drinks    Comment: social   Drug use: No   Sexual activity: Not on file  Other Topics Concern   Not on file  Social History Narrative   Tobacco use   Cigarettes: Former smoker   Pack- year Hx 20   No smoking, History of smoking   Alcohol : yes , occasionally    Occupation : retired Cabin crew   Marital  status Divorced   Children 3      Social Determinants of Radio broadcast assistant Strain: Not on file  Food Insecurity: Not on file  Transportation Needs: Not on file  Physical Activity: Not on file  Stress: Not on file  Social Connections: Not on file     Family History: The patient's family history includes Colon cancer in her father; Heart disease in her mother; Uterine cancer (age of onset: 25) in her sister.  ROS:   Please see the history of present illness.     All other systems reviewed and are negative.  EKGs/Labs/Other Studies Reviewed:    The following studies were reviewed today: ECHO 2013:  - Procedure narrative: Transthoracic echocardiography. Image    quality was suboptimal. The study was technically    difficult, as a result of poor sound wave transmission.  - Left ventricle: Systolic function was normal. The    estimated ejection fraction was in the range of 55% to    60%.  - Mitral valve: Calcified annulus. Mildly thickened leaflets    .  - Left atrium: The atrium was mildly dilated.  - Right atrium: The atrium was mildly dilated  EKG:  EKG is  ordered today.  The ekg ordered today demonstrates atrial fibrillation heart rate 75 bpm no ischemic changes  Recent Labs: No results found for requested labs within last 8760 hours.  Recent Lipid Panel No results found for: CHOL, TRIG, HDL, CHOLHDL, VLDL, LDLCALC, LDLDIRECT   Risk Assessment/Calculations:              Physical Exam:    VS:  BP 120/70 (BP Location: Left Arm, Patient Position: Sitting, Cuff Size: Normal)    Pulse 75    Ht 5\' 8"  (1.727 m)    Wt 230 lb (104.3 kg)    SpO2 95%    BMI 34.97 kg/m     Wt Readings from Last 3 Encounters:  07/31/21 230 lb (104.3 kg)  06/11/20 231 lb (104.8 kg)  02/21/19 242 lb (109.8 kg)     GEN:  Well nourished, well developed in no acute distress HEENT: Normal NECK: No JVD; No carotid bruits LYMPHATICS: No lymphadenopathy CARDIAC: Irregularly  irregular, no murmurs, no rubs, gallops RESPIRATORY:  Clear to auscultation without rales, wheezing or rhonchi  ABDOMEN: Soft, non-tender, non-distended MUSCULOSKELETAL:  No edema; No deformity  SKIN: Warm and dry NEUROLOGIC:  Alert and oriented x 3 PSYCHIATRIC:  Normal affect   ASSESSMENT:    1. Longstanding persistent atrial fibrillation ([place])   2. Hypercoagulable state due to atrial fibrillation ([place])   3. Primary hypertension   4. Other hyperlipidemia   5. Chest pain of uncertain etiology    PLAN:    In order of problems listed above:  Atrial fibrillation ([place]) Overall doing quite well rate controlled.  Heart rate 75 bpm today no changes made with bisoprolol.  Hypercoagulable state due to atrial fibrillation [facility]) Lab work has been followed closely with Coumadin high risk medication.  No bleeding issues.  Dr. Eddie Dibbles office had monitored in the past.  HTN (hypertension) Stable, well controlled.  On bisoprolol hydrochlorothiazide.  Continue with current medication management  Hyperlipidemia Currently on pravastatin 40 mg a day.  Had trouble tolerating Crestor in the past.  LDL 92.  Chest pain of uncertain etiology Could have been an episode of gastritis or GERD.  She has not had any future episodes.  Continue to monitor.  If symptoms worsen or become more worrisome we will have low threshold for further testing.         Medication Adjustments/Labs and Tests Ordered: Current medicines are reviewed at length with the patient today.  Concerns regarding medicines are outlined above.  Orders Placed This Encounter  Procedures   EKG 12-Lead   No orders of the defined types were placed in this encounter.   Patient Instructions  Medication Instructions:  The current medical regimen is effective;  continue present plan and medications.  *If you need a refill on your cardiac medications before your next appointment, please call your pharmacy*  Follow-Up: At [facility], you and your health needs are our priority.  As part of our continuing mission to provide you with exceptional heart care, we have created designated Provider Care Teams.  These Care Teams include your primary Cardiologist (physician) and Advanced Practice Providers (APPs -  Physician Assistants and Nurse Practitioners) who all work together to provide you with the care you need, when you need it.  We recommend signing up for the patient portal called "MyChart".  Sign up information is provided on this After Visit Summary.  MyChart is used to connect with patients for Virtual Visits (Telemedicine).  Patients are able to view lab/test results, encounter notes, upcoming appointments, etc.  Non-urgent messages can be sent to your provider as well.   To learn more about what you can do with MyChart, go to [email or web address].    Your next appointment:   6 month(s)  The format for your next appointment:   In Person  Provider:   Nicholes Rough, PA-C, Melina Copa, PA-C, Ermalinda Barrios, PA-C, Christen Bame, NP, or Richardson Dopp, PA-C         Thank you for choosing [facility]!!      Signed, Candee Furbish, MD  07/31/2021 4:46 PM    [place]

## 2021-08-13 DIAGNOSIS — R059 Cough, unspecified: Secondary | ICD-10-CM | POA: Diagnosis not present

## 2021-09-08 DIAGNOSIS — K219 Gastro-esophageal reflux disease without esophagitis: Secondary | ICD-10-CM | POA: Diagnosis not present

## 2021-09-08 DIAGNOSIS — E261 Secondary hyperaldosteronism: Secondary | ICD-10-CM | POA: Diagnosis not present

## 2021-09-08 DIAGNOSIS — G8929 Other chronic pain: Secondary | ICD-10-CM | POA: Diagnosis not present

## 2021-09-08 DIAGNOSIS — E785 Hyperlipidemia, unspecified: Secondary | ICD-10-CM | POA: Diagnosis not present

## 2021-09-08 DIAGNOSIS — I4891 Unspecified atrial fibrillation: Secondary | ICD-10-CM | POA: Diagnosis not present

## 2021-09-08 DIAGNOSIS — I110 Hypertensive heart disease with heart failure: Secondary | ICD-10-CM | POA: Diagnosis not present

## 2021-09-08 DIAGNOSIS — I509 Heart failure, unspecified: Secondary | ICD-10-CM | POA: Diagnosis not present

## 2021-09-08 DIAGNOSIS — Z7901 Long term (current) use of anticoagulants: Secondary | ICD-10-CM | POA: Diagnosis not present

## 2021-09-08 DIAGNOSIS — D6869 Other thrombophilia: Secondary | ICD-10-CM | POA: Diagnosis not present

## 2021-09-08 DIAGNOSIS — M1990 Unspecified osteoarthritis, unspecified site: Secondary | ICD-10-CM | POA: Diagnosis not present

## 2021-09-08 DIAGNOSIS — R32 Unspecified urinary incontinence: Secondary | ICD-10-CM | POA: Diagnosis not present

## 2021-09-15 DIAGNOSIS — E1121 Type 2 diabetes mellitus with diabetic nephropathy: Secondary | ICD-10-CM | POA: Diagnosis not present

## 2021-09-15 DIAGNOSIS — E7800 Pure hypercholesterolemia, unspecified: Secondary | ICD-10-CM | POA: Diagnosis not present

## 2021-09-15 DIAGNOSIS — Z6836 Body mass index (BMI) 36.0-36.9, adult: Secondary | ICD-10-CM | POA: Diagnosis not present

## 2021-09-15 DIAGNOSIS — Z7901 Long term (current) use of anticoagulants: Secondary | ICD-10-CM | POA: Diagnosis not present

## 2021-09-15 DIAGNOSIS — N183 Chronic kidney disease, stage 3 unspecified: Secondary | ICD-10-CM | POA: Diagnosis not present

## 2021-09-15 DIAGNOSIS — I700 Atherosclerosis of aorta: Secondary | ICD-10-CM | POA: Diagnosis not present

## 2021-09-15 DIAGNOSIS — I4891 Unspecified atrial fibrillation: Secondary | ICD-10-CM | POA: Diagnosis not present

## 2021-10-13 DIAGNOSIS — R4182 Altered mental status, unspecified: Secondary | ICD-10-CM | POA: Diagnosis not present

## 2021-10-14 DIAGNOSIS — R4182 Altered mental status, unspecified: Secondary | ICD-10-CM | POA: Diagnosis not present

## 2021-10-14 DIAGNOSIS — F05 Delirium due to known physiological condition: Secondary | ICD-10-CM | POA: Diagnosis not present

## 2021-10-14 DIAGNOSIS — Z7901 Long term (current) use of anticoagulants: Secondary | ICD-10-CM | POA: Diagnosis not present

## 2021-10-14 DIAGNOSIS — R69 Illness, unspecified: Secondary | ICD-10-CM | POA: Diagnosis not present

## 2021-10-19 ENCOUNTER — Other Ambulatory Visit: Payer: Self-pay | Admitting: Family Medicine

## 2021-10-19 ENCOUNTER — Other Ambulatory Visit (HOSPITAL_COMMUNITY): Payer: Self-pay | Admitting: Family Medicine

## 2021-10-19 DIAGNOSIS — R4182 Altered mental status, unspecified: Secondary | ICD-10-CM

## 2021-10-20 ENCOUNTER — Other Ambulatory Visit (HOSPITAL_COMMUNITY): Payer: Self-pay | Admitting: Family Medicine

## 2021-10-20 DIAGNOSIS — R4182 Altered mental status, unspecified: Secondary | ICD-10-CM

## 2021-10-21 ENCOUNTER — Ambulatory Visit (HOSPITAL_COMMUNITY): Payer: Medicare HMO

## 2021-10-21 ENCOUNTER — Encounter (HOSPITAL_COMMUNITY): Payer: Self-pay

## 2021-10-23 ENCOUNTER — Telehealth: Payer: Self-pay | Admitting: Cardiology

## 2021-10-23 NOTE — Telephone Encounter (Signed)
Patient called and said that patient's PCP wanted Dr. Marlou Porch to know that patient is having MRI done to head and neck to see if she had a TIA or not. ?

## 2021-10-23 NOTE — Telephone Encounter (Signed)
Dr. Marlou Porch is aware of message.  ?

## 2021-11-04 ENCOUNTER — Other Ambulatory Visit (HOSPITAL_COMMUNITY): Payer: Medicare HMO

## 2021-11-04 ENCOUNTER — Ambulatory Visit (HOSPITAL_COMMUNITY): Payer: Medicare HMO

## 2021-11-07 ENCOUNTER — Other Ambulatory Visit (HOSPITAL_BASED_OUTPATIENT_CLINIC_OR_DEPARTMENT_OTHER): Payer: Medicare HMO

## 2021-11-10 ENCOUNTER — Ambulatory Visit (HOSPITAL_COMMUNITY)
Admission: RE | Admit: 2021-11-10 | Discharge: 2021-11-10 | Disposition: A | Discharge status: Home / Self Care | Payer: Medicare HMO | Source: Ambulatory Visit | Attending: Family Medicine | Admitting: Family Medicine

## 2021-11-10 ENCOUNTER — Encounter (HOSPITAL_COMMUNITY): Payer: Self-pay

## 2021-11-10 ENCOUNTER — Ambulatory Visit (HOSPITAL_COMMUNITY): Payer: Medicare HMO

## 2021-11-10 DIAGNOSIS — I6502 Occlusion and stenosis of left vertebral artery: Secondary | ICD-10-CM | POA: Insufficient documentation

## 2021-11-10 DIAGNOSIS — R9082 White matter disease, unspecified: Secondary | ICD-10-CM | POA: Insufficient documentation

## 2021-11-10 DIAGNOSIS — R4182 Altered mental status, unspecified: Secondary | ICD-10-CM | POA: Insufficient documentation

## 2021-11-10 MED ORDER — GADOBUTROL 1 MMOL/ML IV SOLN
10.0000 mL | Freq: Once | INTRAVENOUS | Status: AC | PRN
  Administered 2021-11-10: 10 mL via INTRAVENOUS

## 2022-02-17 DIAGNOSIS — Z7901 Long term (current) use of anticoagulants: Secondary | ICD-10-CM | POA: Diagnosis not present

## 2022-03-10 DIAGNOSIS — I10 Essential (primary) hypertension: Secondary | ICD-10-CM | POA: Diagnosis not present

## 2022-03-10 DIAGNOSIS — E1121 Type 2 diabetes mellitus with diabetic nephropathy: Secondary | ICD-10-CM | POA: Diagnosis not present

## 2022-03-10 DIAGNOSIS — N3281 Overactive bladder: Secondary | ICD-10-CM | POA: Diagnosis not present

## 2022-03-10 DIAGNOSIS — E7800 Pure hypercholesterolemia, unspecified: Secondary | ICD-10-CM | POA: Diagnosis not present

## 2022-03-10 DIAGNOSIS — Z7901 Long term (current) use of anticoagulants: Secondary | ICD-10-CM | POA: Diagnosis not present

## 2022-03-10 DIAGNOSIS — Z23 Encounter for immunization: Secondary | ICD-10-CM | POA: Diagnosis not present

## 2022-03-10 DIAGNOSIS — R609 Edema, unspecified: Secondary | ICD-10-CM | POA: Diagnosis not present

## 2022-03-10 DIAGNOSIS — Z1389 Encounter for screening for other disorder: Secondary | ICD-10-CM | POA: Diagnosis not present

## 2022-03-10 DIAGNOSIS — I700 Atherosclerosis of aorta: Secondary | ICD-10-CM | POA: Diagnosis not present

## 2022-03-10 DIAGNOSIS — Z0000 Encounter for general adult medical examination without abnormal findings: Secondary | ICD-10-CM | POA: Diagnosis not present

## 2022-03-10 DIAGNOSIS — I4891 Unspecified atrial fibrillation: Secondary | ICD-10-CM | POA: Diagnosis not present

## 2022-03-21 NOTE — Progress Notes (Signed)
Office Visit    Patient Name: [name] [name] Date of Encounter: 03/22/2022  PCP:  [name] [name], [place]  Cardiologist:  [name] Furbish, MD  Advanced Practice Provider:  No care team member to display Electrophysiologist:  None   HPI    [name] [name] is a 88 y.o. female with a past medical history significant for permanent atrial fibrillation, diabetes mellitus, hyperlipidemia, hypertension presents today for 6 month follow-up.  She was last seen by Dr. Marlou [name] February 2023.  At that time she had an episode of gastric discomfort in the middle of the night.  She had made dinner 2 nights in a row as well as slow cooked ribs.  Sensation subsided and she would back to sleep.  She felt fine the next morning.  Otherwise, was doing okay from a cardiac standpoint.  Today, she tells me back in April she was walking down the hall at home and did not realize where she was.  She is very confused.  She never lost consciousness.  She went to the ED and had a full work-up done.  She did not have a stroke.  This has not happened since.  She has been compliant with all her medicines and has not had any side effects.  No issues with bleeding on Coumadin.  She states her INR has been well controlled.  She gets labs done through her primary care every 6 months.  She recently had labs drawn.  Reports no shortness of breath nor dyspnea on exertion. Reports no chest pain, pressure, or tightness. No edema, orthopnea, PND. Reports no palpitations.    Past Medical History    Past Medical History:  Diagnosis Date   Acute blood loss anemia    Bilateral lower extremity edema    Chronic atrial fibrillation (HCC)    Constipation    Diabetes (HCC)    prediabetes   GERD (gastroesophageal reflux disease)    Hyperlipidemia    Hypertension    Hypokalemia    OAB (overactive bladder)    Osteoarthritis    Pressure ulcer    Protein calorie malnutrition ([place])    Spinal  stenosis, thoracic    UTI (lower urinary tract infection)    Weakness of both lower extremities    Past Surgical History:  Procedure Laterality Date   BACK SURGERY     CARPAL TUNNEL RELEASE Right    CATARACT EXTRACTION  Apr 11, 2012, Jun 04 2012   HIP SURGERY     restrictor in left hip due to 3 hip dislocations   KNEE SURGERY     2 total knees -right and left   NECK SURGERY     THORACIC DISCECTOMY N/A 09/19/2015   Procedure: THORACIC LAMINECTOMY DECOMPRESSION T2-3, T3-4,T5-6;  Surgeon: [name] Ny Ditty, MD;  Location: [facility];  Service: Neurosurgery;  Laterality: N/A;  THORACIC LAMINECTOMY DECOMPRESSION T2-3, T3-4,T5-6   TONSILLECTOMY     TOTAL HIP ARTHROPLASTY     right and left hips   WISDOM TOOTH EXTRACTION      Allergies  Allergies  Allergen Reactions   Nsaids     On coumadin   Statins     Cause leg cramps     EKGs/Labs/Other Studies Reviewed:   The following studies were reviewed today:  ECHO 2013:  - Procedure narrative: Transthoracic echocardiography. Image    quality was suboptimal. The study was technically    difficult, as a result of poor sound wave  transmission.  - Left ventricle: Systolic function was normal. The    estimated ejection fraction was in the range of 55% to    60%.  - Mitral valve: Calcified annulus. Mildly thickened leaflets    .  - Left atrium: The atrium was mildly dilated.  - Right atrium: The atrium was mildly dilated  EKG:  EKG is not ordered today.   Recent Labs: No results found for requested labs within last 365 days.  Recent Lipid Panel No results found for: "CHOL", "TRIG", "HDL", "CHOLHDL", "VLDL", "LDLCALC", "LDLDIRECT"   Home Medications   Current Meds  Medication Sig   acetaminophen (TYLENOL) 325 MG tablet Take 2 tablets (650 mg total) by mouth every 4 (four) hours as needed (temp > 100.5).   bisoprolol-hydrochlorothiazide (ZIAC) 10-6.25 MG per tablet Take 1 tablet by mouth 2 (two) times daily.     Ergocalciferol (VITAMIN D2) 2000 units TABS Take 1 tablet by mouth daily.   hydrocortisone cream 1 % Apply 1 application topically daily as needed for itching.   loperamide (IMODIUM A-D) 2 MG tablet Take 4 mg by mouth 4 (four) times daily as needed for diarrhea or loose stools.   oxybutynin (DITROPAN-XL) 5 MG 24 hr tablet Take 5 mg by mouth daily.   pravastatin (PRAVACHOL) 40 MG tablet Take 40 mg by mouth daily.   ranitidine (ZANTAC) 75 MG tablet Take 75 mg by mouth daily as needed for heartburn.   warfarin (COUMADIN) 2 MG tablet Take 1.5 tablets (3 mg) by mouth daily     Review of Systems      All other systems reviewed and are otherwise negative except as noted above.  Physical Exam    VS:  BP 132/80   Pulse 71   Ht 5' 7.5" (1.715 m)   Wt 225 lb 9.6 oz (102.3 kg)   SpO2 95%   BMI 34.81 kg/m  , BMI Body mass index is 34.81 kg/m.  Wt Readings from Last 3 Encounters:  03/22/22 225 lb 9.6 oz (102.3 kg)  07/31/21 230 lb (104.3 kg)  06/11/20 231 lb (104.8 kg)     GEN: Well nourished, well developed, in no acute distress. HEENT: normal. Neck: Supple, no JVD, carotid bruits, or masses. Cardiac: irregularly irregular, no murmurs, rubs, or gallops. No clubbing, cyanosis, edema.  Radials/PT 2+ and equal bilaterally.  Respiratory:  Respirations regular and unlabored, clear to auscultation bilaterally. GI: Soft, nontender, nondistended. MS: No deformity or atrophy. Skin: Warm and dry, no rash. Neuro:  Strength and sensation are intact. Psych: Normal affect.  Assessment & Plan    Longstanding persistent atrial fibrillation -no dizziness or lightheaded,  sometimes she does feel like her heart is beating faster than normal is she over exerted herself.  -Continue Coumadin and monitoring of INR -Rate controlled  Hypercoagulable state due to atrial fibrillation -no bleeding issues -every 6 weeks they check INR -dosage has not changed in a year  Hypertension -144/90 initially but  132/80 on repeat -continue current medications  Hyperlipidemia -She just had labs done with PCP -Last lipid panel March 2023 showed triglycerides 135, LDL 96, HDL 48, and total cholesterol 168  Chest pain of uncertain etiology -no further chest pain       Disposition: Follow up 6 month with [name] Furbish, MD or APP.  Signed, Elgie Collard, PA-C 03/22/2022, 1:40 PM [facility]

## 2022-03-22 ENCOUNTER — Encounter: Payer: Self-pay | Admitting: Physician Assistant

## 2022-03-22 ENCOUNTER — Ambulatory Visit: Payer: Medicare HMO | Attending: Physician Assistant | Admitting: Physician Assistant

## 2022-03-22 VITALS — BP 132/80 | HR 71 | Ht 67.5 in | Wt 225.6 lb

## 2022-03-22 DIAGNOSIS — I4821 Permanent atrial fibrillation: Secondary | ICD-10-CM

## 2022-03-22 DIAGNOSIS — I4819 Other persistent atrial fibrillation: Secondary | ICD-10-CM

## 2022-03-22 DIAGNOSIS — D6869 Other thrombophilia: Secondary | ICD-10-CM | POA: Diagnosis not present

## 2022-03-22 DIAGNOSIS — E782 Mixed hyperlipidemia: Secondary | ICD-10-CM | POA: Diagnosis not present

## 2022-03-22 DIAGNOSIS — R079 Chest pain, unspecified: Secondary | ICD-10-CM

## 2022-03-22 DIAGNOSIS — I10 Essential (primary) hypertension: Secondary | ICD-10-CM | POA: Diagnosis not present

## 2022-03-22 NOTE — Patient Instructions (Signed)
Medication Instructions:  Your physician recommends that you continue on your current medications as directed. Please refer to the Current Medication list given to you today.  *If you need a refill on your cardiac medications before your next appointment, please call your pharmacy*   Lab Work: None If you have labs (blood work) drawn today and your tests are completely normal, you will receive your results only by: [place] (if you have MyChart) OR A paper copy in the mail If you have any lab test that is abnormal or we need to change your treatment, we will call you to review the results.  Follow-Up: At [facility], you and your health needs are our priority.  As part of our continuing mission to provide you with exceptional heart care, we have created designated Provider Care Teams.  These Care Teams include your primary Cardiologist (physician) and Advanced Practice Providers (APPs -  Physician Assistants and Nurse Practitioners) who all work together to provide you with the care you need, when you need it.  Your next appointment:   6 month(s)  The format for your next appointment:   In Person  Provider:   Candee Furbish, MD  or Nicholes Rough, PA-C        Important Information About Sugar

## 2022-03-29 DIAGNOSIS — Z6836 Body mass index (BMI) 36.0-36.9, adult: Secondary | ICD-10-CM | POA: Diagnosis not present

## 2022-03-29 DIAGNOSIS — L03213 Periorbital cellulitis: Secondary | ICD-10-CM | POA: Diagnosis not present

## 2022-03-30 DIAGNOSIS — H00014 Hordeolum externum left upper eyelid: Secondary | ICD-10-CM | POA: Diagnosis not present

## 2022-03-30 DIAGNOSIS — H00034 Abscess of left upper eyelid: Secondary | ICD-10-CM | POA: Diagnosis not present

## 2022-04-13 DIAGNOSIS — H00014 Hordeolum externum left upper eyelid: Secondary | ICD-10-CM | POA: Diagnosis not present

## 2022-04-13 DIAGNOSIS — H00034 Abscess of left upper eyelid: Secondary | ICD-10-CM | POA: Diagnosis not present

## 2022-04-20 DIAGNOSIS — Z7901 Long term (current) use of anticoagulants: Secondary | ICD-10-CM | POA: Diagnosis not present

## 2022-05-03 DIAGNOSIS — N183 Chronic kidney disease, stage 3 unspecified: Secondary | ICD-10-CM | POA: Diagnosis not present

## 2022-05-03 DIAGNOSIS — E7800 Pure hypercholesterolemia, unspecified: Secondary | ICD-10-CM | POA: Diagnosis not present

## 2022-05-03 DIAGNOSIS — I10 Essential (primary) hypertension: Secondary | ICD-10-CM | POA: Diagnosis not present

## 2022-05-03 DIAGNOSIS — E1121 Type 2 diabetes mellitus with diabetic nephropathy: Secondary | ICD-10-CM | POA: Diagnosis not present

## 2022-05-03 DIAGNOSIS — G8929 Other chronic pain: Secondary | ICD-10-CM | POA: Diagnosis not present

## 2022-05-03 DIAGNOSIS — I4891 Unspecified atrial fibrillation: Secondary | ICD-10-CM | POA: Diagnosis not present

## 2022-05-05 DIAGNOSIS — H00014 Hordeolum externum left upper eyelid: Secondary | ICD-10-CM | POA: Diagnosis not present

## 2022-05-05 DIAGNOSIS — H00034 Abscess of left upper eyelid: Secondary | ICD-10-CM | POA: Diagnosis not present

## 2022-06-01 DIAGNOSIS — Z7901 Long term (current) use of anticoagulants: Secondary | ICD-10-CM | POA: Diagnosis not present

## 2022-07-28 DIAGNOSIS — Z6835 Body mass index (BMI) 35.0-35.9, adult: Secondary | ICD-10-CM | POA: Diagnosis not present

## 2022-07-28 DIAGNOSIS — R32 Unspecified urinary incontinence: Secondary | ICD-10-CM | POA: Diagnosis not present

## 2022-07-28 DIAGNOSIS — M1990 Unspecified osteoarthritis, unspecified site: Secondary | ICD-10-CM | POA: Diagnosis not present

## 2022-07-28 DIAGNOSIS — R600 Localized edema: Secondary | ICD-10-CM | POA: Diagnosis not present

## 2022-07-28 DIAGNOSIS — I4891 Unspecified atrial fibrillation: Secondary | ICD-10-CM | POA: Diagnosis not present

## 2022-07-28 DIAGNOSIS — I700 Atherosclerosis of aorta: Secondary | ICD-10-CM | POA: Diagnosis not present

## 2022-07-28 DIAGNOSIS — Z87891 Personal history of nicotine dependence: Secondary | ICD-10-CM | POA: Diagnosis not present

## 2022-07-28 DIAGNOSIS — N189 Chronic kidney disease, unspecified: Secondary | ICD-10-CM | POA: Diagnosis not present

## 2022-07-28 DIAGNOSIS — Z008 Encounter for other general examination: Secondary | ICD-10-CM | POA: Diagnosis not present

## 2022-07-28 DIAGNOSIS — I129 Hypertensive chronic kidney disease with stage 1 through stage 4 chronic kidney disease, or unspecified chronic kidney disease: Secondary | ICD-10-CM | POA: Diagnosis not present

## 2022-07-28 DIAGNOSIS — D6869 Other thrombophilia: Secondary | ICD-10-CM | POA: Diagnosis not present

## 2022-07-28 DIAGNOSIS — E785 Hyperlipidemia, unspecified: Secondary | ICD-10-CM | POA: Diagnosis not present

## 2022-07-29 DIAGNOSIS — Z7901 Long term (current) use of anticoagulants: Secondary | ICD-10-CM | POA: Diagnosis not present

## 2022-09-08 ENCOUNTER — Encounter: Payer: Self-pay | Admitting: Cardiology

## 2022-09-08 ENCOUNTER — Ambulatory Visit: Payer: Medicare HMO | Attending: Cardiology | Admitting: Cardiology

## 2022-09-08 VITALS — BP 199/94 | HR 60 | Ht 67.5 in | Wt 223.0 lb

## 2022-09-08 DIAGNOSIS — I4819 Other persistent atrial fibrillation: Secondary | ICD-10-CM

## 2022-09-08 DIAGNOSIS — I4821 Permanent atrial fibrillation: Secondary | ICD-10-CM

## 2022-09-08 DIAGNOSIS — I10 Essential (primary) hypertension: Secondary | ICD-10-CM

## 2022-09-08 DIAGNOSIS — E782 Mixed hyperlipidemia: Secondary | ICD-10-CM

## 2022-09-08 DIAGNOSIS — D6869 Other thrombophilia: Secondary | ICD-10-CM

## 2022-09-08 NOTE — Patient Instructions (Signed)
Medication Instructions:  Your physician recommends that you continue on your current medications as directed. Please refer to the Current Medication list given to you today.  *If you need a refill on your cardiac medications before your next appointment, please call your pharmacy*   Follow-Up: At [facility], you and your health needs are our priority.  As part of our continuing mission to provide you with exceptional heart care, we have created designated Provider Care Teams.  These Care Teams include your primary Cardiologist (physician) and Advanced Practice Providers (APPs -  Physician Assistants and Nurse Practitioners) who all work together to provide you with the care you need, when you need it.   Your next appointment:   1 year(s)  Provider:   Candee Furbish, MD

## 2022-09-08 NOTE — Progress Notes (Signed)
Cardiology Office Note:    Date:  09/08/2022   ID:  [name] [name], DOB 1934/01/29, MRN [id number]  PCP:  [name] Stalker, PA-C   [facility] Providers Cardiologist:  [name] Furbish, MD     Referring MD: [name] Stalker, PA-C    History of Present Illness:    [name] [name] is a 89 y.o. female here for the follow-up of permanent atrial fibrillation doing well without any fevers chills nausea vomiting syncope bleeding.  Recently moved from her home of several years in [facility] to a small [place] in [place].  Her daughter lives in [place] works for the [employer].  Her grandson [name] [name] is at [employer].  He will be going to [place] to study [employer].  She is somewhat worried about being alone.  Denies any chest pain fevers chills nausea vomiting syncope bleeding    Past Medical History:  Diagnosis Date   Acute blood loss anemia    Bilateral lower extremity edema    Chronic atrial fibrillation (HCC)    Constipation    Diabetes (HCC)    prediabetes   GERD (gastroesophageal reflux disease)    Hyperlipidemia    Hypertension    Hypokalemia    OAB (overactive bladder)    Osteoarthritis    Pressure ulcer    Protein calorie malnutrition ([place])    Spinal stenosis, thoracic    UTI (lower urinary tract infection)    Weakness of both lower extremities     Past Surgical History:  Procedure Laterality Date   BACK SURGERY     CARPAL TUNNEL RELEASE Right    CATARACT EXTRACTION  Apr 11, 2012, Jun 04 2012   HIP SURGERY     restrictor in left hip due to 3 hip dislocations   KNEE SURGERY     2 total knees -right and left   NECK SURGERY     THORACIC DISCECTOMY N/A 09/19/2015   Procedure: THORACIC LAMINECTOMY DECOMPRESSION T2-3, T3-4,T5-6;  Surgeon: Kevan Ny Ditty, MD;  Location: [facility];  Service: Neurosurgery;  Laterality: N/A;  THORACIC LAMINECTOMY DECOMPRESSION T2-3, T3-4,T5-6   TONSILLECTOMY     TOTAL HIP ARTHROPLASTY     right and left hips    WISDOM TOOTH EXTRACTION      Current Medications: Current Meds  Medication Sig   acetaminophen (TYLENOL) 325 MG tablet Take 2 tablets (650 mg total) by mouth every 4 (four) hours as needed (temp > 100.5).   bisoprolol-hydrochlorothiazide (ZIAC) 10-6.25 MG per tablet Take 1 tablet by mouth 2 (two) times daily.    Ergocalciferol (VITAMIN D2) 2000 units TABS Take 1 tablet by mouth daily.   hydrocortisone cream 1 % Apply 1 application topically daily as needed for itching.   loperamide (IMODIUM A-D) 2 MG tablet Take 4 mg by mouth 4 (four) times daily as needed for diarrhea or loose stools.   oxybutynin (DITROPAN-XL) 5 MG 24 hr tablet Take 5 mg by mouth daily.   pravastatin (PRAVACHOL) 40 MG tablet Take 40 mg by mouth daily.   ranitidine (ZANTAC) 75 MG tablet Take 75 mg by mouth daily as needed for heartburn.   warfarin (COUMADIN) 2 MG tablet Take 1.5 tablets (3 mg) by mouth daily     Allergies:   Nsaids and Statins   Social History   Socioeconomic History   Marital status: Divorced    Spouse name: Not on file   Number of children: 3   Years of education: Not on file   Highest education  level: Not on file  Occupational History   Occupation: retired  Tobacco Use   Smoking status: Former    Years: 20.00    Types: Cigarettes    Quit date: 09/15/1975    Years since quitting: 47.0   Smokeless tobacco: Never  Substance and Sexual Activity   Alcohol use: Yes    Alcohol/week: 0.0 standard drinks of alcohol    Comment: social   Drug use: No   Sexual activity: Not on file  Other Topics Concern   Not on file  Social History Narrative   Tobacco use   Cigarettes: Former smoker   Pack- year Hx 20   No smoking, History of smoking   Alcohol : yes , occasionally    Occupation : retired Cabin crew   Marital status Divorced   Children 3      Social Determinants of Radio broadcast assistant Strain: Not on file  Food Insecurity: Not on file  Transportation Needs: Not on file  Physical  Activity: Not on file  Stress: Not on file  Social Connections: Not on file     Family History: The patient's family history includes Colon cancer in her father; Heart disease in her mother; Uterine cancer (age of onset: 23) in her sister.  ROS:   Please see the history of present illness.     All other systems reviewed and are negative.  EKGs/Labs/Other Studies Reviewed:    The following studies were reviewed today:      ECHO 2013:  - Procedure narrative: Transthoracic echocardiography. Image    quality was suboptimal. The study was technically    difficult, as a result of poor sound wave transmission.  - Left ventricle: Systolic function was normal. The    estimated ejection fraction was in the range of 55% to    60%.  - Mitral valve: Calcified annulus. Mildly thickened leaflets    .  - Left atrium: The atrium was mildly dilated.  - Right atrium: The atrium was mildly dilated  EKG: 09/08/2022: 54 AFIB  Prior atrial fibrillation heart rate 75 bpm no ischemic changes  Recent Labs: No results found for requested labs within last 365 days.  Recent Lipid Panel No results found for: "CHOL", "TRIG", "HDL", "CHOLHDL", "VLDL", "LDLCALC", "LDLDIRECT"   Risk Assessment/Calculations:              Physical Exam:    VS:  BP (!) 199/94   Pulse 60   Ht 5' 7.5" (1.715 m)   Wt 223 lb (101.2 kg)   SpO2 97%   BMI 34.41 kg/m     Wt Readings from Last 3 Encounters:  09/08/22 223 lb (101.2 kg)  03/22/22 225 lb 9.6 oz (102.3 kg)  07/31/21 230 lb (104.3 kg)     GEN:  Well nourished, well developed in no acute distress HEENT: Normal NECK: No JVD; No carotid bruits LYMPHATICS: No lymphadenopathy CARDIAC: Irregularly irregular, no murmurs, no rubs, gallops RESPIRATORY:  Clear to auscultation without rales, wheezing or rhonchi  ABDOMEN: Soft, non-tender, non-distended MUSCULOSKELETAL:  No edema; No deformity  SKIN: Warm and dry NEUROLOGIC:  Alert and oriented x  3 PSYCHIATRIC:  Normal affect   ASSESSMENT:    1. Persistent atrial fibrillation ([place])   2. Essential hypertension   3. Mixed hyperlipidemia   4. Hypercoagulable state due to permanent atrial fibrillation (HCC)     PLAN:    In order of problems listed above:   Atrial fibrillation ([place]) longstanding persistent Overall doing  quite well rate controlled.  Heart rate 54 bpm today no changes made with bisoprolol.  Hypercoagulable state due to atrial fibrillation [facility]) Lab work has been followed closely with Warfarin high risk medication.  No bleeding issues.  Dr. Eddie Dibbles office had monitored in the past.  Continue with Coumadin.  HTN (hypertension) Does have a degree of whitecoat hypertension.  Usually her blood pressures are within respectable range.  We will not make any changes today.  Stable, well controlled.  On bisoprolol hydrochlorothiazide.  Continue with current medication management.  No changes made  Hyperlipidemia Currently on pravastatin 40 mg a day.  Had trouble tolerating Crestor in the past.  LDL 96.  Stable  Chest pain of uncertain etiology Could have been an episode of gastritis or GERD.  She has not had any future episodes.  Continue to monitor.  If symptoms worsen or become more worrisome we will have low threshold for further testing.           Medication Adjustments/Labs and Tests Ordered: Current medicines are reviewed at length with the patient today.  Concerns regarding medicines are outlined above.  Orders Placed This Encounter  Procedures   EKG 12-Lead   No orders of the defined types were placed in this encounter.   Patient Instructions  Medication Instructions:  Your physician recommends that you continue on your current medications as directed. Please refer to the Current Medication list given to you today.  *If you need a refill on your cardiac medications before your next appointment, please call your pharmacy*   Follow-Up: At [facility], you and your health needs are our priority.  As part of our continuing mission to provide you with exceptional heart care, we have created designated Provider Care Teams.  These Care Teams include your primary Cardiologist (physician) and Advanced Practice Providers (APPs -  Physician Assistants and Nurse Practitioners) who all work together to provide you with the care you need, when you need it.   Your next appointment:   1 year(s)  Provider:   Candee Furbish, MD     Signed, [name] Furbish, MD  09/08/2022 5:08 PM    [place]

## 2022-09-29 DIAGNOSIS — E7800 Pure hypercholesterolemia, unspecified: Secondary | ICD-10-CM | POA: Diagnosis not present

## 2022-09-29 DIAGNOSIS — Z7901 Long term (current) use of anticoagulants: Secondary | ICD-10-CM | POA: Diagnosis not present

## 2022-09-29 DIAGNOSIS — I4891 Unspecified atrial fibrillation: Secondary | ICD-10-CM | POA: Diagnosis not present

## 2022-09-29 DIAGNOSIS — I700 Atherosclerosis of aorta: Secondary | ICD-10-CM | POA: Diagnosis not present

## 2022-09-29 DIAGNOSIS — E1122 Type 2 diabetes mellitus with diabetic chronic kidney disease: Secondary | ICD-10-CM | POA: Diagnosis not present

## 2022-09-29 DIAGNOSIS — Z6835 Body mass index (BMI) 35.0-35.9, adult: Secondary | ICD-10-CM | POA: Diagnosis not present

## 2022-09-29 DIAGNOSIS — I10 Essential (primary) hypertension: Secondary | ICD-10-CM | POA: Diagnosis not present

## 2022-09-29 DIAGNOSIS — E1121 Type 2 diabetes mellitus with diabetic nephropathy: Secondary | ICD-10-CM | POA: Diagnosis not present

## 2022-09-29 DIAGNOSIS — N183 Chronic kidney disease, stage 3 unspecified: Secondary | ICD-10-CM | POA: Diagnosis not present

## 2022-09-29 DIAGNOSIS — D6869 Other thrombophilia: Secondary | ICD-10-CM | POA: Diagnosis not present

## 2022-11-09 DIAGNOSIS — S81811A Laceration without foreign body, right lower leg, initial encounter: Secondary | ICD-10-CM | POA: Diagnosis not present

## 2022-11-11 DIAGNOSIS — I4891 Unspecified atrial fibrillation: Secondary | ICD-10-CM | POA: Diagnosis not present

## 2022-11-11 DIAGNOSIS — S81811A Laceration without foreign body, right lower leg, initial encounter: Secondary | ICD-10-CM | POA: Diagnosis not present

## 2022-11-19 DIAGNOSIS — L219 Seborrheic dermatitis, unspecified: Secondary | ICD-10-CM | POA: Diagnosis not present

## 2022-11-19 DIAGNOSIS — S81811A Laceration without foreign body, right lower leg, initial encounter: Secondary | ICD-10-CM | POA: Diagnosis not present

## 2023-03-28 DIAGNOSIS — Z7901 Long term (current) use of anticoagulants: Secondary | ICD-10-CM | POA: Diagnosis not present

## 2023-03-28 DIAGNOSIS — Z0000 Encounter for general adult medical examination without abnormal findings: Secondary | ICD-10-CM | POA: Diagnosis not present

## 2023-03-28 DIAGNOSIS — Z23 Encounter for immunization: Secondary | ICD-10-CM | POA: Diagnosis not present

## 2023-03-28 DIAGNOSIS — Z6834 Body mass index (BMI) 34.0-34.9, adult: Secondary | ICD-10-CM | POA: Diagnosis not present

## 2023-03-28 DIAGNOSIS — I4891 Unspecified atrial fibrillation: Secondary | ICD-10-CM | POA: Diagnosis not present

## 2023-03-28 DIAGNOSIS — E1121 Type 2 diabetes mellitus with diabetic nephropathy: Secondary | ICD-10-CM | POA: Diagnosis not present

## 2023-06-17 DIAGNOSIS — E1121 Type 2 diabetes mellitus with diabetic nephropathy: Secondary | ICD-10-CM | POA: Diagnosis not present

## 2023-06-17 DIAGNOSIS — Z7901 Long term (current) use of anticoagulants: Secondary | ICD-10-CM | POA: Diagnosis not present

## 2023-07-06 ENCOUNTER — Telehealth: Payer: Self-pay | Admitting: Cardiology

## 2023-07-06 NOTE — Telephone Encounter (Signed)
 Left a message to call back.

## 2023-07-06 NOTE — Telephone Encounter (Signed)
 Called pt reports had a single episode of palpitations this morning.  Occurred while putting on clothes. Expressed could feel heart speed up lasted about 20 min and went away.   Denies chest discomfort.  Was able to go about her day without difficulty. Denies recent sickness.  Unable to check BP and HR lives alone and has no one to help put on cuff.  Denies missing doses of Ziac  and warfarin.  Pt request to schedule f/u OV with MD.  OV due in March no openings for this months.  OV scheduled for 07/28/23 at 11:40 am.  Advised pt to call back if continues to episodes of palpitations. Pt expresses understanding.

## 2023-07-06 NOTE — Telephone Encounter (Signed)
 Patient would like her call returned to [phone].

## 2023-07-06 NOTE — Telephone Encounter (Signed)
 Patient is returning call.

## 2023-07-06 NOTE — Telephone Encounter (Signed)
 Patient c/o Palpitations:  STAT if patient reporting lightheadedness, shortness of breath, or chest pain  How long have you had palpitations/irregular HR/ Afib? Are you having the symptoms now? One incident this morning that lasted 20-30 min  Are you currently experiencing lightheadedness, SOB or CP? Slight CP during incident but stopped  Do you have a history of afib (atrial fibrillation) or irregular heart rhythm? Yes afib  Have you checked your BP or HR? (document readings if available): no, unable to check yourself  Are you experiencing any other symptoms? no

## 2023-07-20 DIAGNOSIS — E785 Hyperlipidemia, unspecified: Secondary | ICD-10-CM | POA: Diagnosis not present

## 2023-07-20 DIAGNOSIS — Z7901 Long term (current) use of anticoagulants: Secondary | ICD-10-CM | POA: Diagnosis not present

## 2023-07-20 DIAGNOSIS — N1831 Chronic kidney disease, stage 3a: Secondary | ICD-10-CM | POA: Diagnosis not present

## 2023-07-20 DIAGNOSIS — C8400 Mycosis fungoides, unspecified site: Secondary | ICD-10-CM | POA: Diagnosis not present

## 2023-07-20 DIAGNOSIS — R32 Unspecified urinary incontinence: Secondary | ICD-10-CM | POA: Diagnosis not present

## 2023-07-20 DIAGNOSIS — I130 Hypertensive heart and chronic kidney disease with heart failure and stage 1 through stage 4 chronic kidney disease, or unspecified chronic kidney disease: Secondary | ICD-10-CM | POA: Diagnosis not present

## 2023-07-20 DIAGNOSIS — I509 Heart failure, unspecified: Secondary | ICD-10-CM | POA: Diagnosis not present

## 2023-07-20 DIAGNOSIS — Z87891 Personal history of nicotine dependence: Secondary | ICD-10-CM | POA: Diagnosis not present

## 2023-07-20 DIAGNOSIS — D6869 Other thrombophilia: Secondary | ICD-10-CM | POA: Diagnosis not present

## 2023-07-20 DIAGNOSIS — M1990 Unspecified osteoarthritis, unspecified site: Secondary | ICD-10-CM | POA: Diagnosis not present

## 2023-07-20 DIAGNOSIS — I4891 Unspecified atrial fibrillation: Secondary | ICD-10-CM | POA: Diagnosis not present

## 2023-07-26 DIAGNOSIS — I700 Atherosclerosis of aorta: Secondary | ICD-10-CM | POA: Diagnosis not present

## 2023-07-26 DIAGNOSIS — I10 Essential (primary) hypertension: Secondary | ICD-10-CM | POA: Diagnosis not present

## 2023-07-26 DIAGNOSIS — Z7901 Long term (current) use of anticoagulants: Secondary | ICD-10-CM | POA: Diagnosis not present

## 2023-07-26 DIAGNOSIS — I4891 Unspecified atrial fibrillation: Secondary | ICD-10-CM | POA: Diagnosis not present

## 2023-07-26 DIAGNOSIS — H35033 Hypertensive retinopathy, bilateral: Secondary | ICD-10-CM | POA: Diagnosis not present

## 2023-07-26 DIAGNOSIS — M79671 Pain in right foot: Secondary | ICD-10-CM | POA: Diagnosis not present

## 2023-07-26 DIAGNOSIS — N183 Chronic kidney disease, stage 3 unspecified: Secondary | ICD-10-CM | POA: Diagnosis not present

## 2023-07-26 DIAGNOSIS — D6869 Other thrombophilia: Secondary | ICD-10-CM | POA: Diagnosis not present

## 2023-07-26 DIAGNOSIS — E1121 Type 2 diabetes mellitus with diabetic nephropathy: Secondary | ICD-10-CM | POA: Diagnosis not present

## 2023-07-28 ENCOUNTER — Encounter: Payer: Self-pay | Admitting: Cardiology

## 2023-07-28 ENCOUNTER — Ambulatory Visit: Payer: Medicare HMO | Attending: Cardiology | Admitting: Cardiology

## 2023-07-28 VITALS — HR 82 | Ht 67.5 in | Wt 209.8 lb

## 2023-07-28 DIAGNOSIS — I4811 Longstanding persistent atrial fibrillation: Secondary | ICD-10-CM

## 2023-07-28 DIAGNOSIS — E782 Mixed hyperlipidemia: Secondary | ICD-10-CM

## 2023-07-28 DIAGNOSIS — I10 Essential (primary) hypertension: Secondary | ICD-10-CM

## 2023-07-28 NOTE — Patient Instructions (Signed)
Medication Instructions:  The current medical regimen is effective;  continue present plan and medications.  *If you need a refill on your cardiac medications before your next appointment, please call your pharmacy*  Follow-Up: At [facility], you and your health needs are our priority.  As part of our continuing mission to provide you with exceptional heart care, we have created designated Provider Care Teams.  These Care Teams include your primary Cardiologist (physician) and Advanced Practice Providers (APPs -  Physician Assistants and Nurse Practitioners) who all work together to provide you with the care you need, when you need it.  We recommend signing up for the patient portal called "MyChart".  Sign up information is provided on this After Visit Summary.  MyChart is used to connect with patients for Virtual Visits (Telemedicine).  Patients are able to view lab/test results, encounter notes, upcoming appointments, etc.  Non-urgent messages can be sent to your provider as well.   To learn more about what you can do with MyChart, go to [email or web address].    Your next appointment:   Follow up as needed with Dr Anne Fu

## 2023-07-28 NOTE — Progress Notes (Signed)
Cardiology Office Note:  .   Date:  07/28/2023  ID:  Di Kindle, DOB Jun 19, 1934, MRN [id number] PCP: Jarrett Soho, PA-C  [facility] Providers Cardiologist:  Donato Schultz, MD     History of Present Illness: .   [name] [name] is a 89 y.o. female Discussed with the use of AI scribe   History of Present Illness   The patient is an 89 year old female with permanent atrial fibrillation who presents for follow-up. She is accompanied by Aundra Millet, her neighbor and helper.  She has permanent atrial fibrillation, which has been stable for several years. She is on Coumadin for chronic anticoagulation and bisoprolol-hydrochlorothiazide, 10 mg/6.25 mg, taken twice daily for heart rate and blood pressure control. Her prior EKG in 2024 showed atrial fibrillation with a rate of 54 beats per minute. An echocardiogram in 2013 revealed an ejection fraction estimated at 55-60% with mitral annular calcification and mildly dilated atria. She does not monitor her blood pressure at home due to difficulty using the machine independently. No fevers, chills, syncope, or bleeding.  She is on atorvastatin, 40 mg, taken on Monday, Wednesday, and Friday nights for cholesterol management. Her LDL was 95 mg/dL in April, indicating good control.  Her hemoglobin A1c is 6.1, indicating a prediabetic state, but she is not diabetic.  She experiences occasional GERD-like symptoms, which have been managed conservatively.  General health parameters such as liver function and creatinine levels are within normal limits, with an ALT of 12 and creatinine of 1.0 from outside labs.          ROS: No syncope, no bleeding.   Studies Reviewed: Marland Kitchen   EKG Interpretation Date/Time:  Thursday July 28 2023 11:52:01 EST Ventricular Rate:  56 PR Interval:    QRS Duration:  88 QT Interval:  408 QTC Calculation: 393 R Axis:   71  Text Interpretation: Atrial fibrillation with slow ventricular response Nonspecific ST  abnormality When compared with ECG of 20-Sep-2015 11:58, Vent. rate has decreased BY  45 BPM Nonspecific T wave abnormality no longer evident in Inferior leads QT has shortened Confirmed by Donato Schultz ([id number]) on 07/28/2023 12:01:48 PM    Results   LABS LDL: 95 mg/dL (19/1478) GNF6O: 1.3% ALT: 12 U/L Creatinine: 1.0 mg/dL Hgb 14  DIAGNOSTIC Echocardiography: Ejection fraction 55-60%, mitral annular calcification, mildly dilated atria (2013) EKG: Atrial fibrillation, rate 54 bpm (09/2022)     Risk Assessment/Calculations:           Physical Exam:   VS:  Pulse 82   Ht 5' 7.5" (1.715 m)   Wt 209 lb 12.8 oz (95.2 kg)   SpO2 98%   BMI 32.37 kg/m    Wt Readings from Last 3 Encounters:  07/28/23 209 lb 12.8 oz (95.2 kg)  09/08/22 223 lb (101.2 kg)  03/22/22 225 lb 9.6 oz (102.3 kg)    GEN: Well nourished, well developed in no acute distress NECK: No JVD; No carotid bruits CARDIAC: irreg, no murmurs, no rubs, no gallops RESPIRATORY:  Clear to auscultation without rales, wheezing or rhonchi  ABDOMEN: Soft, non-tender, non-distended EXTREMITIES:  No edema; No deformity   ASSESSMENT AND PLAN: .    Assessment and Plan    Permanent Atrial Fibrillation Chronic condition with well-managed control. EKG shows atrial fibrillation with a heart rate of 54 bpm. Managed with bisoprolol and hydrochlorothiazide. No recent episodes of syncope, bleeding, or other complications. Coumadin therapy is monitored by primary care provider. Discussed and agreed on graduating  from the cardiology clinic due to consistent management by primary care provider. - Continue bisoprolol 10 mg/6.25 mg twice daily - Continue Coumadin as prescribed - Follow up with primary care provider for INR monitoring - Graduate from cardiology clinic, follow up as needed  Hyperlipidemia Managed with atorvastatin. LDL was 95 mg/dL in April. - Continue atorvastatin 40 mg on Monday, Wednesday, and  Friday  Prediabetes Hemoglobin A1c is 6.1%. No current need for diabetes medication. - Monitor hemoglobin A1c periodically  General Health Maintenance Liver function (ALT) and kidney function (creatinine) are within normal limits. - Follow up with primary care provider for routine health maintenance  Follow-up - Return to cardiology clinic if any issues arise or if referred by primary care provider. PCP may proceed with refills.               Signed, Donato Schultz, MD

## 2023-08-22 ENCOUNTER — Encounter: Payer: Self-pay | Admitting: Podiatry

## 2023-08-22 ENCOUNTER — Ambulatory Visit (INDEPENDENT_AMBULATORY_CARE_PROVIDER_SITE_OTHER): Payer: Medicare HMO

## 2023-08-22 ENCOUNTER — Ambulatory Visit (INDEPENDENT_AMBULATORY_CARE_PROVIDER_SITE_OTHER): Payer: Medicare HMO | Admitting: Podiatry

## 2023-08-22 DIAGNOSIS — M778 Other enthesopathies, not elsewhere classified: Secondary | ICD-10-CM

## 2023-08-22 DIAGNOSIS — I890 Lymphedema, not elsewhere classified: Secondary | ICD-10-CM | POA: Diagnosis not present

## 2023-08-22 NOTE — Progress Notes (Signed)
 Subjective:   Patient ID: [name] [name], female   DOB: 89 y.o.   MRN: [id number]   HPI Patient presents with pain in the right foot stating that it was very bad before it seems to be feeling quite a bit better and has a lot of swelling in her legs in general.  She presents with caregiver today does not smoke is not active   Review of Systems  All other systems reviewed and are negative.       Objective:  Physical Exam Vitals and nursing note reviewed.  Constitutional:      Appearance: She is well-developed.  Pulmonary:     Effort: Pulmonary effort is normal.  Musculoskeletal:        General: Normal range of motion.  Skin:    General: Skin is warm.  Neurological:     Mental Status: She is alert.     Neurovascular status was found to be intact muscle strength is adequate edema in both feet and into the lower leg with varicosities noted negative pitting at the current time.  Patient has good digital perfusion moderate discomfort in the dorsal right foot lateral foot but nothing of intensity currently.  Good digital perfusion well-oriented     Assessment:  Inflammatory condition with patient also having edema consistent with venous disease bilateral     Plan:  8P x-ray taken reviewed and I discussed compression treatment anti-inflammatories stretching and physical therapy.  At this point patient will continue on her current course and may require treatment for 1 of these conditions  X-rays were negative for signs of fracture does indicate mild arthritis no other pathology noted

## 2023-10-28 DIAGNOSIS — Z7901 Long term (current) use of anticoagulants: Secondary | ICD-10-CM | POA: Diagnosis not present

## 2023-11-09 DIAGNOSIS — I4891 Unspecified atrial fibrillation: Secondary | ICD-10-CM | POA: Diagnosis not present

## 2023-11-09 DIAGNOSIS — R233 Spontaneous ecchymoses: Secondary | ICD-10-CM | POA: Diagnosis not present

## 2023-11-09 DIAGNOSIS — Z7901 Long term (current) use of anticoagulants: Secondary | ICD-10-CM | POA: Diagnosis not present

## 2023-11-09 DIAGNOSIS — R195 Other fecal abnormalities: Secondary | ICD-10-CM | POA: Diagnosis not present

## 2023-12-26 DIAGNOSIS — N183 Chronic kidney disease, stage 3 unspecified: Secondary | ICD-10-CM | POA: Diagnosis not present

## 2023-12-26 DIAGNOSIS — H35033 Hypertensive retinopathy, bilateral: Secondary | ICD-10-CM | POA: Diagnosis not present

## 2023-12-26 DIAGNOSIS — I4891 Unspecified atrial fibrillation: Secondary | ICD-10-CM | POA: Diagnosis not present

## 2024-01-26 DIAGNOSIS — N183 Chronic kidney disease, stage 3 unspecified: Secondary | ICD-10-CM | POA: Diagnosis not present

## 2024-01-26 DIAGNOSIS — H35033 Hypertensive retinopathy, bilateral: Secondary | ICD-10-CM | POA: Diagnosis not present

## 2024-01-26 DIAGNOSIS — I4891 Unspecified atrial fibrillation: Secondary | ICD-10-CM | POA: Diagnosis not present

## 2024-01-27 DIAGNOSIS — R413 Other amnesia: Secondary | ICD-10-CM | POA: Diagnosis not present

## 2024-02-26 DIAGNOSIS — H35033 Hypertensive retinopathy, bilateral: Secondary | ICD-10-CM | POA: Diagnosis not present

## 2024-02-26 DIAGNOSIS — I4891 Unspecified atrial fibrillation: Secondary | ICD-10-CM | POA: Diagnosis not present

## 2024-02-26 DIAGNOSIS — N183 Chronic kidney disease, stage 3 unspecified: Secondary | ICD-10-CM | POA: Diagnosis not present

## 2024-03-23 ENCOUNTER — Emergency Department (HOSPITAL_COMMUNITY)

## 2024-03-23 ENCOUNTER — Emergency Department (HOSPITAL_COMMUNITY)
Admission: EM | Admit: 2024-03-23 | Discharge: 2024-03-23 | Disposition: A | Discharge status: Home / Self Care | Attending: Emergency Medicine | Admitting: Emergency Medicine

## 2024-03-23 DIAGNOSIS — S0990XA Unspecified injury of head, initial encounter: Secondary | ICD-10-CM | POA: Diagnosis not present

## 2024-03-23 DIAGNOSIS — S0083XA Contusion of other part of head, initial encounter: Secondary | ICD-10-CM | POA: Diagnosis not present

## 2024-03-23 DIAGNOSIS — W19XXXA Unspecified fall, initial encounter: Secondary | ICD-10-CM | POA: Diagnosis not present

## 2024-03-23 DIAGNOSIS — W01198A Fall on same level from slipping, tripping and stumbling with subsequent striking against other object, initial encounter: Secondary | ICD-10-CM | POA: Insufficient documentation

## 2024-03-23 DIAGNOSIS — I700 Atherosclerosis of aorta: Secondary | ICD-10-CM | POA: Diagnosis not present

## 2024-03-23 DIAGNOSIS — S40021A Contusion of right upper arm, initial encounter: Secondary | ICD-10-CM | POA: Insufficient documentation

## 2024-03-23 DIAGNOSIS — Y92009 Unspecified place in unspecified non-institutional (private) residence as the place of occurrence of the external cause: Secondary | ICD-10-CM | POA: Diagnosis not present

## 2024-03-23 DIAGNOSIS — Z79899 Other long term (current) drug therapy: Secondary | ICD-10-CM | POA: Diagnosis not present

## 2024-03-23 DIAGNOSIS — K449 Diaphragmatic hernia without obstruction or gangrene: Secondary | ICD-10-CM | POA: Diagnosis not present

## 2024-03-23 DIAGNOSIS — M47814 Spondylosis without myelopathy or radiculopathy, thoracic region: Secondary | ICD-10-CM | POA: Diagnosis not present

## 2024-03-23 DIAGNOSIS — I4891 Unspecified atrial fibrillation: Secondary | ICD-10-CM | POA: Diagnosis not present

## 2024-03-23 DIAGNOSIS — S0993XA Unspecified injury of face, initial encounter: Secondary | ICD-10-CM | POA: Diagnosis not present

## 2024-03-23 DIAGNOSIS — M47812 Spondylosis without myelopathy or radiculopathy, cervical region: Secondary | ICD-10-CM | POA: Diagnosis not present

## 2024-03-23 DIAGNOSIS — I10 Essential (primary) hypertension: Secondary | ICD-10-CM | POA: Insufficient documentation

## 2024-03-23 DIAGNOSIS — S299XXA Unspecified injury of thorax, initial encounter: Secondary | ICD-10-CM | POA: Diagnosis not present

## 2024-03-23 DIAGNOSIS — E119 Type 2 diabetes mellitus without complications: Secondary | ICD-10-CM | POA: Insufficient documentation

## 2024-03-23 DIAGNOSIS — Z7901 Long term (current) use of anticoagulants: Secondary | ICD-10-CM | POA: Insufficient documentation

## 2024-03-23 DIAGNOSIS — Z981 Arthrodesis status: Secondary | ICD-10-CM | POA: Diagnosis not present

## 2024-03-23 DIAGNOSIS — Z743 Need for continuous supervision: Secondary | ICD-10-CM | POA: Diagnosis not present

## 2024-03-23 LAB — I-STAT CHEM 8, ED
BUN: 18 mg/dL (ref 8–23)
Calcium, Ion: 1.09 mmol/L — ABNORMAL LOW (ref 1.15–1.40)
Chloride: 103 mmol/L (ref 98–111)
Creatinine, Ser: 1.1 mg/dL — ABNORMAL HIGH (ref 0.44–1.00)
Glucose, Bld: 148 mg/dL — ABNORMAL HIGH (ref 70–99)
HCT: 41 % (ref 36.0–46.0)
Hemoglobin: 13.9 g/dL (ref 12.0–15.0)
Potassium: 3.7 mmol/L (ref 3.5–5.1)
Sodium: 139 mmol/L (ref 135–145)
TCO2: 21 mmol/L — ABNORMAL LOW (ref 22–32)

## 2024-03-23 LAB — CBC WITH DIFFERENTIAL/PLATELET
Abs Immature Granulocytes: 0.03 K/uL (ref 0.00–0.07)
Basophils Absolute: 0.1 K/uL (ref 0.0–0.1)
Basophils Relative: 1 %
Eosinophils Absolute: 0.2 K/uL (ref 0.0–0.5)
Eosinophils Relative: 2 %
HCT: 41.1 % (ref 36.0–46.0)
Hemoglobin: 13.1 g/dL (ref 12.0–15.0)
Immature Granulocytes: 0 %
Lymphocytes Relative: 11 %
Lymphs Abs: 0.9 K/uL (ref 0.7–4.0)
MCH: 30.5 pg (ref 26.0–34.0)
MCHC: 31.9 g/dL (ref 30.0–36.0)
MCV: 95.6 fL (ref 80.0–100.0)
Monocytes Absolute: 0.7 K/uL (ref 0.1–1.0)
Monocytes Relative: 9 %
Neutro Abs: 6.2 K/uL (ref 1.7–7.7)
Neutrophils Relative %: 77 %
Platelets: 228 K/uL (ref 150–400)
RBC: 4.3 MIL/uL (ref 3.87–5.11)
RDW: 13.8 % (ref 11.5–15.5)
WBC: 8 K/uL (ref 4.0–10.5)
nRBC: 0 % (ref 0.0–0.2)

## 2024-03-23 LAB — BASIC METABOLIC PANEL WITH GFR
Anion gap: 13 (ref 5–15)
BUN: 16 mg/dL (ref 8–23)
CO2: 22 mmol/L (ref 22–32)
Calcium: 9.2 mg/dL (ref 8.9–10.3)
Chloride: 103 mmol/L (ref 98–111)
Creatinine, Ser: 0.94 mg/dL (ref 0.44–1.00)
GFR, Estimated: 59 mL/min — ABNORMAL LOW (ref >60)
Glucose, Bld: 142 mg/dL — ABNORMAL HIGH (ref 70–99)
Potassium: 3.8 mmol/L (ref 3.5–5.1)
Sodium: 138 mmol/L (ref 135–145)

## 2024-03-23 LAB — I-STAT CG4 LACTIC ACID, ED: Lactic Acid, Venous: 2.8 mmol/L (ref 0.5–1.9)

## 2024-03-23 LAB — PROTIME-INR
INR: 1.7 — ABNORMAL HIGH (ref 0.8–1.2)
Prothrombin Time: 20.7 s — ABNORMAL HIGH (ref 11.4–15.2)

## 2024-03-23 MED ORDER — SODIUM CHLORIDE 0.9 % IV BOLUS: 500.0000 mL | Freq: Once | INTRAVENOUS | Status: DC

## 2024-03-23 NOTE — Discharge Instructions (Signed)
 Follow-up with your primary care doctor for recheck in the next few days.  I think you already have an appointment on Friday which is okay.  However you need to let them know that your INR was lower than I would anticipate they would like to keep it.  It was 1.7.  Return to the emergency room if you have any worsening symptoms.

## 2024-03-23 NOTE — ED Notes (Signed)
 Pt transported to CT by myself at this time.

## 2024-03-23 NOTE — ED Triage Notes (Signed)
 Pt arrives via EMS from home after a mechanical fall this evening where she fell backwards and hit her head on her stove. Hematoma above the R eye noted, Pt denies LOC, takes warfarin. States she woke up on the floor and doesn't remember falling.  Hx of spinal surgeries and pt reporting neck pain. Arrived in C-collar placed by EMS.

## 2024-03-23 NOTE — ED Provider Notes (Signed)
 [facility] AT [facility] Provider Note   CSN: [id number] Arrival date & time: 03/23/24  2039     Patient presents with: Fall   [name] [name] is a 89 y.o. female.   Patient is a 89 year old who presents as a level 2 trauma after a fall.  She was at home and was turning around suddenly and says her foot slipped and she fell, hitting her head on the ground or the oven door.  She denies any loss of consciousness.  She has a bruise above her right eye.  She denies any other injuries.  She has neck pain but she says it is more from the collar that is on there rather than actual neck pain.  She denies any numbness or weakness in her extremities.  She has a history of atrial fibrillation, hypertension, hyperlipidemia, diabetes.  She is on Coumadin .       Prior to Admission medications   Medication Sig Start Date End Date Taking? Authorizing Provider  acetaminophen  (TYLENOL ) 325 MG tablet Take 2 tablets (650 mg total) by mouth every 4 (four) hours as needed (temp > 100.5). 09/23/15   Levern Beckey HERO, MD  betamethasone dipropionate 0.05 % lotion Apply 1 Application topically daily as needed. 05/25/23   [provider]  bisoprolol -hydrochlorothiazide  (ZIAC ) 10-6.25 MG per tablet Take 1 tablet by mouth 2 (two) times daily.     [provider]  Ergocalciferol  (VITAMIN D2) 2000 units TABS Take 1 tablet by mouth daily.    [provider]  hydrocortisone cream 1 % Apply 1 application topically daily as needed for itching.    [provider]  loperamide (IMODIUM A-D) 2 MG tablet Take 4 mg by mouth 4 (four) times daily as needed for diarrhea or loose stools.    [provider]  oxybutynin  (DITROPAN -XL) 5 MG 24 hr tablet Take 5 mg by mouth daily.    [provider]  pravastatin (PRAVACHOL) 40 MG tablet Take 40 mg by mouth daily. 05/15/20   [provider]  ranitidine (ZANTAC) 75 MG tablet Take 75 mg by mouth  daily as needed for heartburn.    [provider]  warfarin (COUMADIN ) 2 MG tablet Take 1.5 tablets (3 mg) by mouth daily 07/05/17   [provider]    Allergies: Nsaids and Statins    Review of Systems  Constitutional:  Negative for fatigue and fever.  HENT:  Negative for nosebleeds.   Respiratory:  Negative for shortness of breath.   Cardiovascular:  Negative for chest pain.  Gastrointestinal:  Negative for abdominal pain, nausea and vomiting.  Genitourinary:  Negative for dysuria.  Musculoskeletal:  Positive for neck pain. Negative for arthralgias and back pain.  Skin:  Negative for wound.  Neurological:  Negative for dizziness, weakness, light-headedness, numbness and headaches.    Updated Vital Signs BP (!) 144/88 (BP Location: Right Arm)   Pulse 82   Temp 98.8 F (37.1 C) (Oral)   Resp 16   Ht 5' 7 (1.702 m)   Wt 85.9 kg   SpO2 100%   BMI 29.66 kg/m   Physical Exam Constitutional:      Appearance: She is well-developed.  HENT:     Head: Normocephalic.     Comments: Small area of ecchymosis just above the right eye.  No bony tenderness to the face.  Extraocular eye movements intact.    Mouth/Throat:     Mouth: Mucous membranes are moist.  Eyes:  Extraocular Movements: Extraocular movements intact.     Pupils: Pupils are equal, round, and reactive to light.  Neck:     Comments: C-collar in place.  No bony tenderness of cervical spine.  There are some mild tenderness to the lower thoracic spine.  No step-offs or deformities.  No pain to the lumbosacral spine. Cardiovascular:     Rate and Rhythm: Normal rate and regular rhythm.     Heart sounds: Normal heart sounds.  Pulmonary:     Effort: Pulmonary effort is normal. No respiratory distress.     Breath sounds: Normal breath sounds. No wheezing or rales.  Chest:     Chest wall: No tenderness.  Abdominal:     General: Bowel sounds are normal.     Palpations: Abdomen is soft.     Tenderness:  There is no abdominal tenderness. There is no guarding or rebound.  Musculoskeletal:        General: Normal range of motion.     Comments: No pain on palpation or range of motion of the extremities.  There are some small areas of ecchymosis to the posterior right upper arm but no underlying bony tenderness  Lymphadenopathy:     Cervical: No cervical adenopathy.  Skin:    General: Skin is warm and dry.     Findings: No rash.  Neurological:     General: No focal deficit present.     Mental Status: She is alert and oriented to person, place, and time.     (all labs ordered are listed, but only abnormal results are displayed) Labs Reviewed  BASIC METABOLIC PANEL WITH GFR - Abnormal; Notable for the following components:      Result Value   Glucose, Bld 142 (*)    GFR, Estimated 59 (*)    All other components within normal limits  PROTIME-INR - Abnormal; Notable for the following components:   Prothrombin Time 20.7 (*)    INR 1.7 (*)    All other components within normal limits  I-STAT CHEM 8, ED - Abnormal; Notable for the following components:   Creatinine, Ser 1.10 (*)    Glucose, Bld 148 (*)    Calcium , Ion 1.09 (*)    TCO2 21 (*)    All other components within normal limits  I-STAT CG4 LACTIC ACID, ED - Abnormal; Notable for the following components:   Lactic Acid, Venous 2.8 (*)    All other components within normal limits  CBC WITH DIFFERENTIAL/PLATELET    EKG: None  Radiology: CT Thoracic Spine Wo Contrast Result Date: 03/23/2024 CLINICAL DATA:  Clemens, back trauma EXAM: CT THORACIC SPINE WITHOUT CONTRAST TECHNIQUE: Multidetector CT images of the thoracic were obtained using the standard protocol without intravenous contrast. RADIATION DOSE REDUCTION: This exam was performed according to the departmental dose-optimization program which includes automated exposure control, adjustment of the mA and/or kV according to patient size and/or use of iterative reconstruction  technique. COMPARISON:  09/17/2015 FINDINGS: Alignment: Alignment is grossly anatomic. Vertebrae: No acute or destructive bony abnormalities. Prior T3 laminectomy. Prior T12-L1 fusion. Paraspinal and other soft tissues: The paraspinal soft tissues are unremarkable. Small hiatal hernia incidentally noted. Atherosclerosis of the aorta and coronary vasculature. Disc levels: There is partial bony fusion across the T2-3 and T3-4 disc spaces. Marked anterior osteophyte formation is seen throughout the thoracolumbar spine, greatest from T5-6 through T7-8. Extensive multilevel facet hypertrophic changes. Reconstructed images demonstrate no additional findings. IMPRESSION: 1. No acute thoracic spine fracture. 2. Postsurgical changes as  above. 3. Severe multilevel thoracic spondylosis and facet hypertrophy. Electronically Signed   By: Ozell Daring M.D.   On: 03/23/2024 21:34   CT Cervical Spine Wo Contrast Result Date: 03/23/2024 CLINICAL DATA:  Clemens EXAM: CT CERVICAL SPINE WITHOUT CONTRAST TECHNIQUE: Multidetector CT imaging of the cervical spine was performed without intravenous contrast. Multiplanar CT image reconstructions were also generated. RADIATION DOSE REDUCTION: This exam was performed according to the departmental dose-optimization program which includes automated exposure control, adjustment of the mA and/or kV according to patient size and/or use of iterative reconstruction technique. COMPARISON:  09/19/2015 FINDINGS: Alignment: Alignment is grossly anatomic. Skull base and vertebrae: No acute fracture. No primary bone lesion or focal pathologic process. Soft tissues and spinal canal: No prevertebral fluid or swelling. No visible canal hematoma. Disc levels: Prior C4-C6 ACDF. There is severe spondylosis at C2-3, C3-4, and C6-7. Moderate diffuse facet hypertrophic changes greatest at the C7-T1 level. Upper chest: Airway is patent.  Lung apices clear. Other: Reconstructed images demonstrate no additional  findings. IMPRESSION: 1. No acute cervical spine fracture. 2. Prior C4-C6 ACDF. 3. Diffuse multilevel cervical degenerative changes as above. Electronically Signed   By: Ozell Daring M.D.   On: 03/23/2024 21:30   CT Head Wo Contrast Result Date: 03/23/2024 CLINICAL DATA:  Clemens, head trauma EXAM: CT HEAD WITHOUT CONTRAST TECHNIQUE: Contiguous axial images were obtained from the base of the skull through the vertex without intravenous contrast. RADIATION DOSE REDUCTION: This exam was performed according to the departmental dose-optimization program which includes automated exposure control, adjustment of the mA and/or kV according to patient size and/or use of iterative reconstruction technique. COMPARISON:  11/10/2021 FINDINGS: Brain: Diffuse cerebral atrophy. No evidence of acute infarct or hemorrhage. Lateral ventricles and midline structures are unremarkable. No acute extra-axial fluid collections. No mass effect. Vascular: No hyperdense vessel or unexpected calcification. Skull: Bilateral supraorbital scalp hematomas, right greater than left. No underlying fracture. The remainder of the calvarium is unremarkable. Sinuses/Orbits: No acute finding. Other: None. IMPRESSION: 1. Bilateral frontal scalp hematomas.  No underlying fractures. 2. No acute intracranial process. Electronically Signed   By: Ozell Daring M.D.   On: 03/23/2024 21:28     Procedures   Medications Ordered in the ED  sodium chloride  0.9 % bolus 500 mL (has no administration in time range)                                    Medical Decision Making Amount and/or Complexity of Data Reviewed Labs: ordered. Radiology: ordered.   Patient is a 89 year old female who presents as a level 2 trauma.  She had a mechanical fall.  She has some ecchymosis over her right eye.  No evident eye involvement.  She had CT scan of her head, cervical spine and thoracic spine.  This does not show any evidence of acute traumatic injuries.  She is  well-appearing and is asking to go home.  Blood work is nonconcerning.  Her lactate initially was a little elevated but she does not have a fever or other concerns for infection.  She is currently asymptomatic and is anxious to leave.  She does not report any recent illnesses.  She was discharged home in good condition.  She has a follow-up appointment this week with her primary care doctor.  Advised her that she needs to have her INR rechecked as it may be a little bit on the low  side.  It was 1.7.  She was discharged home in good condition.  Return precautions were given.     Final diagnoses:  Contusion of face, initial encounter  Fall, initial encounter    ED Discharge Orders     None          Lenor Hollering, MD 03/23/24 2231

## 2024-03-23 NOTE — Progress Notes (Signed)
 Orthopedic Tech Progress Note Patient Details:  [name] [name] Mar 16, 1934 [id number]  Patient ID: [name], female   DOB: 07-14-1933, 89 y.o.   MRN: [id number] LV2T FOT. No orders at this time. Giovanni LITTIE Lukes 03/23/2024, 8:57 PM

## 2024-03-26 DIAGNOSIS — S0083XA Contusion of other part of head, initial encounter: Secondary | ICD-10-CM | POA: Diagnosis not present

## 2024-03-27 DIAGNOSIS — N183 Chronic kidney disease, stage 3 unspecified: Secondary | ICD-10-CM | POA: Diagnosis not present

## 2024-03-27 DIAGNOSIS — H35033 Hypertensive retinopathy, bilateral: Secondary | ICD-10-CM | POA: Diagnosis not present

## 2024-03-27 DIAGNOSIS — I4891 Unspecified atrial fibrillation: Secondary | ICD-10-CM | POA: Diagnosis not present

## 2024-03-29 DIAGNOSIS — R58 Hemorrhage, not elsewhere classified: Secondary | ICD-10-CM | POA: Diagnosis not present

## 2024-04-02 DIAGNOSIS — N3281 Overactive bladder: Secondary | ICD-10-CM | POA: Diagnosis not present

## 2024-04-02 DIAGNOSIS — Z7901 Long term (current) use of anticoagulants: Secondary | ICD-10-CM | POA: Diagnosis not present

## 2024-04-02 DIAGNOSIS — N183 Chronic kidney disease, stage 3 unspecified: Secondary | ICD-10-CM | POA: Diagnosis not present

## 2024-04-02 DIAGNOSIS — R58 Hemorrhage, not elsewhere classified: Secondary | ICD-10-CM | POA: Diagnosis not present

## 2024-04-02 DIAGNOSIS — H35033 Hypertensive retinopathy, bilateral: Secondary | ICD-10-CM | POA: Diagnosis not present

## 2024-04-02 DIAGNOSIS — Z23 Encounter for immunization: Secondary | ICD-10-CM | POA: Diagnosis not present

## 2024-04-02 DIAGNOSIS — E7800 Pure hypercholesterolemia, unspecified: Secondary | ICD-10-CM | POA: Diagnosis not present

## 2024-04-02 DIAGNOSIS — I4891 Unspecified atrial fibrillation: Secondary | ICD-10-CM | POA: Diagnosis not present

## 2024-04-02 DIAGNOSIS — E1121 Type 2 diabetes mellitus with diabetic nephropathy: Secondary | ICD-10-CM | POA: Diagnosis not present

## 2024-04-02 DIAGNOSIS — Z0000 Encounter for general adult medical examination without abnormal findings: Secondary | ICD-10-CM | POA: Diagnosis not present

## 2024-04-27 DIAGNOSIS — I4891 Unspecified atrial fibrillation: Secondary | ICD-10-CM | POA: Diagnosis not present

## 2024-04-27 DIAGNOSIS — H35033 Hypertensive retinopathy, bilateral: Secondary | ICD-10-CM | POA: Diagnosis not present

## 2024-04-27 DIAGNOSIS — N183 Chronic kidney disease, stage 3 unspecified: Secondary | ICD-10-CM | POA: Diagnosis not present

## 2024-05-27 DIAGNOSIS — N183 Chronic kidney disease, stage 3 unspecified: Secondary | ICD-10-CM | POA: Diagnosis not present

## 2024-05-27 DIAGNOSIS — I4891 Unspecified atrial fibrillation: Secondary | ICD-10-CM | POA: Diagnosis not present

## 2024-05-27 DIAGNOSIS — H35033 Hypertensive retinopathy, bilateral: Secondary | ICD-10-CM | POA: Diagnosis not present

## 2024-06-12 ENCOUNTER — Encounter: Payer: Self-pay | Admitting: Neurology

## 2024-06-12 ENCOUNTER — Ambulatory Visit: Admitting: Neurology

## 2024-06-12 VITALS — BP 115/72 | HR 67 | Ht 67.5 in | Wt 199.0 lb

## 2024-06-12 DIAGNOSIS — R413 Other amnesia: Secondary | ICD-10-CM | POA: Diagnosis not present

## 2024-06-12 DIAGNOSIS — R269 Unspecified abnormalities of gait and mobility: Secondary | ICD-10-CM

## 2024-06-12 NOTE — Progress Notes (Unsigned)
 Chief Complaint  Patient presents with   New Patient (Initial Visit)    Pt in room 14. [name] [name] friend in room.NP Paper referral for Memory changes. MOCA:23      ASSESSMENT AND PLAN  [name] [name] is a 89 y.o. female   Cognitive impairment  MoCA examination 23/30, missed a 5 out of 5 recall,  CT head in September 2025 showed no acute abnormalities  Do raise the concern of central nervous system degenerative disorder, laboratory evaluation showed no treatable etiology, agree ATN profile  Significant gait abnormality  History of extensive spine surgery, distal leg weakness, left more than right,  Will refer her to home physical therapy,   I do worry about her ability to stay independent due to her profound gait abnormality, fall risk, on Coumadin  treatment, slow worsening cognitive impairment, will try to contact her daughter for discussion after ATN profile resulted    DIAGNOSTIC DATA (LABS, IMAGING, TESTING) - I reviewed patient records, labs, notes, testing and imaging myself where available.   MEDICAL HISTORY:  [name] [name], is a 89 year old female accompanied by her neighbor Matilde, seen in request by her primary care following [facility]  Katina Pfeiffer, evaluation of memory loss, gait abnormality, initial evaluation June 12, 2024, accompanied by her neighbor at visit    History is obtained from the patient and review of electronic medical records. I personally reviewed pertinent available imaging films in PACS.   PMHx of  HLD Lumbar decompression surgery A fib-on chronic coumadin  Hx of bilateral hip replacement Thoracic decompression  Cervical decompression surgery.  She only has 1 surviving daughter living [place], lost 2 children, she lives alone, has very helpful neighbor involved in her care  She is a retired veterinary surgeon, used to enjoying play bridges, travel, was active in church choir  She has gradual worsening gait abnormality, due to her extensive  spine history, had a history of lumbar, thoracic, cervical decompression surgery, but she could not not even remember she had a neck surgery in the past, she has been ambulate with walker for few years,  Her daughter who lives in [place] urged her to seeking medical attention for her memory loss, she tends to repeat conversations, , MoCA examination 23/30 today, missed a 5 out of 5 recall, there was no family history of dementia,  She fell in September 2025, had a sets of CT scan, personally reviewed the film, CT head without contrast no acute abnormality, diffuse cerebral atrophy. CT cervical spine, prior  C4-6 ACDF, diffuse multilevel degenerative changes CT thoracic, primary T3 laminectomy, T12 to L1 fusion, severe multilevel thoracic spondylosis facet hypertrophy  MRI of the brain May 2023, generalized atrophy,  MRA of the brain/neck, no large vessel disease  Laboratory evaluations in September 2025: INR 1.7, normal CBC, CMP, with mildly low iron calcium  1.09, normal TSH B12  PHYSICAL EXAM:   Vitals:   06/12/24 1515  BP: 115/72  Pulse: 67  SpO2: 98%  Weight: 199 lb (90.3 kg)  Height: 5' 7.5 (1.715 m)     Body mass index is 30.71 kg/m.  PHYSICAL EXAMNIATION:  Gen: NAD, conversant, well nourised, well groomed                     Cardiovascular: Regular rate rhythm, no peripheral edema, warm, nontender. Eyes: Conjunctivae clear without exudates or hemorrhage Neck: Supple, no carotid bruits. Pulmonary: Clear to auscultation bilaterally   NEUROLOGICAL EXAM:  MENTAL STATUS: Speech/cognition: Awake, alert, oriented to history  taking and casual conversation    06/12/2024    3:20 PM  Montreal Cognitive Assessment   Visuospatial/ Executive (0/5) 4  Naming (0/3) 3  Attention: Read list of digits (0/2) 2  Attention: Read list of letters (0/1) 1  Attention: Serial 7 subtraction starting at 100 (0/3) 3  Language: Repeat phrase (0/2) 2  Language : Fluency (0/1) 0   Abstraction (0/2) 2  Delayed Recall (0/5) 0  Orientation (0/6) 6  Total 23    CRANIAL NERVES: CN II: Visual fields are full to confrontation. Pupils are round equal and briskly reactive to light. CN III, IV, VI: extraocular movement are normal. No ptosis. CN V: Facial sensation is intact to light touch CN VII: Face is symmetric with normal eye closure  CN VIII: Hearing is normal to causal conversation. CN IX, X: Phonation is normal. CN XI: Head turning and shoulder shrug are intact  MOTOR: Limited range of motion of bilateral shoulder, bilateral intrinsic hand muscle atrophy moderate finger abduction grip weakness, mild to moderate bilateral hip flexion weakness, difficulty extending left leg, moderate bilateral ankle dorsiflexion weakness  REFLEXES: Reflexes are hypoactive and symmetric   SENSORY: Mild length-dependent decreased light touch pinprick to mid shin level  COORDINATION: There is no trunk or limb dysmetria noted.  GAIT/STANCE: Push-up rely on her walker, hyperextending her left knee, bilateral foot drop  REVIEW OF SYSTEMS:  Full 14 system review of systems performed and notable only for as above All other review of systems were negative.   ALLERGIES: Allergies[1]  HOME MEDICATIONS: Current Outpatient Medications  Medication Sig Dispense Refill   acetaminophen  (TYLENOL ) 325 MG tablet Take 2 tablets (650 mg total) by mouth every 4 (four) hours as needed (temp > 100.5). 30 tablet 0   betamethasone dipropionate 0.05 % lotion Apply 1 Application topically daily as needed.     bisoprolol -hydrochlorothiazide  (ZIAC ) 10-6.25 MG per tablet Take 1 tablet by mouth 2 (two) times daily.      Ergocalciferol  (VITAMIN D2) 2000 units TABS Take 1 tablet by mouth daily.     hydrocortisone cream 1 % Apply 1 application topically daily as needed for itching.     loperamide (IMODIUM A-D) 2 MG tablet Take 4 mg by mouth 4 (four) times daily as needed for diarrhea or loose stools.      oxybutynin  (DITROPAN -XL) 5 MG 24 hr tablet Take 5 mg by mouth daily.     pravastatin (PRAVACHOL) 40 MG tablet Take 40 mg by mouth daily.     ranitidine (ZANTAC) 75 MG tablet Take 75 mg by mouth daily as needed for heartburn.     warfarin (COUMADIN ) 2 MG tablet Take 1.5 tablets (3 mg) by mouth daily  0   No current facility-administered medications for this visit.    PAST MEDICAL HISTORY: Past Medical History:  Diagnosis Date   Acute blood loss anemia    Bilateral lower extremity edema    Chronic atrial fibrillation (HCC)    Constipation    Diabetes (HCC)    prediabetes   GERD (gastroesophageal reflux disease)    Hyperlipidemia    Hypertension    Hypokalemia    OAB (overactive bladder)    Osteoarthritis    Pressure ulcer    Protein calorie malnutrition    Spinal stenosis, thoracic    UTI (lower urinary tract infection)    Weakness of both lower extremities     PAST SURGICAL HISTORY: Past Surgical History:  Procedure Laterality Date   BACK SURGERY  CARPAL TUNNEL RELEASE Right    CATARACT EXTRACTION  Apr 11, 2012, Jun 04 2012   HIP SURGERY     restrictor in left hip due to 3 hip dislocations   KNEE SURGERY     2 total knees -right and left   NECK SURGERY     THORACIC DISCECTOMY N/A 09/19/2015   Procedure: THORACIC LAMINECTOMY DECOMPRESSION T2-3, T3-4,T5-6;  Surgeon: Morene Hicks Ditty, MD;  Location: [facility];  Service: Neurosurgery;  Laterality: N/A;  THORACIC LAMINECTOMY DECOMPRESSION T2-3, T3-4,T5-6   TONSILLECTOMY     TOTAL HIP ARTHROPLASTY     right and left hips   WISDOM TOOTH EXTRACTION      FAMILY HISTORY: Family History  Problem Relation Age of Onset   Heart disease Mother    Colon cancer Father        dx in his late 58's, died age 26   Uterine cancer Sister 69       spread to colon    SOCIAL HISTORY: Social History   Socioeconomic History   Marital status: Divorced    Spouse name: Not on file   Number of children: 3   Years of education:  Not on file   Highest education level: Not on file  Occupational History   Occupation: retired  Tobacco Use   Smoking status: Former    Current packs/day: 0.00    Types: Cigarettes    Start date: 09/15/1955    Quit date: 09/15/1975    Years since quitting: 48.7   Smokeless tobacco: Never  Vaping Use   Vaping status: Never Used  Substance and Sexual Activity   Alcohol use: Yes    Alcohol/week: 0.0 standard drinks of alcohol    Comment: social   Drug use: No   Sexual activity: Not on file  Other Topics Concern   Not on file  Social History Narrative   Tobacco use   Cigarettes: Former smoker   Pack- year Hx 20   No smoking, History of smoking   Alcohol : yes , occasionally    Occupation : retired veterinary surgeon   Marital status Divorced   Children 3      Social Drivers of Health   Tobacco Use: Medium Risk (06/12/2024)   Patient History    Smoking Tobacco Use: Former    Smokeless Tobacco Use: Never    Passive Exposure: Not on Actuary Strain: Not on file  Food Insecurity: Not on file  Transportation Needs: Not on file  Physical Activity: Not on file  Stress: Not on file  Social Connections: Not on file  Intimate Partner Violence: Not on file  Depression (EYV7-0): Not on file  Alcohol Screen: Not on file  Housing: Not on file  Utilities: Not on file  Health Literacy: Not on file      Modena Callander, M.D. Ph.D.  [facility] [address] [place], [place] [address] Ph: [phone] Fax: [phone]  CC:  Katina Pfeiffer, PA-C [address] [place],  [place] [address]  Katina Pfeiffer, PA-C       [1]  Allergies Allergen Reactions   Nsaids     On coumadin    Statins     Cause leg cramps

## 2024-06-13 ENCOUNTER — Encounter: Payer: Self-pay | Admitting: Neurology

## 2024-06-13 ENCOUNTER — Telehealth: Payer: Self-pay | Admitting: Neurology

## 2024-06-13 NOTE — Telephone Encounter (Signed)
 [facility] is going to take this patient.

## 2024-06-15 ENCOUNTER — Telehealth: Payer: Self-pay | Admitting: Neurology

## 2024-06-15 DIAGNOSIS — R413 Other amnesia: Secondary | ICD-10-CM

## 2024-06-15 DIAGNOSIS — R269 Unspecified abnormalities of gait and mobility: Secondary | ICD-10-CM

## 2024-06-15 LAB — ATN PROFILE
A -- Beta-amyloid 42/40 Ratio: 0.108
Beta-amyloid 40: 253.58 pg/mL
Beta-amyloid 42: 27.28 pg/mL
N -- NfL, Plasma: 4.96 pg/mL (ref 0.00–9.13)
T -- p-tau181: 2 pg/mL — AB (ref 0.00–0.97)

## 2024-06-15 NOTE — Telephone Encounter (Signed)
 I was able to talk with her daughter Devere at [phone],  Updated on her office visit June 12, 2024, MoCA examination 23/30 missed 5 out of 5 recall  A-T+N -profile, elevated P tau 181, the result are not consistent with the presence of Alzheimer's related pathology  But with her age, significant short-term memory loss, could not rule out possibility of Alzheimer's, agreed upon brain amyloid scan,  Devere  will be in the [place]  around February, 2026, she will contact office for appointment with Ms Toops to go over workup result  Ms Foushee has slow worsening gait abnormality, memory loss, is at high risk for fall, injury, I would suggest more assistant to her daily activity, she now live independent

## 2024-06-26 ENCOUNTER — Encounter: Payer: Self-pay | Admitting: Neurology

## 2024-06-26 ENCOUNTER — Telehealth: Payer: Self-pay | Admitting: Neurology

## 2024-06-26 NOTE — Telephone Encounter (Signed)
 Her insurance denied the pet scan: Your doctor told us  that there is a concern related to your recall (memory). An imaging study  was asked for. We cannot approve this request because: Imaging can be done if treatment involves amyloid reducing medications. The notes sent to us  do  not describe this treatment.  I can try to submit an appeal if you update her notes, thanks.

## 2024-07-03 ENCOUNTER — Encounter (HOSPITAL_COMMUNITY): Payer: Self-pay

## 2024-07-11 NOTE — Telephone Encounter (Signed)
 Called and spoke with Dana and approved orders.

## 2024-07-11 NOTE — Telephone Encounter (Signed)
 [facility] called to request verbal  for Patient PT  Frequency   2x-2w 2x-4w 1x-2w  Callback is  [phone]
